# Patient Record
Sex: Female | Born: 1980 | Race: White | Hispanic: No | Marital: Married | Smoking: Former smoker
Health system: Southern US, Community
[De-identification: ages and names within clinical notes are randomized; demographics above are authoritative.]

## PROBLEM LIST (undated history)

## (undated) DIAGNOSIS — N369 Urethral disorder, unspecified: Secondary | ICD-10-CM

## (undated) DIAGNOSIS — N3010 Interstitial cystitis (chronic) without hematuria: Secondary | ICD-10-CM

## (undated) DIAGNOSIS — F329 Major depressive disorder, single episode, unspecified: Secondary | ICD-10-CM

## (undated) DIAGNOSIS — N870 Mild cervical dysplasia: Secondary | ICD-10-CM

## (undated) DIAGNOSIS — K219 Gastro-esophageal reflux disease without esophagitis: Secondary | ICD-10-CM

## (undated) DIAGNOSIS — F419 Anxiety disorder, unspecified: Secondary | ICD-10-CM

## (undated) DIAGNOSIS — F32A Depression, unspecified: Secondary | ICD-10-CM

## (undated) DIAGNOSIS — F988 Other specified behavioral and emotional disorders with onset usually occurring in childhood and adolescence: Secondary | ICD-10-CM

## (undated) HISTORY — DX: Depression, unspecified: F32.A

## (undated) HISTORY — DX: Major depressive disorder, single episode, unspecified: F32.9

## (undated) HISTORY — DX: Interstitial cystitis (chronic) without hematuria: N30.10

## (undated) HISTORY — DX: Anxiety disorder, unspecified: F41.9

## (undated) HISTORY — PX: COLPOSCOPY: SHX161

## (undated) HISTORY — DX: Mild cervical dysplasia: N87.0

## (undated) HISTORY — PX: RHINOPLASTY: SUR1284

## (undated) HISTORY — PX: TONSILLECTOMY: SUR1361

---

## 1998-05-31 ENCOUNTER — Other Ambulatory Visit: Admission: RE | Admit: 1998-05-31 | Discharge: 1998-05-31 | Payer: Self-pay | Admitting: Gynecology

## 1999-07-02 ENCOUNTER — Other Ambulatory Visit: Admission: RE | Admit: 1999-07-02 | Discharge: 1999-07-02 | Payer: Self-pay | Admitting: Gynecology

## 2000-07-13 ENCOUNTER — Other Ambulatory Visit: Admission: RE | Admit: 2000-07-13 | Discharge: 2000-07-13 | Payer: Self-pay | Admitting: Gynecology

## 2001-05-25 ENCOUNTER — Encounter: Payer: Self-pay | Admitting: Gynecology

## 2001-05-25 ENCOUNTER — Encounter: Admission: RE | Admit: 2001-05-25 | Discharge: 2001-05-25 | Payer: Self-pay | Admitting: Gynecology

## 2001-06-21 ENCOUNTER — Encounter: Payer: Self-pay | Admitting: Surgery

## 2001-06-21 ENCOUNTER — Encounter: Admission: RE | Admit: 2001-06-21 | Discharge: 2001-06-21 | Payer: Self-pay | Admitting: Surgery

## 2001-07-13 ENCOUNTER — Other Ambulatory Visit: Admission: RE | Admit: 2001-07-13 | Discharge: 2001-07-13 | Payer: Self-pay | Admitting: Gynecology

## 2001-09-03 ENCOUNTER — Ambulatory Visit (HOSPITAL_BASED_OUTPATIENT_CLINIC_OR_DEPARTMENT_OTHER): Admission: RE | Admit: 2001-09-03 | Discharge: 2001-09-03 | Payer: Self-pay | Admitting: Internal Medicine

## 2002-09-02 ENCOUNTER — Other Ambulatory Visit: Admission: RE | Admit: 2002-09-02 | Discharge: 2002-09-02 | Payer: Self-pay | Admitting: Gynecology

## 2003-06-22 ENCOUNTER — Other Ambulatory Visit: Admission: RE | Admit: 2003-06-22 | Discharge: 2003-06-22 | Payer: Self-pay | Admitting: Gynecology

## 2004-02-28 ENCOUNTER — Other Ambulatory Visit: Admission: RE | Admit: 2004-02-28 | Discharge: 2004-02-28 | Payer: Self-pay | Admitting: Gynecology

## 2004-10-21 ENCOUNTER — Other Ambulatory Visit: Admission: RE | Admit: 2004-10-21 | Discharge: 2004-10-21 | Payer: Self-pay | Admitting: Gynecology

## 2005-03-04 ENCOUNTER — Other Ambulatory Visit: Admission: RE | Admit: 2005-03-04 | Discharge: 2005-03-04 | Payer: Self-pay | Admitting: Gynecology

## 2005-07-15 ENCOUNTER — Ambulatory Visit: Payer: Self-pay | Admitting: Internal Medicine

## 2005-07-17 ENCOUNTER — Ambulatory Visit: Payer: Self-pay | Admitting: Cardiology

## 2006-06-23 ENCOUNTER — Other Ambulatory Visit: Admission: RE | Admit: 2006-06-23 | Discharge: 2006-06-23 | Payer: Self-pay | Admitting: Obstetrics and Gynecology

## 2007-08-26 ENCOUNTER — Other Ambulatory Visit: Admission: RE | Admit: 2007-08-26 | Discharge: 2007-08-26 | Payer: Self-pay | Admitting: Obstetrics and Gynecology

## 2008-01-19 ENCOUNTER — Ambulatory Visit: Payer: Self-pay | Admitting: Obstetrics and Gynecology

## 2009-03-04 ENCOUNTER — Emergency Department (HOSPITAL_COMMUNITY): Admission: EM | Admit: 2009-03-04 | Discharge: 2009-03-04 | Payer: Self-pay | Admitting: Emergency Medicine

## 2009-03-13 ENCOUNTER — Emergency Department (HOSPITAL_COMMUNITY): Admission: EM | Admit: 2009-03-13 | Discharge: 2009-03-13 | Payer: Self-pay | Admitting: Emergency Medicine

## 2009-04-05 ENCOUNTER — Ambulatory Visit: Payer: Self-pay | Admitting: Obstetrics and Gynecology

## 2009-04-05 ENCOUNTER — Other Ambulatory Visit: Admission: RE | Admit: 2009-04-05 | Discharge: 2009-04-05 | Payer: Self-pay | Admitting: Obstetrics and Gynecology

## 2010-04-09 ENCOUNTER — Other Ambulatory Visit (HOSPITAL_COMMUNITY)
Admission: RE | Admit: 2010-04-09 | Payer: PRIVATE HEALTH INSURANCE | Source: Ambulatory Visit | Admitting: Obstetrics and Gynecology

## 2010-04-09 ENCOUNTER — Other Ambulatory Visit
Admission: RE | Admit: 2010-04-09 | Discharge: 2010-04-09 | Payer: Self-pay | Source: Home / Self Care | Admitting: Obstetrics and Gynecology

## 2010-04-09 ENCOUNTER — Ambulatory Visit
Admission: RE | Admit: 2010-04-09 | Discharge: 2010-04-09 | Payer: Self-pay | Source: Home / Self Care | Attending: Obstetrics and Gynecology | Admitting: Obstetrics and Gynecology

## 2010-04-09 ENCOUNTER — Other Ambulatory Visit (HOSPITAL_COMMUNITY)
Admission: RE | Admit: 2010-04-09 | Discharge: 2010-04-09 | Disposition: A | Discharge status: Home / Self Care | Payer: PRIVATE HEALTH INSURANCE | Source: Ambulatory Visit | Attending: Obstetrics and Gynecology | Admitting: Obstetrics and Gynecology

## 2010-04-09 ENCOUNTER — Other Ambulatory Visit: Payer: Self-pay | Admitting: Obstetrics and Gynecology

## 2010-04-09 DIAGNOSIS — Z124 Encounter for screening for malignant neoplasm of cervix: Secondary | ICD-10-CM | POA: Insufficient documentation

## 2010-06-17 LAB — POCT URINALYSIS DIP (DEVICE)
Bilirubin Urine: NEGATIVE
Glucose, UA: NEGATIVE mg/dL
Glucose, UA: NEGATIVE mg/dL
Ketones, ur: NEGATIVE mg/dL
Nitrite: NEGATIVE
Nitrite: POSITIVE — AB
Protein, ur: 30 mg/dL — AB
Protein, ur: >=300 mg/dL — AB
Specific Gravity, Urine: 1.01 (ref 1.005–1.030)
Specific Gravity, Urine: >=1.03 (ref 1.005–1.030)
Urobilinogen, UA: 0.2 mg/dL (ref 0.0–1.0)
Urobilinogen, UA: 0.2 mg/dL (ref 0.0–1.0)
pH: 5.5 (ref 5.0–8.0)
pH: 5.5 (ref 5.0–8.0)

## 2010-06-17 LAB — URINE CULTURE: Colony Count: NO GROWTH

## 2010-06-17 LAB — POCT PREGNANCY, URINE: Preg Test, Ur: NEGATIVE

## 2011-03-21 ENCOUNTER — Telehealth: Payer: Self-pay | Admitting: *Deleted

## 2011-03-21 MED ORDER — NORGESTREL-ETHINYL ESTRADIOL 0.3-30 MG-MCG PO TABS: 1.0000 | ORAL_TABLET | Freq: Every day | ORAL | Status: DC

## 2011-03-21 NOTE — Telephone Encounter (Signed)
Pt called wanting refill on birth control Lo/Ovral 28 tab. Her annual is due Jan, but pt wont be able to make until Feb? Lm for pt to call.

## 2011-03-21 NOTE — Telephone Encounter (Signed)
Pt called back with appointment date and time 2/14 @3 :30 pm, rx will be sent to pharmacy.

## 2011-04-08 DIAGNOSIS — N870 Mild cervical dysplasia: Secondary | ICD-10-CM | POA: Insufficient documentation

## 2011-04-08 DIAGNOSIS — F319 Bipolar disorder, unspecified: Secondary | ICD-10-CM | POA: Insufficient documentation

## 2011-04-21 ENCOUNTER — Other Ambulatory Visit: Payer: Self-pay | Admitting: *Deleted

## 2011-04-21 MED ORDER — NORGESTREL-ETHINYL ESTRADIOL 0.3-30 MG-MCG PO TABS: 1.0000 | ORAL_TABLET | Freq: Every day | ORAL | Status: DC

## 2011-04-21 NOTE — Telephone Encounter (Signed)
Pt called need 1 pack of birth control filled appointment for annual is Feb 14. rx sent to pharmacy.

## 2011-05-01 ENCOUNTER — Encounter: Payer: Self-pay | Admitting: Obstetrics and Gynecology

## 2011-05-01 ENCOUNTER — Ambulatory Visit (INDEPENDENT_AMBULATORY_CARE_PROVIDER_SITE_OTHER): Payer: PRIVATE HEALTH INSURANCE | Admitting: Obstetrics and Gynecology

## 2011-05-01 ENCOUNTER — Other Ambulatory Visit (HOSPITAL_COMMUNITY)
Admission: RE | Admit: 2011-05-01 | Discharge: 2011-05-01 | Disposition: A | Discharge status: Home / Self Care | Payer: PRIVATE HEALTH INSURANCE | Source: Ambulatory Visit | Attending: Obstetrics and Gynecology | Admitting: Obstetrics and Gynecology

## 2011-05-01 DIAGNOSIS — Z113 Encounter for screening for infections with a predominantly sexual mode of transmission: Secondary | ICD-10-CM

## 2011-05-01 DIAGNOSIS — Z01419 Encounter for gynecological examination (general) (routine) without abnormal findings: Secondary | ICD-10-CM | POA: Insufficient documentation

## 2011-05-01 LAB — URINALYSIS W MICROSCOPIC + REFLEX CULTURE
Hgb urine dipstick: NEGATIVE
Leukocytes, UA: NEGATIVE
Nitrite: NEGATIVE
Protein, ur: NEGATIVE mg/dL

## 2011-05-01 MED ORDER — NORGESTREL-ETHINYL ESTRADIOL 0.3-30 MG-MCG PO TABS: 1.0000 | ORAL_TABLET | Freq: Every day | ORAL | Status: DC

## 2011-05-01 NOTE — Progress Notes (Signed)
Patient came to see me today for her annual GYN exam. She remains happy on her birth control pills. 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 She has noticed some diminished libido. She is on a variety of drugs by Dr. Andee Poles which can affect libido. She is having no pelvic pain. She is having no abnormal bleeding.  Physical examination: Kennon Portela present. HEENT within normal limits. Neck: Thyroid not large. No masses. Supraclavicular nodes: not enlarged. Breasts: Examined in both sitting midline position. No skin changes and no masses. Abdomen: Soft no guarding rebound or masses or hernia. Pelvic: External: Within normal limits. BUS: Within normal limits. Vaginal:within normal limits. Good estrogen effect. No evidence of cystocele rectocele or enterocele. Cervix: clean. Uterus: Normal size and shape. Adnexa: No masses. Rectovaginal exam: Confirmatory and negative. Extremities: Within normal limits.  Assessment: Normal GYN exam. History of CIN-1. Diminished libido.  Plan: Patient will discuss her libido with Dr. Nolen Mu. We also discussed a Mirena IUD but I don't believe this is hormonal. The patient liked the thought of an IUD however. She will read about it-literature given. She will inform [place] if she wants to switch. Her birth control pills were refilled.

## 2011-05-09 ENCOUNTER — Telehealth: Payer: Self-pay | Admitting: *Deleted

## 2011-05-09 NOTE — Telephone Encounter (Signed)
Patient informed, will stick with birth control pills for now.

## 2011-05-09 NOTE — Telephone Encounter (Signed)
Message copied by Libby Maw on Fri May 09, 2011 10:36 AM ------      Message from: Aura Camps      Created: Wed May 07, 2011  9:22 AM       Hey Mychal Decarlo            This pt called wanting to know if benefits could be checked for Mirena IUD. Dr.G spoke with her about this at last office visit call back # [phone]            Thanks             Victorino Dike

## 2011-05-09 NOTE — Telephone Encounter (Signed)
Lm for patient to call about Mirena IUD benefits.  All charges would go to $2500 deductable.

## 2012-03-03 ENCOUNTER — Ambulatory Visit (INDEPENDENT_AMBULATORY_CARE_PROVIDER_SITE_OTHER): Payer: PRIVATE HEALTH INSURANCE | Admitting: Gynecology

## 2012-03-03 ENCOUNTER — Encounter: Payer: Self-pay | Admitting: Gynecology

## 2012-03-03 VITALS — BP 132/84

## 2012-03-03 DIAGNOSIS — N920 Excessive and frequent menstruation with regular cycle: Secondary | ICD-10-CM

## 2012-03-03 DIAGNOSIS — Z3041 Encounter for surveillance of contraceptive pills: Secondary | ICD-10-CM

## 2012-03-03 DIAGNOSIS — N946 Dysmenorrhea, unspecified: Secondary | ICD-10-CM

## 2012-03-03 MED ORDER — NORGESTREL-ETHINYL ESTRADIOL 0.3-30 MG-MCG PO TABS: ORAL_TABLET | ORAL | Status: DC

## 2012-03-03 NOTE — Progress Notes (Signed)
Patient is a 31 year old gravida 0 who presented to the office today to discuss contraceptive options. Patient is currently on norgestrel-ethinyl estradiol and states that a few days before her period starts she'll spot for a few days. She had her annual exam earlier this year and my partner had discussed with her about the possibility of using a Mirena IUD for contraception. At that [employer] did not cover for the IUD and  she had decided at that point to continue on the oral contraceptive pill. We discussed the continuous regimen of taking the oral contraceptive pill whereby she would withdraw every 3 months. She would like to proceed with this regimen until she can find out if her insurance will cover for the Mirena IUD. The risks benefits and pros and cons of both options discussed. She's otherwise scheduled to return to the office integrity see her for her annual exam.

## 2012-03-03 NOTE — Patient Instructions (Addendum)
Intrauterine Device Information  An intrauterine device (IUD) is inserted into your uterus and prevents pregnancy. There are 2 types of IUDs available:  · Copper IUD. This type of IUD is wrapped in copper wire and is placed inside the uterus. Copper makes the uterus and fallopian tubes produce a fluid that kills sperm. The copper IUD can stay in place for 10 years.  · Hormone IUD. This type of IUD contains the hormone progestin (synthetic progesterone). The hormone thickens the cervical mucus and prevents sperm from entering the uterus, and it also thins the uterine lining to prevent implantation of a fertilized egg. The hormone can weaken or kill the sperm that get into the uterus. The hormone IUD can stay in place for 5 years.  Your caregiver will make sure you are a good candidate for a contraceptive IUD. Discuss with your caregiver the possible side effects.  ADVANTAGES  · It is highly effective, reversible, long-acting, and low maintenance.  · There are no estrogen-related side effects.  · An IUD can be used when breastfeeding.  · It is not associated with weight gain.  · It works immediately after insertion.  · The copper IUD does not interfere with your female hormones.  · The progesterone IUD can make heavy menstrual periods lighter.  · The progesterone IUD can be used for 5 years.  · The copper IUD can be used for 10 years.  DISADVANTAGES  · The progesterone IUD can be associated with irregular bleeding patterns.  · The copper IUD can make your menstrual flow heavier and more painful.  · You may experience cramping and vaginal bleeding after insertion.  Document Released: 02/05/2004 Document Revised: 05/26/2011 Document Reviewed: 07/06/2010  ExitCare® Patient Information ©2013 [employer].

## 2012-03-04 ENCOUNTER — Other Ambulatory Visit: Payer: Self-pay | Admitting: Gynecology

## 2012-03-04 ENCOUNTER — Telehealth: Payer: Self-pay | Admitting: Gynecology

## 2012-03-04 DIAGNOSIS — Z3049 Encounter for surveillance of other contraceptives: Secondary | ICD-10-CM

## 2012-03-04 MED ORDER — LEVONORGESTREL 20 MCG/24HR IU IUD: INTRAUTERINE_SYSTEM | Freq: Once | INTRAUTERINE | Status: DC

## 2012-03-04 NOTE — Telephone Encounter (Signed)
Pt was advised that the Mirena & insertion were covered with $30 copay since her deductible is met for 2013. Appt set with JF for 03/08/12 at 2:20pm/WL

## 2012-03-08 ENCOUNTER — Encounter: Payer: Self-pay | Admitting: Gynecology

## 2012-03-08 ENCOUNTER — Ambulatory Visit (INDEPENDENT_AMBULATORY_CARE_PROVIDER_SITE_OTHER): Payer: PRIVATE HEALTH INSURANCE | Admitting: Gynecology

## 2012-03-08 VITALS — BP 132/84

## 2012-03-08 DIAGNOSIS — Z30430 Encounter for insertion of intrauterine contraceptive device: Secondary | ICD-10-CM

## 2012-03-08 NOTE — Patient Instructions (Signed)
Intrauterine Device Information  An intrauterine device (IUD) is inserted into your uterus and prevents pregnancy. There are 2 types of IUDs available:  · Copper IUD. This type of IUD is wrapped in copper wire and is placed inside the uterus. Copper makes the uterus and fallopian tubes produce a fluid that kills sperm. The copper IUD can stay in place for 10 years.  · Hormone IUD. This type of IUD contains the hormone progestin (synthetic progesterone). The hormone thickens the cervical mucus and prevents sperm from entering the uterus, and it also thins the uterine lining to prevent implantation of a fertilized egg. The hormone can weaken or kill the sperm that get into the uterus. The hormone IUD can stay in place for 5 years.  Your caregiver will make sure you are a good candidate for a contraceptive IUD. Discuss with your caregiver the possible side effects.  ADVANTAGES  · It is highly effective, reversible, long-acting, and low maintenance.  · There are no estrogen-related side effects.  · An IUD can be used when breastfeeding.  · It is not associated with weight gain.  · It works immediately after insertion.  · The copper IUD does not interfere with your female hormones.  · The progesterone IUD can make heavy menstrual periods lighter.  · The progesterone IUD can be used for 5 years.  · The copper IUD can be used for 10 years.  DISADVANTAGES  · The progesterone IUD can be associated with irregular bleeding patterns.  · The copper IUD can make your menstrual flow heavier and more painful.  · You may experience cramping and vaginal bleeding after insertion.  Document Released: 02/05/2004 Document Revised: 05/26/2011 Document Reviewed: 07/06/2010  ExitCare® Patient Information ©2013 [employer].

## 2012-03-08 NOTE — Progress Notes (Signed)
Patient presented to the office today for placement of her Mirena IUD. See previous encounter note dated December 18 for further detail. Patient read the literature information previously provided. Patient fully where this form of contraception is 99% effective and is good for 5 years. Patient finished her menstrual cycle 4 days ago and started her oral contraceptive pill 3 days ago.  Exam: Bartholin urethra Skene was within normal limits Vagina: No lesions or discharge Cervix: No lesions or discharge Uterus: Anteverted normal size shape and consistency Adnexa: No palpable masses or tenderness Rectal exam: Not done  Procedure note: The cervix was cleansed with Betadine solution. A single-tooth tenaculum was placed on the anterior cervical lip. The uterus sounded to 7 cm. The Mirena IUD was placed in sterile fashion and the string was trimmed. The single-tooth tenaculum was removed. Patient tolerated procedure well.  Assessment/plan: Patient had a Mirena IUD inserted today. Patient will return back in one month for followup. She was instructed to discontinue her oral contraceptive pill. She was given Aleve for cramping in the office.

## 2012-04-08 ENCOUNTER — Encounter: Payer: Self-pay | Admitting: Gynecology

## 2012-04-08 ENCOUNTER — Ambulatory Visit (INDEPENDENT_AMBULATORY_CARE_PROVIDER_SITE_OTHER): Payer: PRIVATE HEALTH INSURANCE | Admitting: Gynecology

## 2012-04-08 VITALS — BP 128/82

## 2012-04-08 DIAGNOSIS — J069 Acute upper respiratory infection, unspecified: Secondary | ICD-10-CM

## 2012-04-08 DIAGNOSIS — Z30431 Encounter for routine checking of intrauterine contraceptive device: Secondary | ICD-10-CM

## 2012-04-08 MED ORDER — HYDROCOD POLST-CHLORPHEN POLST 10-8 MG/5ML PO LQCR: 5.0000 mL | Freq: Two times a day (BID) | ORAL | Status: DC

## 2012-04-08 MED ORDER — FLUCONAZOLE 100 MG PO TABS: ORAL_TABLET | ORAL | Status: DC

## 2012-04-08 MED ORDER — AZITHROMYCIN 250 MG PO TABS: ORAL_TABLET | ORAL | Status: DC

## 2012-04-08 NOTE — Progress Notes (Signed)
Patient presented to the office today for one month followup after having placed a Mirena IUD. Patient has done well. The past few days patient's complaint of postnasal drip sinus congestion and a nonproductive cough.  Exam: Lungs clear to auscultation Rogers or wheezes HEENT: Tender maxillary sinuses no submandibular lymphadenopathy.  Pelvic exam: Bartholin urethra Skene was within normal limits Vagina: No lesions or discharge Cervix: IUD string was seen Uterus: Anteverted normal size shape and consistency nontender Adnexa: No palpable masses or tenderness rectal exam: Not done  Assessment/plan: Upper respiratory tract infection/sinusitis. Patient will be placed on a Z-Pak and a prescription for Tussionex 5 cc every 12 hours for cough suppressant. She scheduled to return back next month for her annual exam. Since patient susceptible to antibiotics and developed these infection a prescription of Diflucan 100 mg was called in as well.

## 2012-04-20 ENCOUNTER — Ambulatory Visit (INDEPENDENT_AMBULATORY_CARE_PROVIDER_SITE_OTHER): Payer: PRIVATE HEALTH INSURANCE | Admitting: Emergency Medicine

## 2012-04-20 ENCOUNTER — Ambulatory Visit: Payer: PRIVATE HEALTH INSURANCE

## 2012-04-20 VITALS — BP 112/76 | HR 96 | Temp 98.1°F | Resp 16 | Ht 65.0 in | Wt 174.0 lb

## 2012-04-20 DIAGNOSIS — M25569 Pain in unspecified knee: Secondary | ICD-10-CM

## 2012-04-20 MED ORDER — NAPROXEN SODIUM 550 MG PO TABS: 550.0000 mg | ORAL_TABLET | Freq: Two times a day (BID) | ORAL | Status: DC

## 2012-04-20 NOTE — Progress Notes (Signed)
  Subjective:    Patient ID: Abigail Higgins, female    DOB: May 23, 1980, 32 y.o.   MRN: [id number]  HPI    Review of Systems     Objective:   Physical Exam        Assessment & Plan:    [facility] reading (PRIMARY) by  Dr. Dareen Piano. Negative knee.

## 2012-04-20 NOTE — Patient Instructions (Addendum)
Knee Pain The knee is the complex joint between your thigh and your lower leg. It is made up of bones, tendons, ligaments, and cartilage. The bones that make up the knee are:  The femur in the thigh.  The tibia and fibula in the lower leg.  The patella or kneecap riding in the groove on the lower femur. CAUSES  Knee pain is a common complaint with many causes. A few of these causes are:  Injury, such as:  A ruptured ligament or tendon injury.  Torn cartilage.  Medical conditions, such as:  Gout  Arthritis  Infections  Overuse, over training or overdoing a physical activity. Knee pain can be minor or severe. Knee pain can accompany debilitating injury. Minor knee problems often respond well to self-care measures or get well on their own. More serious injuries may need medical intervention or even surgery. SYMPTOMS The knee is complex. Symptoms of knee problems can vary widely. Some of the problems are:  Pain with movement and weight bearing.  Swelling and tenderness.  Buckling of the knee.  Inability to straighten or extend your knee.  Your knee locks and you cannot straighten it.  Warmth and redness with pain and fever.  Deformity or dislocation of the kneecap. DIAGNOSIS  Determining what is wrong may be very straight forward such as when there is an injury. It can also be challenging because of the complexity of the knee. Tests to make a diagnosis may include:  Your caregiver taking a history and doing a physical exam.  Routine X-rays can be used to rule out other problems. X-rays will not reveal a cartilage tear. Some injuries of the knee can be diagnosed by:  Arthroscopy a surgical technique by which a small video camera is inserted through tiny incisions on the sides of the knee. This procedure is used to examine and repair internal knee joint problems. Tiny instruments can be used during arthroscopy to repair the torn knee cartilage (meniscus).  Arthrography  is a radiology technique. A contrast liquid is directly injected into the knee joint. Internal structures of the knee joint then become visible on X-ray film.  An MRI scan is a non x-ray radiology procedure in which magnetic fields and a computer produce two- or three-dimensional images of the inside of the knee. Cartilage tears are often visible using an MRI scanner. MRI scans have largely replaced arthrography in diagnosing cartilage tears of the knee.  Blood work.  Examination of the fluid that helps to lubricate the knee joint (synovial fluid). This is done by taking a sample out using a needle and a syringe. TREATMENT The treatment of knee problems depends on the cause. Some of these treatments are:  Depending on the injury, proper casting, splinting, surgery or physical therapy care will be needed.  Give yourself adequate recovery time. Do not overuse your joints. If you begin to get sore during workout routines, back off. Slow down or do fewer repetitions.  For repetitive activities such as cycling or running, maintain your strength and nutrition.  Alternate muscle groups. For example if you are a weight lifter, work the upper body on one day and the lower body the next.  Either tight or weak muscles do not give the proper support for your knee. Tight or weak muscles do not absorb the stress placed on the knee joint. Keep the muscles surrounding the knee strong.  Take care of mechanical problems.  If you have flat feet, orthotics or special shoes may help.   See your caregiver if you need help.  Arch supports, sometimes with wedges on the inner or outer aspect of the heel, can help. These can shift pressure away from the side of the knee most bothered by osteoarthritis.  A brace called an "unloader" brace also may be used to help ease the pressure on the most arthritic side of the knee.  If your caregiver has prescribed crutches, braces, wraps or ice, use as directed. The acronym for  this is PRICE. This means protection, rest, ice, compression and elevation.  Nonsteroidal anti-inflammatory drugs (NSAID's), can help relieve pain. But if taken immediately after an injury, they may actually increase swelling. Take NSAID's with food in your stomach. Stop them if you develop stomach problems. Do not take these if you have a history of ulcers, stomach pain or bleeding from the bowel. Do not take without your caregiver's approval if you have problems with fluid retention, heart failure, or kidney problems.  For ongoing knee problems, physical therapy may be helpful.  Glucosamine and chondroitin are over-the-counter dietary supplements. Both may help relieve the pain of osteoarthritis in the knee. These medicines are different from the usual anti-inflammatory drugs. Glucosamine may decrease the rate of cartilage destruction.  Injections of a corticosteroid drug into your knee joint may help reduce the symptoms of an arthritis flare-up. They may provide pain relief that lasts a few months. You may have to wait a few months between injections. The injections do have a small increased risk of infection, water retention and elevated blood sugar levels.  Hyaluronic acid injected into damaged joints may ease pain and provide lubrication. These injections may work by reducing inflammation. A series of shots may give relief for as long as 6 months.  Topical painkillers. Applying certain ointments to your skin may help relieve the pain and stiffness of osteoarthritis. Ask your pharmacist for suggestions. Many over the-counter products are approved for temporary relief of arthritis pain.  In some countries, doctors often prescribe topical NSAID's for relief of chronic conditions such as arthritis and tendinitis. A review of treatment with NSAID creams found that they worked as well as oral medications but without the serious side effects. PREVENTION  Maintain a healthy weight. Extra pounds put  more strain on your joints.  Get strong, stay limber. Weak muscles are a common cause of knee injuries. Stretching is important. Include flexibility exercises in your workouts.  Be smart about exercise. If you have osteoarthritis, chronic knee pain or recurring injuries, you may need to change the way you exercise. This does not mean you have to stop being active. If your knees ache after jogging or playing basketball, consider switching to swimming, water aerobics or other low-impact activities, at least for a few days a week. Sometimes limiting high-impact activities will provide relief.  Make sure your shoes fit well. Choose footwear that is right for your sport.  Protect your knees. Use the proper gear for knee-sensitive activities. Use kneepads when playing volleyball or laying carpet. Buckle your seat belt every time you drive. Most shattered kneecaps occur in car accidents.  Rest when you are tired. SEEK MEDICAL CARE IF:  You have knee pain that is continual and does not seem to be getting better.  SEEK IMMEDIATE MEDICAL CARE IF:  Your knee joint feels hot to the touch and you have a high fever. MAKE SURE YOU:   Understand these instructions.  Will watch your condition.  Will get help right away if you are not   doing well or get worse. Document Released: 12/29/2006 Document Revised: 05/26/2011 Document Reviewed: 12/29/2006 ExitCare Patient Information 2013 [employer].  

## 2012-04-20 NOTE — Progress Notes (Signed)
[facility] [address], [place] [address] [phone]  Date:  04/20/2012   Name:  Abigail Higgins   DOB:  06-21-1980   MRN:  [id number]  PCP:  Tonye Pearson, MD    Chief Complaint: Knee Injury   History of Present Illness:  Abigail Higgins is a 31 y.o. very pleasant female patient who presents with the following:  Injured right knee in a fall on the stairs when she missed a step and fell down the last three steps.  Happened last Tuesday and still has pain.  No improvement with OTC meds.  Unable to comfortably bear weight.  No other complaint of injury.  Patient Active Problem List  Diagnosis  . Depression  . CIN I (cervical intraepithelial neoplasia I)    Past Medical History  Diagnosis Date  . Depression   . CIN I (cervical intraepithelial neoplasia I)     Past Surgical History  Procedure Date  . Tonsillectomy   . Rhinoplasty   . Colposcopy     History  Substance Use Topics  . Smoking status: Current Every Day Smoker -- 0.5 packs/day    Types: Cigarettes  . Smokeless tobacco: Not on file  . Alcohol Use: No    Family History  Problem Relation Age of Onset  . Adopted: Yes    No Known Allergies  Medication list has been reviewed and updated.  Current Outpatient Prescriptions on File Prior to Visit  Medication Sig Dispense Refill  . ALPRAZolam (XANAX) 0.25 MG tablet Take 0.25 mg by mouth at bedtime as needed.      . ARIPiprazole (ABILIFY) 10 MG tablet Take 10 mg by mouth daily.      . ClonazePAM (KLONOPIN PO) Take by mouth.      . lamoTRIgine (LAMICTAL) 100 MG tablet Take 100 mg by mouth daily.       Marland Kitchen levonorgestrel (MIRENA) 20 MCG/24HR IUD 1 each by Intrauterine route once.      . lisdexamfetamine (VYVANSE) 50 MG capsule Take 50 mg by mouth every morning.      Marland Kitchen QUEtiapine Fumarate (SEROQUEL PO) Take by mouth.      Marland Kitchen azithromycin (ZITHROMAX) 250 MG tablet Take two tablets today then one PO BID  6 each  0  .  chlorpheniramine-HYDROcodone (TUSSIONEX) 10-8 MG/5ML LQCR Take 5 mLs by mouth every 12 (twelve) hours.  140 mL  0  . fluconazole (DIFLUCAN) 100 MG tablet Take one tablet and repeat in 48 hours if needed  2 tablet  1  . norgestrel-ethinyl estradiol (LO/OVRAL,CRYSELLE) 0.3-30 MG-MCG tablet Please take one continuously  and withdraw every three months. The first and second packs discard the last seven tablets.  3 Package  4    Review of Systems:  As per HPI, otherwise negative.    Physical Examination: Filed Vitals:   04/20/12 1356  BP: 112/76  Pulse: 96  Temp: 98.1 F (36.7 C)  Resp: 16   Filed Vitals:   04/20/12 1356  Height: 5\' 5"  (1.651 m)  Weight: 174 lb (78.926 kg)   Body mass index is 28.96 kg/(m^2). Ideal Body Weight: Weight in (lb) to have BMI = 25: 149.9    GEN: WDWN, NAD, Non-toxic, Alert & Oriented x 3 HEENT: Atraumatic, Normocephalic.  Ears and Nose: No external deformity. EXTR: No clubbing/cyanosis/edema NEURO: Normal gait.  PSYCH: Normally interactive. Conversant. Not depressed or anxious appearing.  Calm demeanor.  RIGHT Knee:  Tender medially.  No effusion or ecchymosis.  Joint stable.  Assessment and Plan: Contusion knee Anaprox Follow up as needed Refused crutches Follow up in one week  Carmelina Dane, MD

## 2012-04-21 ENCOUNTER — Encounter: Payer: Self-pay | Admitting: Emergency Medicine

## 2012-05-03 ENCOUNTER — Encounter: Payer: PRIVATE HEALTH INSURANCE | Admitting: Gynecology

## 2012-05-18 ENCOUNTER — Encounter: Payer: Self-pay | Admitting: Gynecology

## 2012-05-18 ENCOUNTER — Ambulatory Visit (INDEPENDENT_AMBULATORY_CARE_PROVIDER_SITE_OTHER): Payer: PRIVATE HEALTH INSURANCE | Admitting: Gynecology

## 2012-05-18 VITALS — BP 128/76 | Ht 65.0 in | Wt 164.0 lb

## 2012-05-18 DIAGNOSIS — Z23 Encounter for immunization: Secondary | ICD-10-CM

## 2012-05-18 DIAGNOSIS — Z0289 Encounter for other administrative examinations: Secondary | ICD-10-CM

## 2012-05-18 DIAGNOSIS — R635 Abnormal weight gain: Secondary | ICD-10-CM | POA: Insufficient documentation

## 2012-05-18 DIAGNOSIS — Z01419 Encounter for gynecological examination (general) (routine) without abnormal findings: Secondary | ICD-10-CM

## 2012-05-18 DIAGNOSIS — Z0282 Encounter for adoption services: Secondary | ICD-10-CM

## 2012-05-18 LAB — CBC WITH DIFFERENTIAL/PLATELET
Basophils Relative: 0 % (ref 0–1)
HCT: 41.1 % (ref 36.0–46.0)
Hemoglobin: 14.1 g/dL (ref 12.0–15.0)
Lymphs Abs: 2.9 10*3/uL (ref 0.7–4.0)
MCH: 31.2 pg (ref 26.0–34.0)
MCHC: 34.3 g/dL (ref 30.0–36.0)
Monocytes Absolute: 0.4 10*3/uL (ref 0.1–1.0)
Monocytes Relative: 4 % (ref 3–12)
Neutro Abs: 5 10*3/uL (ref 1.7–7.7)
RBC: 4.52 MIL/uL (ref 3.87–5.11)

## 2012-05-18 LAB — CHOLESTEROL, TOTAL: Cholesterol: 241 mg/dL — ABNORMAL HIGH (ref 0–200)

## 2012-05-18 NOTE — Progress Notes (Signed)
SPOTTING ONLY WITH IUD

## 2012-05-18 NOTE — Patient Instructions (Addendum)

## 2012-05-18 NOTE — Progress Notes (Signed)
Abigail Higgins Jan 02, 1981 [id number]   History:    32 y.o.  for annual gyn exam and complaint is for the 6 months when she voids she states that her stream goes in different direction and is a high gene problem. She does not have stress urinary incontinence. She has no dysuria or frequency. Patient had a Mirena IUD placed in December 2013 and has done well. Patient has a past history of CIN-1 and her followup Pap smears for the past 4 years of been negative the last one being in 2013. She has gained approximately 20 pounds since last year. She frequently does her self breast examination. She is being followed by Dr. Andee Poles for her depression (see medication list in epic). Patient is adopted and has no information on her family history. Patient has completed the HPV vaccine in the past.  Past medical history,surgical history, family history and social history were all reviewed and documented in the EPIC chart.  Gynecologic History Patient's last menstrual period was 04/02/2012. Contraception: IUD Last Pap: 2013. Results were: normal Last mammogram: not indicated. Results were: not indicated  Obstetric History OB History   Grav Para Term Preterm Abortions TAB SAB Ect Mult Living   0                ROS: A ROS was performed and pertinent positives and negatives are included in the history.  GENERAL: No fevers or chills. HEENT: No change in vision, no earache, sore throat or sinus congestion. NECK: No pain or stiffness. CARDIOVASCULAR: No chest pain or pressure. No palpitations. PULMONARY: No shortness of breath, cough or wheeze. GASTROINTESTINAL: No abdominal pain, nausea, vomiting or diarrhea, melena or bright red blood per rectum. GENITOURINARY: No urinary frequency, urgency, hesitancy or dysuria. MUSCULOSKELETAL: No joint or muscle pain, no back pain, no recent trauma. DERMATOLOGIC: No rash, no itching, no lesions. ENDOCRINE: No polyuria, polydipsia, no heat or cold intolerance. No  recent change in weight. HEMATOLOGICAL: No anemia or easy bruising or bleeding. NEUROLOGIC: No headache, seizures, numbness, tingling or weakness. PSYCHIATRIC: No depression, no loss of interest in normal activity or change in sleep pattern.     Exam: chaperone present  BP 128/76  Ht 5\' 5"  (1.651 m)  Wt 164 lb (74.39 kg)  BMI 27.29 kg/m2  LMP 04/02/2012  Body mass index is 27.29 kg/(m^2).  General appearance : Well developed well nourished female. No acute distress HEENT: Neck supple, trachea midline, no carotid bruits, no thyroidmegaly Lungs: Clear to auscultation, no rhonchi or wheezes, or rib retractions  Heart: Regular rate and rhythm, no murmurs or gallops Breast:Examined in sitting and supine position were symmetrical in appearance, no palpable masses or tenderness,  no skin retraction, no nipple inversion, no nipple discharge, no skin discoloration, no axillary or supraclavicular lymphadenopathy Abdomen: no palpable masses or tenderness, no rebound or guarding Extremities: no edema or skin discoloration or tenderness  Pelvic:  Bartholin, Urethra, Skene Glands: Within normal limits             Vagina: No gross lesions or discharge  Cervix: No gross lesions or discharge, IUD string seen  Uterus  anteverted, normal size, shape and consistency, non-tender and mobile  Adnexa  Without masses or tenderness  Anus and perineum  normal   Rectovaginal  normal sphincter tone without palpated masses or tenderness             Hemoccult not indicated   On close inspection there appears to be no urethral diverticulum. There  is no evidence of cystocele. A sterile Q-tip impregnated with 2% lidocaine was placed in her urethra with minimal resistance. UV angle on Valsalva was less than 30 degrees.  Assessment/Plan:  32 y.o. female for annual exam will be referred to the urologist for further evaluation of her abnormal urine stream. She may need cystoscopy for further evaluation. The following  labs were ordered: TSH, hemoglobin A1c, CBC, urinalysis and screening cholesterol. No Pap smear done the new guidelines were discussed. She was reminded to do her monthly self breast examination. We will see her in one year or when necessary.    Ok Edwards MD, 6:31 PM 05/18/2012

## 2012-05-19 ENCOUNTER — Other Ambulatory Visit: Payer: Self-pay | Admitting: Gynecology

## 2012-05-19 DIAGNOSIS — E7800 Pure hypercholesterolemia, unspecified: Secondary | ICD-10-CM

## 2012-05-19 LAB — HEMOGLOBIN A1C: Mean Plasma Glucose: 111 mg/dL (ref <117)

## 2012-05-20 ENCOUNTER — Encounter: Payer: Self-pay | Admitting: *Deleted

## 2012-05-20 NOTE — Progress Notes (Signed)
Patient ID: Abigail Higgins, female   DOB: Jan 03, 1981, 32 y.o.   MRN: [id number] Notes and labs faxed to Dr.Tannenbaum office pt has appt. On 06/21/12.

## 2012-06-03 ENCOUNTER — Other Ambulatory Visit: Payer: PRIVATE HEALTH INSURANCE

## 2012-06-03 DIAGNOSIS — E7800 Pure hypercholesterolemia, unspecified: Secondary | ICD-10-CM

## 2012-06-03 LAB — LIPID PANEL
HDL: 72 mg/dL (ref >39)
Total CHOL/HDL Ratio: 3 Ratio
Triglycerides: 69 mg/dL (ref <150)

## 2012-06-28 ENCOUNTER — Other Ambulatory Visit: Payer: Self-pay | Admitting: Urology

## 2012-07-29 ENCOUNTER — Encounter (HOSPITAL_BASED_OUTPATIENT_CLINIC_OR_DEPARTMENT_OTHER): Payer: Self-pay | Admitting: *Deleted

## 2012-07-29 NOTE — Progress Notes (Signed)
NPO AFTER MN. ARRIVES AT 0730. NEEDS HG AND URINE PREG.

## 2012-08-02 ENCOUNTER — Ambulatory Visit (HOSPITAL_BASED_OUTPATIENT_CLINIC_OR_DEPARTMENT_OTHER)
Admission: RE | Admit: 2012-08-02 | Discharge: 2012-08-02 | Disposition: A | Discharge status: Home / Self Care | Payer: PRIVATE HEALTH INSURANCE | Source: Ambulatory Visit | Attending: Urology | Admitting: Urology

## 2012-08-02 ENCOUNTER — Encounter (HOSPITAL_BASED_OUTPATIENT_CLINIC_OR_DEPARTMENT_OTHER): Payer: Self-pay | Admitting: Anesthesiology

## 2012-08-02 ENCOUNTER — Ambulatory Visit (HOSPITAL_BASED_OUTPATIENT_CLINIC_OR_DEPARTMENT_OTHER): Payer: PRIVATE HEALTH INSURANCE | Admitting: Anesthesiology

## 2012-08-02 ENCOUNTER — Encounter (HOSPITAL_BASED_OUTPATIENT_CLINIC_OR_DEPARTMENT_OTHER)
Admission: RE | Disposition: A | Discharge status: Home / Self Care | Payer: Self-pay | Source: Ambulatory Visit | Attending: Urology

## 2012-08-02 DIAGNOSIS — F411 Generalized anxiety disorder: Secondary | ICD-10-CM | POA: Insufficient documentation

## 2012-08-02 DIAGNOSIS — N3010 Interstitial cystitis (chronic) without hematuria: Secondary | ICD-10-CM | POA: Insufficient documentation

## 2012-08-02 DIAGNOSIS — F17200 Nicotine dependence, unspecified, uncomplicated: Secondary | ICD-10-CM | POA: Insufficient documentation

## 2012-08-02 DIAGNOSIS — R3913 Splitting of urinary stream: Secondary | ICD-10-CM | POA: Insufficient documentation

## 2012-08-02 DIAGNOSIS — F3289 Other specified depressive episodes: Secondary | ICD-10-CM | POA: Insufficient documentation

## 2012-08-02 DIAGNOSIS — R3915 Urgency of urination: Secondary | ICD-10-CM | POA: Insufficient documentation

## 2012-08-02 DIAGNOSIS — F329 Major depressive disorder, single episode, unspecified: Secondary | ICD-10-CM | POA: Insufficient documentation

## 2012-08-02 HISTORY — DX: Gastro-esophageal reflux disease without esophagitis: K21.9

## 2012-08-02 HISTORY — DX: Other specified behavioral and emotional disorders with onset usually occurring in childhood and adolescence: F98.8

## 2012-08-02 HISTORY — PX: CYSTOSCOPY WITH URETHRAL DILATATION: SHX5125

## 2012-08-02 HISTORY — DX: Urethral disorder, unspecified: N36.9

## 2012-08-02 LAB — POCT PREGNANCY, URINE: Preg Test, Ur: NEGATIVE

## 2012-08-02 SURGERY — CYSTOSCOPY, WITH URETHRAL DILATION
Anesthesia: General | Site: Bladder | Wound class: Clean Contaminated

## 2012-08-02 MED ORDER — BELLADONNA ALKALOIDS-OPIUM 16.2-60 MG RE SUPP
RECTAL | Status: DC | PRN
  Administered 2012-08-02: 1 via RECTAL

## 2012-08-02 MED ORDER — PHENAZOPYRIDINE HCL 200 MG PO TABS: 200.0000 mg | ORAL_TABLET | Freq: Three times a day (TID) | ORAL | Status: DC | PRN

## 2012-08-02 MED ORDER — MIDAZOLAM HCL 5 MG/5ML IJ SOLN
INTRAMUSCULAR | Status: DC | PRN
  Administered 2012-08-02: 2 mg via INTRAVENOUS

## 2012-08-02 MED ORDER — KETOROLAC TROMETHAMINE 30 MG/ML IJ SOLN
INTRAMUSCULAR | Status: DC | PRN
  Administered 2012-08-02: 30 mg via INTRAVENOUS

## 2012-08-02 MED ORDER — TRAMADOL-ACETAMINOPHEN 37.5-325 MG PO TABS: 1.0000 | ORAL_TABLET | Freq: Four times a day (QID) | ORAL | Status: DC | PRN

## 2012-08-02 MED ORDER — ACETAMINOPHEN 10 MG/ML IV SOLN
INTRAVENOUS | Status: DC | PRN
  Administered 2012-08-02: 1000 mg via INTRAVENOUS

## 2012-08-02 MED ORDER — FENTANYL CITRATE 0.05 MG/ML IJ SOLN
INTRAMUSCULAR | Status: DC | PRN
  Administered 2012-08-02 (×2): 50 ug via INTRAVENOUS

## 2012-08-02 MED ORDER — PROMETHAZINE HCL 25 MG/ML IJ SOLN
6.2500 mg | INTRAMUSCULAR | Status: DC | PRN
  Filled 2012-08-02: qty 1

## 2012-08-02 MED ORDER — PROPOFOL 10 MG/ML IV BOLUS
INTRAVENOUS | Status: DC | PRN
  Administered 2012-08-02: 200 mg via INTRAVENOUS

## 2012-08-02 MED ORDER — ONDANSETRON HCL 4 MG/2ML IJ SOLN
INTRAMUSCULAR | Status: DC | PRN
  Administered 2012-08-02: 4 mg via INTRAVENOUS

## 2012-08-02 MED ORDER — DEXAMETHASONE SODIUM PHOSPHATE 4 MG/ML IJ SOLN
INTRAMUSCULAR | Status: DC | PRN
  Administered 2012-08-02: 10 mg via INTRAVENOUS

## 2012-08-02 MED ORDER — STERILE WATER FOR IRRIGATION IR SOLN
Status: DC | PRN
  Administered 2012-08-02: 3000 mL

## 2012-08-02 MED ORDER — LACTATED RINGERS IV SOLN
INTRAVENOUS | Status: DC
  Filled 2012-08-02: qty 1000

## 2012-08-02 MED ORDER — LACTATED RINGERS IV SOLN
INTRAVENOUS | Status: DC
  Administered 2012-08-02: 100 mL/h via INTRAVENOUS
  Filled 2012-08-02: qty 1000

## 2012-08-02 MED ORDER — CEFAZOLIN SODIUM-DEXTROSE 2-3 GM-% IV SOLR
2.0000 g | INTRAVENOUS | Status: AC
  Administered 2012-08-02: 2 g via INTRAVENOUS
  Filled 2012-08-02: qty 50

## 2012-08-02 MED ORDER — PHENAZOPYRIDINE HCL 200 MG PO TABS
200.0000 mg | ORAL_TABLET | Freq: Three times a day (TID) | ORAL | Status: DC
  Administered 2012-08-02: 200 mg via ORAL
  Filled 2012-08-02: qty 1

## 2012-08-02 MED ORDER — LIDOCAINE HCL (CARDIAC) 20 MG/ML IV SOLN
INTRAVENOUS | Status: DC | PRN
  Administered 2012-08-02: 100 mg via INTRAVENOUS

## 2012-08-02 MED ORDER — FENTANYL CITRATE 0.05 MG/ML IJ SOLN
25.0000 ug | INTRAMUSCULAR | Status: DC | PRN
  Filled 2012-08-02: qty 1

## 2012-08-02 MED ORDER — MEPERIDINE HCL 25 MG/ML IJ SOLN
6.2500 mg | INTRAMUSCULAR | Status: DC | PRN
  Filled 2012-08-02: qty 1

## 2012-08-02 MED ORDER — PHENAZOPYRIDINE HCL 200 MG PO TABS
ORAL | Status: DC | PRN
  Administered 2012-08-02: 09:00:00 via INTRAVESICAL

## 2012-08-02 SURGICAL SUPPLY — 29 items
ADAPTER CATH URET PLST 4-6FR (CATHETERS) IMPLANT
ADPR CATH URET STRL DISP 4-6FR (CATHETERS)
BAG DRAIN URO-CYSTO SKYTR STRL (DRAIN) ×2 IMPLANT
BAG DRN UROCATH (DRAIN) ×1
BALLN NEPHROSTOMY (BALLOONS)
BALLOON NEPHROSTOMY (BALLOONS) IMPLANT
BOOTIES KNEE HIGH SLOAN (MISCELLANEOUS) ×2 IMPLANT
CANISTER SUCT LVC 12 LTR MEDI- (MISCELLANEOUS) ×1 IMPLANT
CATH FOLEY 2W COUNCIL 20FR 5CC (CATHETERS) IMPLANT
CATH INTERMIT  6FR 70CM (CATHETERS) IMPLANT
CATH ROBINSON RED A/P 16FR (CATHETERS) ×1 IMPLANT
CATH URET 5FR 28IN CONE TIP (BALLOONS)
CATH URET 5FR 28IN OPEN ENDED (CATHETERS) IMPLANT
CATH URET 5FR 70CM CONE TIP (BALLOONS) IMPLANT
CLOTH BEACON ORANGE TIMEOUT ST (SAFETY) ×2 IMPLANT
DRAPE CAMERA CLOSED 9X96 (DRAPES) ×2 IMPLANT
GLOVE BIO SURGEON STRL SZ 6.5 (GLOVE) ×1 IMPLANT
GLOVE BIO SURGEON STRL SZ7.5 (GLOVE) ×2 IMPLANT
GLOVE ECLIPSE 6.5 STRL STRAW (GLOVE) ×1 IMPLANT
GLOVE ECLIPSE 7.0 STRL STRAW (GLOVE) ×1 IMPLANT
GOWN STRL REIN XL XLG (GOWN DISPOSABLE) ×2 IMPLANT
GOWN XL W/COTTON TOWEL STD (GOWNS) ×2 IMPLANT
GUIDEWIRE 0.038 PTFE COATED (WIRE) IMPLANT
GUIDEWIRE ANG ZIPWIRE 038X150 (WIRE) IMPLANT
GUIDEWIRE STR DUAL SENSOR (WIRE) ×2 IMPLANT
NS IRRIG 500ML POUR BTL (IV SOLUTION) IMPLANT
PACK CYSTOSCOPY (CUSTOM PROCEDURE TRAY) ×2 IMPLANT
SYRINGE IRR TOOMEY STRL 70CC (SYRINGE) IMPLANT
WATER STERILE IRR 3000ML UROMA (IV SOLUTION) ×2 IMPLANT

## 2012-08-02 NOTE — Transfer of Care (Signed)
Immediate Anesthesia Transfer of Care Note  Patient: Abigail Higgins  Procedure(s) Performed: Procedure(s) (LRB): CYSTOSCOPY WITH URETHRAL DILATATION WITH INSTILLATION OF PYRIDIUM AND PELVIC EXAM (N/A)  Patient Location: PACU  Anesthesia Type: General  Level of Consciousness: awake, alert  and oriented  Airway & Oxygen Therapy: Patient Spontanous Breathing and Patient connected to face mask oxygen, oral airway out upon arrival to PACU.  Post-op Assessment: Report given to PACU RN and Post -op Vital signs reviewed and stable  Post vital signs: Reviewed and stable  Complications: No apparent anesthesia complications

## 2012-08-02 NOTE — Op Note (Signed)
Pre-operative diagnosis :   Urethra is, interstitial cystitis  Postoperative diagnosis:  Same  Operation:   Urethral dilation, cystourethroscopy, hydrodistention the bladder (750 cc) instillation of Pyridium in the bladder.  Surgeon:  Kathie Rhodes. Patsi Sears, MD  First assistant:  None  Anesthesia:  General LMA  Preparation:  After appropriate preanesthesia, the patient was brought the operating room, placed on the operating table in the dorsal supine position where general LMA anesthesia was introduced. She was replaced in the dorsal lithotomy position with pubis was prepped with Betadine solution and draped in usual fashion.  Armband was double checked, history was reviewed.  Review history:  Feelings Of Urinary Urgency 788.63 1  2. Urethritis 597.80 1  3. Urine Stream Sprays Or Splits 788.61 2  1. Amended By: [name]; 06/28/2012 9:33 AMEST  2. Amended By: [name]; 06/28/2012 9:34 AMEST  History of Present Illness  32 yo female, daughter of Dr. [name], referred by Dr. [name] for further evaluation of urinary stream spraying x 1 yeqr, worsening in the last 6 months. Occasional sensation of clugh incontinence. .  Note her hx of ADD, and bipolar depression, anxiety, treated and controlled wiht abilify, seroquel, lamictel, clonazepam, alprozolam, and vivance. She is living independently with her boyfriend for 3 months. No dyspaurnea.  Past Medical History  Problems  1. History of Anxiety (Symptom) 300.00  2. History of Depression 311  3. History of Hypercholesterolemia 272.0   Statement of  Likelihood of Success: Excellent. TIME-OUT observed.:  Procedure:  Pelvic examination revealed normal-appearing urethra, normal vaginal examination. The bladder was in normal position. The cervix was in normal position. The posterior vagina was in normal position. Rectum was in normal position.  Cystourethroscopy was accomplished, and showed normal external urethral  meatus. The bladder neck was in normal position, but there were multiple inflammatory polyps noted in photodocumentation. The trigone was in normal position, and clear reflux was seen from both cortices, located in normal position on the trigone. The bladder was noted to hold 700 cc of saline on hydrodistention, and was able to be distended to center 50 cc. The bladder is drained of fluid. The urethra was dilated to a 59 [place] with the female urethral sounds. A red Robinson catheter, size 18 was passed in the bladder, and Pyridium solution was placed in the bladder. The patient received a B. and O. suppository. She received IV Tylenol at the beginning of the procedure, and IV Toradol at the end of procedure. Just received IV antibiotic. She was awakened, taken to recovery room in excellent condition.

## 2012-08-02 NOTE — Interval H&P Note (Signed)
History and Physical Interval Note:  08/02/2012 8:55 AM  Abigail Higgins  has presented today for surgery, with the diagnosis of URETHRAL SPRAYING  The various methods of treatment have been discussed with the patient and family. After consideration of risks, benefits and other options for treatment, the patient has consented to  Procedure(s): CYSTOSCOPY WITH URETHRAL DILATATION WITH INSTILLATION OF PYRIDIUM AND PELVIC EXAM (N/A) as a surgical intervention .  The patient's history has been reviewed, patient examined, no change in status, stable for surgery.  I have reviewed the patient's chart and labs.  Questions were answered to the patient's satisfaction.     [name] I

## 2012-08-02 NOTE — Anesthesia Postprocedure Evaluation (Signed)
  Anesthesia Post-op Note  Patient: Abigail Higgins  Procedure(s) Performed: Procedure(s) (LRB): CYSTOSCOPY WITH URETHRAL DILATATION WITH INSTILLATION OF PYRIDIUM AND PELVIC EXAM (N/A)  Patient Location: PACU  Anesthesia Type: General  Level of Consciousness: awake and alert   Airway and Oxygen Therapy: Patient Spontanous Breathing  Post-op Pain: mild  Post-op Assessment: Post-op Vital signs reviewed, Patient's Cardiovascular Status Stable, Respiratory Function Stable, Patent Airway and No signs of Nausea or vomiting  Last Vitals:  Filed Vitals:   08/02/12 1015  BP: 128/91  Pulse: 78  Temp:   Resp: 17    Post-op Vital Signs: stable   Complications: No apparent anesthesia complications

## 2012-08-02 NOTE — H&P (Signed)
   Complaint  cc:  [name], MD   Reason For Visit  Urinary stream spraying   Active Problems Problems  1. Feelings Of Urinary Urgency 788.63 1  2. Urethritis 597.80 1  3. Urine Stream Sprays Or Splits 788.61 2   1. Amended By: [name]; 06/28/2012 9:33 AMEST  2. Amended By: [name]; 06/28/2012 9:34 AMEST   History of Present Illness       32 yo female, daughter of Dr. [name], referred by Dr. [name] for further evaluation of urinary stream spraying x 1 yeqr, worsening in the last 6 months. Occasional sensation of clugh incontinence. .   Note her hx of ADD, and bipolar depression, anxiety, treated and controlled wiht abilify, seroquel, lamictel, clonazepam, alprozolam, and vivance.  She is living independently with her boyfriend for 3 months. No dyspaurnea.   Past Medical History Problems  1. History of  Anxiety (Symptom) 300.00 2. History of  Depression 311 3. History of  Hypercholesterolemia 272.0  Surgical History Problems  1. History of  Rhinoplasty  Current Meds 1. Mirena 20 MCG/24HR Intrauterine Intrauterine Device; Therapy: (Recorded:04Apr2014) to  Allergies Medication  1. No Known Drug Allergies  Family History Problems  1. 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  Genitourinary: dysuria, urinary stream starts and stops and initiating urination requires straining.  Gastrointestinal: constipation.  Psychiatric: depression and anxiety.    Vitals Vital  Signs [Data Includes: Last 1 Day]  07Apr2014 01:44PM  BMI Calculated: 30.16 BSA Calculated: 1.9 Height: 5 ft 5 in Weight: 181 lb  Blood Pressure: 121 / 80 Heart Rate: 93  Physical Exam Constitutional: Well nourished and well developed . No acute distress.  ENT:. The ears and nose are normal in appearance.  Neck: The appearance of the neck is normal and no neck mass is present.  Pulmonary: No respiratory distress and normal respiratory rhythm and effort.  Cardiovascular:. No peripheral edema.  Skin: Normal skin turgor, no visible rash and no visible skin lesions.  Neuro/Psych:. Mood and affect are appropriate.    Results/Data Urine [Data Includes: Last 1 Day]   07Apr2014  COLOR YELLOW   APPEARANCE CLEAR   SPECIFIC GRAVITY 1.020   pH 7.0   GLUCOSE NEG mg/dL  BILIRUBIN NEG   KETONE NEG mg/dL  BLOOD NEG   PROTEIN NEG mg/dL  UROBILINOGEN 0.2 mg/dL  NITRITE NEG   LEUKOCYTE ESTERASE NEG    Assessment  32 yo female with spraying of urine from unknown cause. She needs pelvic exam and cysto with IV sedation. Tramautized by [facility] exam.   Plan Health Maintenance (V70.0)  1. UA With REFLEX  Done: 07Apr2014 01:19PM      Cysto with IV sedation, and pyridium instillation. .   Signatures Electronically signed by : [name], M.D.; Jun 28 2012  9:34AM

## 2012-08-02 NOTE — Anesthesia Preprocedure Evaluation (Addendum)
Anesthesia Evaluation  Patient identified by MRN, date of birth, ID band Patient awake    Reviewed: Allergy & Precautions, H&P , NPO status , Patient's Chart, lab work & pertinent test results  Airway Mallampati: II TM Distance: >3 FB Neck ROM: Full    Dental no notable dental hx.    Pulmonary Current Smoker,  breath sounds clear to auscultation  Pulmonary exam normal       Cardiovascular negative cardio ROS  Rhythm:Regular Rate:Normal     Neuro/Psych PSYCHIATRIC DISORDERS Anxiety Depression negative neurological ROS  negative psych ROS   GI/Hepatic negative GI ROS, Neg liver ROS,   Endo/Other  negative endocrine ROS  Renal/GU negative Renal ROS  negative genitourinary   Musculoskeletal negative musculoskeletal ROS (+)   Abdominal   Peds negative pediatric ROS (+)  Hematology negative hematology ROS (+)   Anesthesia Other Findings   Reproductive/Obstetrics negative OB ROS                           Anesthesia Physical Anesthesia Plan  ASA: II  Anesthesia Plan: General   Post-op Pain Management:    Induction: Intravenous  Airway Management Planned: LMA  Additional Equipment:   Intra-op Plan:   Post-operative Plan: Extubation in OR  Informed Consent: I have reviewed the patients History and Physical, chart, labs and discussed the procedure including the risks, benefits and alternatives for the proposed anesthesia with the patient or authorized representative who has indicated his/her understanding and acceptance.   Dental advisory given  Plan Discussed with: CRNA  Anesthesia Plan Comments:         Anesthesia Quick Evaluation

## 2012-08-02 NOTE — Anesthesia Procedure Notes (Signed)
Procedure Name: LMA Insertion Date/Time: 08/02/2012 8:55 AM Performed by: Norva Pavlov Pre-anesthesia Checklist: Patient identified, Emergency Drugs available, Suction available and Patient being monitored Patient Re-evaluated:Patient Re-evaluated prior to inductionOxygen Delivery Method: Circle System Utilized Preoxygenation: Pre-oxygenation with 100% oxygen Intubation Type: IV induction Ventilation: Mask ventilation without difficulty LMA: LMA inserted LMA Size: 4.0 Number of attempts: 1 Airway Equipment and Method: bite block Placement Confirmation: positive ETCO2 Tube secured with: Tape Dental Injury: Teeth and Oropharynx as per pre-operative assessment

## 2012-08-03 ENCOUNTER — Encounter (HOSPITAL_BASED_OUTPATIENT_CLINIC_OR_DEPARTMENT_OTHER): Payer: Self-pay | Admitting: Urology

## 2012-08-06 ENCOUNTER — Ambulatory Visit (INDEPENDENT_AMBULATORY_CARE_PROVIDER_SITE_OTHER): Payer: PRIVATE HEALTH INSURANCE | Admitting: Family Medicine

## 2012-08-06 VITALS — BP 102/73 | HR 108 | Temp 98.5°F | Resp 18 | Ht 66.0 in | Wt 178.0 lb

## 2012-08-06 DIAGNOSIS — R5381 Other malaise: Secondary | ICD-10-CM

## 2012-08-06 DIAGNOSIS — R002 Palpitations: Secondary | ICD-10-CM

## 2012-08-06 DIAGNOSIS — N3010 Interstitial cystitis (chronic) without hematuria: Secondary | ICD-10-CM

## 2012-08-06 DIAGNOSIS — R42 Dizziness and giddiness: Secondary | ICD-10-CM

## 2012-08-06 DIAGNOSIS — R5383 Other fatigue: Secondary | ICD-10-CM

## 2012-08-06 DIAGNOSIS — R197 Diarrhea, unspecified: Secondary | ICD-10-CM

## 2012-08-06 DIAGNOSIS — E860 Dehydration: Secondary | ICD-10-CM

## 2012-08-06 DIAGNOSIS — R112 Nausea with vomiting, unspecified: Secondary | ICD-10-CM

## 2012-08-06 LAB — POCT CBC
Granulocyte percent: 69.9 %G (ref 37–80)
Hemoglobin: 12 g/dL — AB (ref 12.2–16.2)
Lymph, poc: 1.8 (ref 0.6–3.4)
MCHC: 31.2 g/dL — AB (ref 31.8–35.4)
MPV: 8.8 fL (ref 0–99.8)
POC Granulocyte: 5.2 (ref 2–6.9)
POC MID %: 5.5 %M (ref 0–12)
RBC: 3.9 M/uL — AB (ref 4.04–5.48)

## 2012-08-06 LAB — POCT URINALYSIS DIPSTICK
Nitrite, UA: NEGATIVE
Protein, UA: NEGATIVE
Spec Grav, UA: 1.02
Urobilinogen, UA: 0.2
pH, UA: 6.5

## 2012-08-06 LAB — POCT UA - MICROSCOPIC ONLY
Casts, Ur, LPF, POC: NEGATIVE
Crystals, Ur, HPF, POC: NEGATIVE
Yeast, UA: NEGATIVE

## 2012-08-06 LAB — BASIC METABOLIC PANEL
Chloride: 106 mEq/L (ref 96–112)
Creat: 0.75 mg/dL (ref 0.50–1.10)
Sodium: 139 mEq/L (ref 135–145)

## 2012-08-06 LAB — GLUCOSE, POCT (MANUAL RESULT ENTRY): POC Glucose: 71 mg/dl (ref 70–99)

## 2012-08-06 MED ORDER — ONDANSETRON 4 MG PO TBDP: 4.0000 mg | ORAL_TABLET | Freq: Three times a day (TID) | ORAL | Status: DC | PRN

## 2012-08-06 NOTE — Progress Notes (Signed)
Subjective:    Patient ID: Abigail Higgins, female    DOB: 02-11-1981, 32 y.o.   MRN: [id number]  HPI Abigail Higgins is a 32 y.o. female S/p urethral dilation and cystoscopy 08/02/12. Operation: Urethral dilation, cystourethroscopy, hydrodistention the bladder (750 cc) instillation of Pyridium in the bladder by Dr. Patsi Sears. Taking tramadol and pyridium initally. Last dose of both last night.  No dysuria.no abd pain.   2 days ago - started with diarrhea, shaky, weak, disoriented - went home from work.  Continued flushed feeling, resolution of diarrhea same day, but nausea persisted, and feeling disoriented.  Vomited twice yesterday, just nausea today.  Able to eat sandwich last night. Feels weak, shaky.   No known sick contacts. 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 Sober from alcohol for 7 years. Sober from [employer] use 10 years ago.   Urinating normally. Last uop this am, "normal' amount yesterday.   Hx Bipolar d/o, no recent change in meds. Seroquel, , xanax,  and klonopin at night only - no recent increase in doses.  Followed by Dr. Nolen Mu. Less xanax recently as not feeling well.   Tx: none.   Has IUD - no regular menses.    Review of Systems  Constitutional: Positive for fatigue. Negative for fever and chills.  Cardiovascular: Positive for palpitations.  Gastrointestinal: Positive for nausea, vomiting and diarrhea. Negative for abdominal pain and abdominal distention.  Genitourinary: Negative for dysuria, urgency, frequency, difficulty urinating and pelvic pain.  Skin: Negative for rash.  Neurological: Positive for dizziness, weakness (generalized fatigue. ) and light-headedness (feels "disoriented"). Negative for seizures and syncope.       Objective:   Physical Exam  Vitals reviewed. Constitutional: She is oriented to person, place, and time. She appears well-developed and well-nourished. No distress.  HENT:  Head: Normocephalic and atraumatic.  Eyes: EOM  are normal. Pupils are equal, round, and reactive to light.  Cardiovascular: Normal pulses.  An irregular rhythm present.  No extrasystoles are present. Tachycardia present.  PMI is not displaced.  Exam reveals no friction rub.   No murmur heard. Pulmonary/Chest: Effort normal and breath sounds normal. She has no wheezes.  Abdominal: Soft. Bowel sounds are normal. There is no tenderness. There is no rebound and no guarding.  Neurological: She is alert and oriented to person, place, and time. She has normal strength. She is not disoriented (orineted to person, place, time. ). No sensory deficit. She displays a negative Romberg sign. Coordination normal.  No focal weakness.  Skin: Skin is warm and dry.  Psychiatric: Her affect is blunt. Her speech is not slurred. She is slowed.  Flat affect, but responsive to questions     IV placed for rehydration - see note.   EKG: sinus tachycardia, no acute findings otherwise.   Results for orders placed in visit on 08/06/12  POCT UA - MICROSCOPIC ONLY      Result Value Range   WBC, Ur, HPF, POC 4-6     RBC, urine, microscopic 1-3     Bacteria, U Microscopic 1+     Mucus, UA trace     Epithelial cells, urine per micros 2-4     Crystals, Ur, HPF, POC neg     Casts, Ur, LPF, POC neg     Yeast, UA neg    POCT URINALYSIS DIPSTICK      Result Value Range   Color, UA yellow     Clarity, UA clear     Glucose, UA neg  Bilirubin, UA neg     Ketones, UA neg     Spec Grav, UA 1.020     Blood, UA small     pH, UA 6.5     Protein, UA neg     Urobilinogen, UA 0.2     Nitrite, UA neg     Leukocytes, UA small (1+)    POCT CBC      Result Value Range   WBC 7.4  4.6 - 10.2 K/uL   Lymph, poc 1.8  0.6 - 3.4   POC LYMPH PERCENT 24.6  10 - 50 %L   MID (cbc) 0.4  0 - 0.9   POC MID % 5.5  0 - 12 %M   POC Granulocyte 5.2  2 - 6.9   Granulocyte percent 69.9  37 - 80 %G   RBC 3.90 (*) 4.04 - 5.48 M/uL   Hemoglobin 12.0 (*) 12.2 - 16.2 g/dL   HCT, POC  40.9  81.1 - 47.9 %   MCV 98.6 (*) 80 - 97 fL   MCH, POC 30.8  27 - 31.2 pg   MCHC 31.2 (*) 31.8 - 35.4 g/dL   RDW, POC 91.4     Platelet Count, POC 235  142 - 424 K/uL   MPV 8.8  0 - 99.8 fL  GLUCOSE, POCT (MANUAL RESULT ENTRY)      Result Value Range   POC Glucose 71  70 - 99 mg/dl   7829: feels a little better, "more rejuvenated" with IVF.  Results discussed above. orthostatic vitals reviewed, reassuring.     Assessment & Plan:  Abigail Higgins is a 32 y.o. female Dizziness -Other malaise and fatigue, dizziness, subjective disorientation, with diarrhea 2 days ago, vomiting once yesterday. Possible viral illness vs side effect of ultracet.  Reassuring exam, nonfocal neCBC, check Basic metabolic panel, TSH, Urine culture.  Improved with IVF, encouraged oral rehydration tonight - zofran if needed, and recheck in next 24-48 hours if not improving. ER p  Diarrhea - Plan: POCT CBC, Basic metabolic panel. Bland diet, sx care.   Palpitations - Plan: EKG 12-Lead reassuring.  tsh as above. ? Volume depletion.   Interstitial cystitis - Plan: Urine culture, but discussed with urologist, also recommended against antibiotics at this point. rtc precautions.   Patient Instructions  Drink plenty of fluids and rest tonight.  If needed for nausea, can take zofran up to every 8 hours. Return to the clinic or go to the nearest emergency room if any of your symptoms worsen or new symptoms occur. You should receive a call or letter about your lab results within the next week to 10 days, but recheck next 2-3 days of not continuing to improve.  Would also avoid any further doses of tramadol (Ultracet) as this may have contributed to fatigue and current symptoms.  No antibiotics at this point, but will check urine culture. Again, if any worsening - return to clinic.   Gastroenteritis:  Diarrhea Infections caused by germs (bacterial) or a virus commonly cause diarrhea. Your caregiver has determined that  with time, rest and fluids, the diarrhea should improve. In general, eat normally while drinking more water than usual. Although water may prevent dehydration, it does not contain salt and minerals (electrolytes). Broths, weak tea without caffeine and oral rehydration solutions (ORS) replace fluids and electrolytes. Small amounts of fluids should be taken frequently. Large amounts at one time may not be tolerated. Plain water may be harmful in infants and the  elderly. Oral rehydrating solutions (ORS) are available at pharmacies and grocery stores. ORS replace water and important electrolytes in proper proportions. Sports drinks are not as effective as ORS and may be harmful due to sugars worsening diarrhea.  ORS is especially recommended for use in children with diarrhea. As a general guideline for children, replace any new fluid losses from diarrhea and/or vomiting with ORS as follows:   If your child weighs 22 pounds or under (10 kg or less), give 60-120 mL ( -  cup or 2 - 4 ounces) of ORS for each episode of diarrheal stool or vomiting episode.   If your child weighs more than 22 pounds (more than 10 kgs), give 120-240 mL ( - 1 cup or 4 - 8 ounces) of ORS for each diarrheal stool or episode of vomiting.   While correcting for dehydration, children should eat normally. However, foods high in sugar should be avoided because this may worsen diarrhea. Large amounts of carbonated soft drinks, juice, gelatin desserts and other highly sugared drinks should be avoided.   After correction of dehydration, other liquids that are appealing to the child may be added. Children should drink small amounts of fluids frequently and fluids should be increased as tolerated. Children should drink enough fluids to keep urine clear or pale yellow.   Adults should eat normally while drinking more fluids than usual. Drink small amounts of fluids frequently and increase as tolerated. Drink enough fluids to keep urine clear  or pale yellow. Broths, weak decaffeinated tea, lemon lime soft drinks (allowed to go flat) and ORS replace fluids and electrolytes.   Avoid:   Carbonated drinks.   Juice.   Extremely hot or cold fluids.   Caffeine drinks.   Fatty, greasy foods.   Alcohol.   Tobacco.   Too much intake of anything at one time.   Gelatin desserts.   Probiotics are active cultures of beneficial bacteria. They may lessen the amount and number of diarrheal stools in adults. Probiotics can be found in yogurt with active cultures and in supplements.   Wash hands well to avoid spreading bacteria and virus.   Anti-diarrheal medications are not recommended for infants and children.   Only take over-the-counter or prescription medicines for pain, discomfort or fever as directed by your caregiver. Do not give aspirin to children because it may cause Reye's Syndrome.   For adults, ask your caregiver if you should continue all prescribed and over-the-counter medicines.   If your caregiver has given you a follow-up appointment, it is very important to keep that appointment. Not keeping the appointment could result in a chronic or permanent injury, and disability. If there is any problem keeping the appointment, you must call back to this facility for assistance.  SEEK IMMEDIATE MEDICAL CARE IF:   You or your child is unable to keep fluids down or other symptoms or problems become worse in spite of treatment.   Vomiting or diarrhea develops and becomes persistent.   There is vomiting of blood or bile (green material).   There is blood in the stool or the stools are black and tarry.   There is no urine output in 6-8 hours or there is only a small amount of very dark urine.   Abdominal pain develops, increases or localizes.   You have a fever.   Your baby is older than 3 months with a rectal temperature of 102 F (38.9 C) or higher.   Your baby is 59 months old  or younger with a rectal temperature of  100.4 F (38 C) or higher.   You or your child develops excessive weakness, dizziness, fainting or extreme thirst.   You or your child develops a rash, stiff neck, severe headache or become irritable or sleepy and difficult to awaken.  MAKE SURE YOU:   Understand these instructions.   Will watch your condition.   Will get help right away if you are not doing well or get worse.  Document Released: 02/21/2002 Document Revised: 02/20/2011 Document Reviewed: 01/08/2009 [facility] Patient Information 2012 [place], [place].  Nausea and Vomiting Nausea is a sick feeling that often comes before throwing up (vomiting). Vomiting is a reflex where stomach contents come out of your mouth. Vomiting can cause severe loss of body fluids (dehydration). Children and elderly adults can become dehydrated quickly, especially if they also have diarrhea. Nausea and vomiting are symptoms of a condition or disease. It is important to find the cause of your symptoms. CAUSES   Direct irritation of the stomach lining. This irritation can result from increased acid production (gastroesophageal reflux disease), infection, food poisoning, taking certain medicines (such as nonsteroidal anti-inflammatory drugs), alcohol use, or tobacco use.   Signals from the brain.These signals could be caused by a headache, heat exposure, an inner ear disturbance, increased pressure in the brain from injury, infection, a tumor, or a concussion, pain, emotional stimulus, or metabolic problems.   An obstruction in the gastrointestinal tract (bowel obstruction).   Illnesses such as diabetes, hepatitis, gallbladder problems, appendicitis, kidney problems, cancer, sepsis, atypical symptoms of a heart attack, or eating disorders.   Medical treatments such as chemotherapy and radiation.   Receiving medicine that makes you sleep (general anesthetic) during surgery.  DIAGNOSIS Your caregiver may ask for tests to be done if the problems do  not improve after a few days. Tests may also be done if symptoms are severe or if the reason for the nausea and vomiting is not clear. Tests may include:  Urine tests.   Blood tests.   Stool tests.   Cultures (to look for evidence of infection).   X-rays or other imaging studies.  Test results can help your caregiver make decisions about treatment or the need for additional tests. TREATMENT You need to stay well hydrated. Drink frequently but in small amounts.You may wish to drink water, sports drinks, clear broth, or eat frozen ice pops or gelatin dessert to help stay hydrated.When you eat, eating slowly may help prevent nausea.There are also some antinausea medicines that may help prevent nausea. HOME CARE INSTRUCTIONS   Take all medicine as directed by your caregiver.   If you do not have an appetite, do not force yourself to eat. However, you must continue to drink fluids.   If you have an appetite, eat a normal diet unless your caregiver tells you differently.   Eat a variety of complex carbohydrates (rice, wheat, potatoes, bread), lean meats, yogurt, fruits, and vegetables.   Avoid high-fat foods because they are more difficult to digest.   Drink enough water and fluids to keep your urine clear or pale yellow.   If you are dehydrated, ask your caregiver for specific rehydration instructions. Signs of dehydration may include:   Severe thirst.   Dry lips and mouth.   Dizziness.   Dark urine.   Decreasing urine frequency and amount.   Confusion.   Rapid breathing or pulse.  SEEK IMMEDIATE MEDICAL CARE IF:   You have blood or brown flecks (  like coffee grounds) in your vomit.   You have black or bloody stools.   You have a severe headache or stiff neck.   You are confused.   You have severe abdominal pain.   You have chest pain or trouble breathing.   You do not urinate at least once every 8 hours.   You develop cold or clammy skin.   You continue to  vomit for longer than 24 to 48 hours.   You have a fever.  MAKE SURE YOU:   Understand these instructions.   Will watch your condition.   Will get help right away if you are not doing well or get worse.  Document Released: 03/03/2005 Document Revised: 02/20/2011 Document Reviewed: 07/31/2010 [facility] Patient Information 2012 [place], [place].  Return to the clinic or go to the nearest emergency room if any of your symptoms worsen or new symptoms occur.

## 2012-08-06 NOTE — Patient Instructions (Addendum)
Drink plenty of fluids and rest tonight.  If needed for nausea, can take zofran up to every 8 hours. Return to the clinic or go to the nearest emergency room if any of your symptoms worsen or new symptoms occur. You should receive a call or letter about your lab results within the next week to 10 days, but recheck next 2-3 days of not continuing to improve.  Would also avoid any further doses of tramadol (Ultracet) as this may have contributed to fatigue and current symptoms.  No antibiotics at this point, but will check urine culture. Again, if any worsening - return to clinic.   Gastroenteritis:  Diarrhea Infections caused by germs (bacterial) or a virus commonly cause diarrhea. Your caregiver has determined that with time, rest and fluids, the diarrhea should improve. In general, eat normally while drinking more water than usual. Although water may prevent dehydration, it does not contain salt and minerals (electrolytes). Broths, weak tea without caffeine and oral rehydration solutions (ORS) replace fluids and electrolytes. Small amounts of fluids should be taken frequently. Large amounts at one time may not be tolerated. Plain water may be harmful in infants and the elderly. Oral rehydrating solutions (ORS) are available at pharmacies and grocery stores. ORS replace water and important electrolytes in proper proportions. Sports drinks are not as effective as ORS and may be harmful due to sugars worsening diarrhea.  ORS is especially recommended for use in children with diarrhea. As a general guideline for children, replace any new fluid losses from diarrhea and/or vomiting with ORS as follows:   If your child weighs 22 pounds or under (10 kg or less), give 60-120 mL ( -  cup or 2 - 4 ounces) of ORS for each episode of diarrheal stool or vomiting episode.   If your child weighs more than 22 pounds (more than 10 kgs), give 120-240 mL ( - 1 cup or 4 - 8 ounces) of ORS for each diarrheal stool or  episode of vomiting.   While correcting for dehydration, children should eat normally. However, foods high in sugar should be avoided because this may worsen diarrhea. Large amounts of carbonated soft drinks, juice, gelatin desserts and other highly sugared drinks should be avoided.   After correction of dehydration, other liquids that are appealing to the child may be added. Children should drink small amounts of fluids frequently and fluids should be increased as tolerated. Children should drink enough fluids to keep urine clear or pale yellow.   Adults should eat normally while drinking more fluids than usual. Drink small amounts of fluids frequently and increase as tolerated. Drink enough fluids to keep urine clear or pale yellow. Broths, weak decaffeinated tea, lemon lime soft drinks (allowed to go flat) and ORS replace fluids and electrolytes.   Avoid:   Carbonated drinks.   Juice.   Extremely hot or cold fluids.   Caffeine drinks.   Fatty, greasy foods.   Alcohol.   Tobacco.   Too much intake of anything at one time.   Gelatin desserts.   Probiotics are active cultures of beneficial bacteria. They may lessen the amount and number of diarrheal stools in adults. Probiotics can be found in yogurt with active cultures and in supplements.   Wash hands well to avoid spreading bacteria and virus.   Anti-diarrheal medications are not recommended for infants and children.   Only take over-the-counter or prescription medicines for pain, discomfort or fever as directed by your caregiver. Do not give aspirin  to children because it may cause Reye's Syndrome.   For adults, ask your caregiver if you should continue all prescribed and over-the-counter medicines.   If your caregiver has given you a follow-up appointment, it is very important to keep that appointment. Not keeping the appointment could result in a chronic or permanent injury, and disability. If there is any problem keeping  the appointment, you must call back to this facility for assistance.  SEEK IMMEDIATE MEDICAL CARE IF:   You or your child is unable to keep fluids down or other symptoms or problems become worse in spite of treatment.   Vomiting or diarrhea develops and becomes persistent.   There is vomiting of blood or bile (green material).   There is blood in the stool or the stools are black and tarry.   There is no urine output in 6-8 hours or there is only a small amount of very dark urine.   Abdominal pain develops, increases or localizes.   You have a fever.   Your baby is older than 3 months with a rectal temperature of 102 F (38.9 C) or higher.   Your baby is 12 months old or younger with a rectal temperature of 100.4 F (38 C) or higher.   You or your child develops excessive weakness, dizziness, fainting or extreme thirst.   You or your child develops a rash, stiff neck, severe headache or become irritable or sleepy and difficult to awaken.  MAKE SURE YOU:   Understand these instructions.   Will watch your condition.   Will get help right away if you are not doing well or get worse.  Document Released: 02/21/2002 Document Revised: 02/20/2011 Document Reviewed: 01/08/2009 [facility] Patient Information 2012 [place], [place].  Nausea and Vomiting Nausea is a sick feeling that often comes before throwing up (vomiting). Vomiting is a reflex where stomach contents come out of your mouth. Vomiting can cause severe loss of body fluids (dehydration). Children and elderly adults can become dehydrated quickly, especially if they also have diarrhea. Nausea and vomiting are symptoms of a condition or disease. It is important to find the cause of your symptoms. CAUSES   Direct irritation of the stomach lining. This irritation can result from increased acid production (gastroesophageal reflux disease), infection, food poisoning, taking certain medicines (such as nonsteroidal anti-inflammatory  drugs), alcohol use, or tobacco use.   Signals from the brain.These signals could be caused by a headache, heat exposure, an inner ear disturbance, increased pressure in the brain from injury, infection, a tumor, or a concussion, pain, emotional stimulus, or metabolic problems.   An obstruction in the gastrointestinal tract (bowel obstruction).   Illnesses such as diabetes, hepatitis, gallbladder problems, appendicitis, kidney problems, cancer, sepsis, atypical symptoms of a heart attack, or eating disorders.   Medical treatments such as chemotherapy and radiation.   Receiving medicine that makes you sleep (general anesthetic) during surgery.  DIAGNOSIS Your caregiver may ask for tests to be done if the problems do not improve after a few days. Tests may also be done if symptoms are severe or if the reason for the nausea and vomiting is not clear. Tests may include:  Urine tests.   Blood tests.   Stool tests.   Cultures (to look for evidence of infection).   X-rays or other imaging studies.  Test results can help your caregiver make decisions about treatment or the need for additional tests. TREATMENT You need to stay well hydrated. Drink frequently but in small amounts.You  may wish to drink water, sports drinks, clear broth, or eat frozen ice pops or gelatin dessert to help stay hydrated.When you eat, eating slowly may help prevent nausea.There are also some antinausea medicines that may help prevent nausea. HOME CARE INSTRUCTIONS   Take all medicine as directed by your caregiver.   If you do not have an appetite, do not force yourself to eat. However, you must continue to drink fluids.   If you have an appetite, eat a normal diet unless your caregiver tells you differently.   Eat a variety of complex carbohydrates (rice, wheat, potatoes, bread), lean meats, yogurt, fruits, and vegetables.   Avoid high-fat foods because they are more difficult to digest.   Drink enough  water and fluids to keep your urine clear or pale yellow.   If you are dehydrated, ask your caregiver for specific rehydration instructions. Signs of dehydration may include:   Severe thirst.   Dry lips and mouth.   Dizziness.   Dark urine.   Decreasing urine frequency and amount.   Confusion.   Rapid breathing or pulse.  SEEK IMMEDIATE MEDICAL CARE IF:   You have blood or brown flecks (like coffee grounds) in your vomit.   You have black or bloody stools.   You have a severe headache or stiff neck.   You are confused.   You have severe abdominal pain.   You have chest pain or trouble breathing.   You do not urinate at least once every 8 hours.   You develop cold or clammy skin.   You continue to vomit for longer than 24 to 48 hours.   You have a fever.  MAKE SURE YOU:   Understand these instructions.   Will watch your condition.   Will get help right away if you are not doing well or get worse.  Document Released: 03/03/2005 Document Revised: 02/20/2011 Document Reviewed: 07/31/2010 [facility] Patient Information 2012 [place], [place].  Return to the clinic or go to the nearest emergency room if any of your symptoms worsen or new symptoms occur.

## 2012-08-08 LAB — URINE CULTURE: Colony Count: 9000

## 2012-10-20 ENCOUNTER — Telehealth: Payer: Self-pay | Admitting: *Deleted

## 2012-10-20 NOTE — Telephone Encounter (Signed)
Pt called c/o some bloody watery discharge after sex last week and some light spotting. Pt said this would be the first time this has happened. She agreed to watch for now and call back if this continue. Pt had no other complains.

## 2012-11-09 ENCOUNTER — Other Ambulatory Visit: Payer: Self-pay | Admitting: *Deleted

## 2012-11-09 DIAGNOSIS — E7800 Pure hypercholesterolemia, unspecified: Secondary | ICD-10-CM

## 2012-12-13 ENCOUNTER — Encounter: Payer: Self-pay | Admitting: Gynecology

## 2012-12-13 ENCOUNTER — Ambulatory Visit (INDEPENDENT_AMBULATORY_CARE_PROVIDER_SITE_OTHER): Payer: PRIVATE HEALTH INSURANCE | Admitting: Gynecology

## 2012-12-13 VITALS — BP 128/86

## 2012-12-13 DIAGNOSIS — R102 Pelvic and perineal pain: Secondary | ICD-10-CM

## 2012-12-13 DIAGNOSIS — N949 Unspecified condition associated with female genital organs and menstrual cycle: Secondary | ICD-10-CM

## 2012-12-13 DIAGNOSIS — T8332XA Displacement of intrauterine contraceptive device, initial encounter: Secondary | ICD-10-CM

## 2012-12-13 LAB — URINALYSIS W MICROSCOPIC + REFLEX CULTURE
Hgb urine dipstick: NEGATIVE
Ketones, ur: NEGATIVE mg/dL
Nitrite: NEGATIVE
Specific Gravity, Urine: >1.03 — ABNORMAL HIGH (ref 1.005–1.030)
Urobilinogen, UA: 0.2 mg/dL (ref 0.0–1.0)

## 2012-12-13 LAB — WET PREP FOR TRICH, YEAST, CLUE: Trich, Wet Prep: NONE SEEN

## 2012-12-13 NOTE — Progress Notes (Signed)
Patient presented to the office today concerned that recently having intercourse she used a foreign object vaginally and had been complaining of some low suprapubic discomfort slight discharge. No fever, chills or any dysuria. Patient had a Mirena IUD placed early this year.  Exam: Abdomen: Slightly tender suprapubic Back: No CVA tenderness Pelvic: Bartholin urethra Skene was within normal limits Vagina: No lesions or discharge Cervix: Friable cervix IUD string not seen Colposcope was brought into view and with the use of endocervical speculum the IUD string was seen high in the endocervical canal. Bimanual exam: Uterus anteverted slightly tender suprapubically adnexa no gross masses or tenderness noted  GC and Chlamydia culture obtained results pending at time of this dictation  Wet prep:negative  Urinalysis:negative  Assessment/plan: Recent unusual sexual activity with use of foreign object vaginally cause significant amount of discomfort. There was no evidence of any vaginal tear or bleeding. IUD string barely seen in the endocervical canal. Patient will return to the office for followup ultrasound for better assessment right adnexa and uterus and position of the IUD.

## 2012-12-14 ENCOUNTER — Other Ambulatory Visit: Payer: PRIVATE HEALTH INSURANCE

## 2012-12-14 ENCOUNTER — Ambulatory Visit: Payer: PRIVATE HEALTH INSURANCE | Admitting: Gynecology

## 2012-12-14 LAB — GC/CHLAMYDIA PROBE AMP: GC Probe RNA: NEGATIVE

## 2012-12-15 ENCOUNTER — Ambulatory Visit (INDEPENDENT_AMBULATORY_CARE_PROVIDER_SITE_OTHER): Payer: PRIVATE HEALTH INSURANCE

## 2012-12-15 ENCOUNTER — Ambulatory Visit (INDEPENDENT_AMBULATORY_CARE_PROVIDER_SITE_OTHER): Payer: PRIVATE HEALTH INSURANCE | Admitting: Gynecology

## 2012-12-15 DIAGNOSIS — R102 Pelvic and perineal pain: Secondary | ICD-10-CM

## 2012-12-15 DIAGNOSIS — N8300 Follicular cyst of ovary, unspecified side: Secondary | ICD-10-CM

## 2012-12-15 DIAGNOSIS — N838 Other noninflammatory disorders of ovary, fallopian tube and broad ligament: Secondary | ICD-10-CM

## 2012-12-15 DIAGNOSIS — N949 Unspecified condition associated with female genital organs and menstrual cycle: Secondary | ICD-10-CM

## 2012-12-15 DIAGNOSIS — N83201 Unspecified ovarian cyst, right side: Secondary | ICD-10-CM

## 2012-12-15 DIAGNOSIS — N831 Corpus luteum cyst of ovary, unspecified side: Secondary | ICD-10-CM

## 2012-12-15 DIAGNOSIS — N3010 Interstitial cystitis (chronic) without hematuria: Secondary | ICD-10-CM | POA: Insufficient documentation

## 2012-12-15 DIAGNOSIS — T8332XA Displacement of intrauterine contraceptive device, initial encounter: Secondary | ICD-10-CM

## 2012-12-15 DIAGNOSIS — N83209 Unspecified ovarian cyst, unspecified side: Secondary | ICD-10-CM

## 2012-12-15 DIAGNOSIS — E282 Polycystic ovarian syndrome: Secondary | ICD-10-CM

## 2012-12-15 NOTE — Progress Notes (Signed)
Patient presented to the office today for ultrasound followup. Patient was seen in the office yesterday concerned that she had recently having intercourse she used a foreign object vaginally and had been complaining of some low suprapubic discomfort slight discharge. No fever, chills or any dysuria. Patient had a Mirena IUD placed early this year. She had a negative wet prep and negative GC and chlamydia culture and negative urinalysis.  Ultrasound today: Uterus measures 7.6 x 4.4 x 3.5 cm with endometrial stripe of 2.3 cm. Patient with IUD found in the uterus in the proper position uterus slightly deviated to the left. It appears the patient had a collapsed right ovarian cyst with internal low level echoes measuring 26 x 9 x 24 mm and avascular. The patient was noted to have 30+ follicles on each ovary consistent with polycystic ovaries.  Patient is currently being followed by Dr. Patsi Sears for interstitial cystitis. Her discomfort may be attributed to this. I've asked her to return back to the office in 3 months for a followup ultrasound on this right ovarian cyst. Patient was offered nonnarcotic medication for pain relief and she states that her discomfort is not that bad the she will followup with the urologist.

## 2012-12-15 NOTE — Patient Instructions (Addendum)
Interstitial Cystitis Interstitial cystitis (IC) is a condition that results in discomfort or pain in the bladder and the surrounding pelvic region. The symptoms can be different from case to case and even in the same individual. People may experience:  Mild discomfort.  Pressure.  Tenderness.  Intense pain in the bladder and pelvic area. CAUSES  Because IC varies so much in symptoms and severity, people studying this disease believe it is not one but several diseases. Some caregivers use the term painful bladder syndrome (PBS) to describe cases with painful urinary symptoms. This may not meet the strictest definition of IC. The term IC / PBS includes all cases of urinary pain that cannot be connected to other causes, such as infection or urinary stones.  SYMPTOMS  Symptoms may include:  An urgent need to urinate.  A frequent need to urinate.  A combination of these symptoms. Pain may change in intensity as the bladder fills with urine or as it empties. Women's symptoms often get worse during menstruation. They may sometimes experience pain with vaginal intercourse. Some of the symptoms of IC / PBS seem like those of bacterial infection. Tests do not show infection. IC / PBS is far more common in women than in men.  DIAGNOSIS  The diagnosis of IC / PBS is based on:  Presence of pain related to the bladder, usually along with problems of frequency and urgency.  Not finding other diseases that could cause the symptoms.  Diagnostic tests that help rule out other diseases include:  Urinalysis.  Urine culture.  Cystoscopy.  Biopsy of the bladder wall.  Distension of the bladder under anesthesia.  Urine cytology.  Laboratory examination of prostate secretions. A biopsy is a tissue sample that can be looked at under a microscope. Samples of the bladder and urethra may be removed during a cystoscopy. A biopsy helps rule out bladder cancer. TREATMENT  Scientists have not yet found  a cure for IC / PBS. Patients with IC / PBS do not get better with antibiotic therapy. Caregivers cannot predict who will respond best to which treatment. Symptoms may disappear without explanation. Disappearing symptoms may coincide with an event such as a change in diet or treatment. Even when symptoms disappear, they may return after days, weeks, months, or years.  Because the causes of IC / PBS are unknown, current treatments are aimed at relieving symptoms. Many people are helped by one or a combination of the treatments. As researchers learn more about IC / PBS, the list of potential treatments will change. Patients should discuss their options with a caregiver. SURGERY  Surgery should be considered only if all available treatments have failed and the pain is disabling. Many approaches and techniques are used. Each approach has its own advantages and complications. Advantages and complications should be discussed with a urologist. Your caregiver may recommend consulting another urologist for a second opinion. Most caregivers are reluctant to operate because the outcome is unpredictable. Some people still have symptoms after surgery.  People considering surgery should discuss the potential risks and benefits, side effects, and long- and short-term complications with their family, as well as with people who have already had the procedure. Surgery requires anesthesia, hospitalization, and in some cases weeks or months of recovery. As the complexity of the procedure increases, so do the chances for complications and for failure. HOME CARE INSTRUCTIONS   All drugs, even those sold over the counter, have side effects. Patients should always consult a caregiver before using any   drug for an extended amount of time. Only take over-the-counter or prescription medicines for pain, discomfort, or fever as directed by your caregiver.  Many patients feel that smoking makes their symptoms worse. How the by-products  of tobacco that are excreted in the urine affect IC / PBS is unknown. Smoking is the major known cause of bladder cancer. One of the best things smokers can do for their bladder and their overall health is to quit.  Many patients feel that gentle stretching exercises help relieve IC / PBS symptoms.  Methods vary, but basically patients decide to empty their bladder at designated times and use relaxation techniques and distractions to keep to the schedule. Gradually, patients try to lengthen the time between scheduled voids. A diary in which to record voiding times is usually helpful in keeping track of progress. MAKE SURE YOU:   Understand these instructions.  Will watch your condition.  Will get help right away if you are not doing well or get worse. Document Released: 11/02/2003 Document Revised: 05/26/2011 Document Reviewed: 01/17/2008 [facility] Patient Information 2014 [place], [place]. Ovarian Cyst The ovaries are small organs that are on each side of the uterus. The ovaries are the organs that produce the female hormones, estrogen and progesterone. An ovarian cyst is a sac filled with fluid that can vary in its size. It is normal for a small cyst to form in women who are in the childbearing age and who have menstrual periods. This type of cyst is called a follicle cyst that becomes an ovulation cyst (corpus luteum cyst) after it produces the women's egg. It later goes away on its own if the woman does not become pregnant. There are other kinds of ovarian cysts that may cause problems and may need to be treated. The most serious problem is a cyst with cancer. It should be noted that menopausal women who have an ovarian cyst are at a higher risk of it being a cancer cyst. They should be evaluated very quickly, thoroughly and followed closely. This is especially true in menopausal women because of the high rate of ovarian cancer in women in menopause. CAUSES AND TYPES OF OVARIAN CYSTS:  FUNCTIONAL  CYST: The follicle/corpus luteum cyst is a functional cyst that occurs every month during ovulation with the menstrual cycle. They go away with the next menstrual cycle if the woman does not get pregnant. Usually, there are no symptoms with a functional cyst.  ENDOMETRIOMA CYST: This cyst develops from the lining of the uterus tissue. This cyst gets in or on the ovary. It grows every month from the bleeding during the menstrual period. It is also called a "chocolate cyst" because it becomes filled with blood that turns brown. This cyst can cause pain in the lower abdomen during intercourse and with your menstrual period.  CYSTADENOMA CYST: This cyst develops from the cells on the outside of the ovary. They usually are not cancerous. They can get very big and cause lower abdomen pain and pain with intercourse. This type of cyst can twist on itself, cut off its blood supply and cause severe pain. It also can easily rupture and cause a lot of pain.  DERMOID CYST: This type of cyst is sometimes found in both ovaries. They are found to have different kinds of body tissue in the cyst. The tissue includes skin, teeth, hair, and/or cartilage. They usually do not have symptoms unless they get very big. Dermoid cysts are rarely cancerous.  POLYCYSTIC OVARY: This is a  rare condition with hormone problems that produces many small cysts on both ovaries. The cysts are follicle-like cysts that never produce an egg and become a corpus luteum. It can cause an increase in body weight, infertility, acne, increase in body and facial hair and lack of menstrual periods or rare menstrual periods. Many women with this problem develop type 2 diabetes. The exact cause of this problem is unknown. A polycystic ovary is rarely cancerous.  THECA LUTEIN CYST: Occurs when too much hormone (human chorionic gonadotropin) is produced and over-stimulates the ovaries to produce an egg. They are frequently seen when doctors stimulate the  ovaries for invitro-fertilization (test tube babies).  LUTEOMA CYST: This cyst is seen during pregnancy. Rarely it can cause an obstruction to the birth canal during labor and delivery. They usually go away after delivery. SYMPTOMS   Pelvic pain or pressure.  Pain during sexual intercourse.  Increasing girth (swelling) of the abdomen.  Abnormal menstrual periods.  Increasing pain with menstrual periods.  You stop having menstrual periods and you are not pregnant. DIAGNOSIS  The diagnosis can be made during:  Routine or annual pelvic examination (common).  Ultrasound.  X-ray of the pelvis.  CT Scan.  MRI.  Blood tests. TREATMENT   Treatment may only be to follow the cyst monthly for 2 to 3 months with your caregiver. Many go away on their own, especially functional cysts.  May be aspirated (drained) with a long needle with ultrasound, or by laparoscopy (inserting a tube into the pelvis through a small incision).  The whole cyst can be removed by laparoscopy.  Sometimes the cyst may need to be removed through an incision in the lower abdomen.  Hormone treatment is sometimes used to help dissolve certain cysts.  Birth control pills are sometimes used to help dissolve certain cysts. HOME CARE INSTRUCTIONS  Follow your caregiver's advice regarding:  Medicine.  Follow up visits to evaluate and treat the cyst.  You may need to come back or make an appointment with another caregiver, to find the exact cause of your cyst, if your caregiver is not a gynecologist.  Get your yearly and recommended pelvic examinations and Pap tests.  Let your caregiver know if you have had an ovarian cyst in the past. SEEK MEDICAL CARE IF:   Your periods are late, irregular, they stop, or are painful.  Your stomach (abdomen) or pelvic pain does not go away.  Your stomach becomes larger or swollen.  You have pressure on your bladder or trouble emptying your bladder completely.  You  have painful sexual intercourse.  You have feelings of fullness, pressure, or discomfort in your stomach.  You lose weight for no apparent reason.  You feel generally ill.  You become constipated.  You lose your appetite.  You develop acne.  You have an increase in body and facial hair.  You are gaining weight, without changing your exercise and eating habits.  You think you are pregnant. SEEK IMMEDIATE MEDICAL CARE IF:   You have increasing abdominal pain.  You feel sick to your stomach (nausea) and/or vomit.  You develop a fever that comes on suddenly.  You develop abdominal pain during a bowel movement.  Your menstrual periods become heavier than usual. Document Released: 03/03/2005 Document Revised: 05/26/2011 Document Reviewed: 01/04/2009 [facility] Patient Information 2014 [place], [place].

## 2013-01-31 ENCOUNTER — Telehealth: Payer: Self-pay | Admitting: Gynecology

## 2013-01-31 NOTE — Telephone Encounter (Signed)
Pt is looking for a pcp . Pt's med list includes xanax and ClonazePAM (KLONOPIN PO), lisdexamfetamine (VYVANSE) 50 MG capsule I explained to pt about your med policy. And she states she will not get these meds from you. pls advise if you will still accept

## 2013-02-01 NOTE — Telephone Encounter (Signed)
Her medical needs may be better served by an internal medicine physician. Please provide her with the [facility] number and they can provide her a list of physicians. Thanks.

## 2013-02-04 NOTE — Telephone Encounter (Signed)
lmom for pt to call back

## 2013-02-04 NOTE — Telephone Encounter (Signed)
sch w/ dr Artist Pais

## 2013-02-04 NOTE — Telephone Encounter (Signed)
Instructed to call back after 3pm

## 2013-02-24 ENCOUNTER — Ambulatory Visit: Payer: PRIVATE HEALTH INSURANCE | Admitting: Family Medicine

## 2013-03-09 ENCOUNTER — Ambulatory Visit: Payer: PRIVATE HEALTH INSURANCE | Admitting: Internal Medicine

## 2013-03-14 ENCOUNTER — Encounter: Payer: Self-pay | Admitting: Internal Medicine

## 2013-03-14 ENCOUNTER — Ambulatory Visit (INDEPENDENT_AMBULATORY_CARE_PROVIDER_SITE_OTHER): Payer: PRIVATE HEALTH INSURANCE | Admitting: Internal Medicine

## 2013-03-14 VITALS — BP 124/64 | HR 102 | Temp 98.3°F | Ht 65.5 in | Wt 188.0 lb

## 2013-03-14 DIAGNOSIS — F3289 Other specified depressive episodes: Secondary | ICD-10-CM

## 2013-03-14 DIAGNOSIS — R635 Abnormal weight gain: Secondary | ICD-10-CM

## 2013-03-14 DIAGNOSIS — R5383 Other fatigue: Secondary | ICD-10-CM

## 2013-03-14 DIAGNOSIS — F319 Bipolar disorder, unspecified: Secondary | ICD-10-CM

## 2013-03-14 DIAGNOSIS — R21 Rash and other nonspecific skin eruption: Secondary | ICD-10-CM

## 2013-03-14 DIAGNOSIS — F32A Depression, unspecified: Secondary | ICD-10-CM

## 2013-03-14 DIAGNOSIS — K5900 Constipation, unspecified: Secondary | ICD-10-CM

## 2013-03-14 DIAGNOSIS — R5381 Other malaise: Secondary | ICD-10-CM | POA: Insufficient documentation

## 2013-03-14 DIAGNOSIS — F329 Major depressive disorder, single episode, unspecified: Secondary | ICD-10-CM

## 2013-03-14 MED ORDER — LUBIPROSTONE 8 MCG PO CAPS: 8.0000 ug | ORAL_CAPSULE | Freq: Two times a day (BID) | ORAL | Status: DC

## 2013-03-14 NOTE — Assessment & Plan Note (Signed)
32 year old with unexplained weight gain. I doubt she has underactive thyroid. However check TSH, CBCD, and LFTs. Her psychiatrist has requested we also check B12/ Folate Levels and vitamin D.  Add ANA.

## 2013-03-14 NOTE — Assessment & Plan Note (Signed)
32 year old white female with unexplained fatigue and rash. I doubt her rash secondary to lupus but check ANA.  Obtain CBC with differential, thyroid studies and LFTs. Patient reports recent normal HIV antibody testing.

## 2013-03-14 NOTE — Progress Notes (Signed)
Pre visit review using our clinic review tool, if applicable. No additional management support is needed unless otherwise documented below in the visit note. 

## 2013-03-14 NOTE — Patient Instructions (Signed)
Try taking miralax and metamucile over the once daily

## 2013-03-14 NOTE — Assessment & Plan Note (Addendum)
Patient has unexplained rash of her lower extremities. Patient seen by dermatologist-Dr. Sharyn Lull. Obtain copy of pathology report. Check sedimentation rate, complement levels, and ANA. Also screened for hepatitis C considering numerous tattoos.  Also consider Wilson's disease.  Obtain serum copper and ceruloplasmin.  If workup unrevealing, consider discontinuing Lamictal and using another mood stabilizer such as lithium.

## 2013-03-14 NOTE — Progress Notes (Signed)
Subjective:    Patient ID: Abigail Higgins, female    DOB: [birth year], 32 y.o.   MRN: [id number]  HPI  32 year old white female with history of bipolar disorder, history of alcohol and substance abuse, and history of urethral polyps and bladder ulcers to establish. Patient reports being followed by psychologists-Margaret Zachery Dauer for many years.  Dr. Emerson Monte is her psychiatrist who manages her psychiatric medications. We reviewed in detail her current medication regimen. She has been on Lamictal for several years to help stabilize her mood. Patient also on Abilify and Seroquel. She reports gaining approximately 40 pounds since starting her medication.  She has been to a local dermatologist-Dr. Haverstock for unexplained rash of her lower extremities. She had small area biopsied below her left patella. She was prescribed a topical steroids but reports her skin lesions have not resolved.  She is also concerned that she is diabetic. She complains of constant fatigue. She also reports extreme thirst in the middle of the night but denies any polyuria. She has chronic constipation. She denies any history of fever or diarrhea.  Review of Systems  Constitutional: Negative for activity change, appetite change. Weight gain.  Memory issues.   Eyes: Negative for visual disturbance.  Respiratory: Negative for cough, chest tightness and shortness of breath.   Cardiovascular: Negative for chest pain.  Genitourinary: Negative for difficulty urinating.  Neurological: Negative for headaches.  Gastrointestinal: Negative for abdominal pain, heartburn melena or hematochezia, chronic constipation Psych: Negative for depression or anxiety Endo:  No polyuria or polydypsia, no cold intolerance or hair loss        Past Medical History  Diagnosis Date  . Depression   . CIN I (cervical intraepithelial neoplasia I)   . Anxiety   . Bipolar 1 disorder   . Urethral disorder URETHRAL SPRAYING  . Mild  acid reflux WATCHES DIET  . ADD (attention deficit disorder)     History   Social History  . Marital Status: Single    Spouse Name: N/A    Number of Children: N/A  . Years of Education: N/A   Occupational History  . Not on file.   Social History Main Topics  . Smoking status: Current Every Day Smoker -- 0.50 packs/day for 7 years    Types: Cigarettes  . Smokeless tobacco: Never Used  . Alcohol Use: No  . Drug Use: No  . Sexual Activity: Not on file   Other Topics Concern  . Not on file   Social History Narrative  . No narrative on file    Past Surgical History  Procedure Laterality Date  . Rhinoplasty  AGE 32  . Colposcopy    . Intrauterine device insertion  03-08-2012    MIRENA  . Tonsillectomy  AGE 71  . Cystoscopy with urethral dilatation N/A 08/02/2012    Procedure: CYSTOSCOPY WITH URETHRAL DILATATION WITH INSTILLATION OF PYRIDIUM AND PELVIC EXAM;  Surgeon: Kathi Ludwig, MD;  Location: [facility];  Service: Urology;  Laterality: N/A;    Family History  Problem Relation Age of Onset  . Adopted: Yes    No Known Allergies  Current Outpatient Prescriptions on File Prior to Visit  Medication Sig Dispense Refill  . ALPRAZolam (XANAX) 0.25 MG tablet Take 0.25 mg by mouth 4 (four) times daily as needed.       . ARIPiprazole (ABILIFY) 10 MG tablet Take 10 mg by mouth daily.      . ClonazePAM (KLONOPIN PO) Take 10 mg  by mouth at bedtime.       Marland Kitchen lisdexamfetamine (VYVANSE) 50 MG capsule Take 50 mg by mouth every morning.      Marland Kitchen QUEtiapine Fumarate (SEROQUEL PO) Take 50 mg by mouth daily.        No current facility-administered medications on file prior to visit.    BP 124/64  Pulse 102  Temp(Src) 98.3 F (36.8 C) (Oral)  Ht 5' 5.5" (1.664 m)  Wt 188 lb (85.276 kg)  BMI 30.80 kg/m2    Objective:   Physical Exam  Constitutional: She is oriented to person, place, and time. She appears well-developed and well-nourished. No distress.    HENT:  Head: Normocephalic and atraumatic.  Right Ear: External ear normal.  Left Ear: External ear normal.  Mouth/Throat: Oropharynx is clear and moist.  Eyes: Conjunctivae and EOM are normal. Pupils are equal, round, and reactive to light.  No defects in peripheral vision  Neck: Neck supple.  No carotid bruit  Cardiovascular: Normal rate, regular rhythm and normal heart sounds.   No murmur heard. Pulmonary/Chest: Effort normal and breath sounds normal. She has no wheezes.  Abdominal: Soft. Bowel sounds are normal. She exhibits no distension and no mass. There is no tenderness.  Abdominal obesity  Musculoskeletal: She exhibits no edema and no tenderness.  Lymphadenopathy:    She has no cervical adenopathy.  Neurological: She is alert and oriented to person, place, and time. No cranial nerve deficit.  Skin: Skin is warm and dry.  Patient has slightly erythematous patch over medial aspect of both thighs. Patient has purpuric faint circular lesions left foot and ankle. Similar faint lesions on right anterior tibia.    Multiple large tattoos covering entire back  Psychiatric: She has a normal mood and affect. Her behavior is normal.          Assessment & Plan:

## 2013-03-18 ENCOUNTER — Telehealth: Payer: Self-pay | Admitting: *Deleted

## 2013-03-28 ENCOUNTER — Other Ambulatory Visit (INDEPENDENT_AMBULATORY_CARE_PROVIDER_SITE_OTHER): Payer: PRIVATE HEALTH INSURANCE

## 2013-03-28 DIAGNOSIS — R5381 Other malaise: Secondary | ICD-10-CM

## 2013-03-28 DIAGNOSIS — F329 Major depressive disorder, single episode, unspecified: Secondary | ICD-10-CM

## 2013-03-28 DIAGNOSIS — R635 Abnormal weight gain: Secondary | ICD-10-CM

## 2013-03-28 DIAGNOSIS — R21 Rash and other nonspecific skin eruption: Secondary | ICD-10-CM

## 2013-03-28 DIAGNOSIS — R5383 Other fatigue: Secondary | ICD-10-CM

## 2013-03-28 DIAGNOSIS — F32A Depression, unspecified: Secondary | ICD-10-CM

## 2013-03-28 DIAGNOSIS — F3289 Other specified depressive episodes: Secondary | ICD-10-CM

## 2013-03-28 DIAGNOSIS — F319 Bipolar disorder, unspecified: Secondary | ICD-10-CM

## 2013-03-28 LAB — URINALYSIS, ROUTINE W REFLEX MICROSCOPIC
BILIRUBIN URINE: NEGATIVE
HGB URINE DIPSTICK: NEGATIVE
Ketones, ur: NEGATIVE
Leukocytes, UA: NEGATIVE
NITRITE: NEGATIVE
PH: 7 (ref 5.0–8.0)
Specific Gravity, Urine: 1.02 (ref 1.000–1.030)
Total Protein, Urine: NEGATIVE
Urine Glucose: NEGATIVE
Urobilinogen, UA: 0.2 (ref 0.0–1.0)

## 2013-03-28 LAB — BASIC METABOLIC PANEL
BUN: 16 mg/dL (ref 6–23)
CHLORIDE: 104 meq/L (ref 96–112)
CO2: 27 mEq/L (ref 19–32)
Calcium: 9.3 mg/dL (ref 8.4–10.5)
Creatinine, Ser: 0.8 mg/dL (ref 0.4–1.2)
GFR: 87.12 mL/min (ref >60.00)
Glucose, Bld: 85 mg/dL (ref 70–99)
POTASSIUM: 3.8 meq/L (ref 3.5–5.1)
SODIUM: 139 meq/L (ref 135–145)

## 2013-03-28 LAB — CBC WITH DIFFERENTIAL/PLATELET
BASOS PCT: 0.6 % (ref 0.0–3.0)
Basophils Absolute: 0 10*3/uL (ref 0.0–0.1)
EOS PCT: 3.7 % (ref 0.0–5.0)
Eosinophils Absolute: 0.2 10*3/uL (ref 0.0–0.7)
HEMATOCRIT: 42.8 % (ref 36.0–46.0)
HEMOGLOBIN: 14.5 g/dL (ref 12.0–15.0)
LYMPHS ABS: 2.1 10*3/uL (ref 0.7–4.0)
Lymphocytes Relative: 30.8 % (ref 12.0–46.0)
MCHC: 33.9 g/dL (ref 30.0–36.0)
MCV: 93.1 fl (ref 78.0–100.0)
MONO ABS: 0.3 10*3/uL (ref 0.1–1.0)
MONOS PCT: 4.9 % (ref 3.0–12.0)
NEUTROS ABS: 4.1 10*3/uL (ref 1.4–7.7)
Neutrophils Relative %: 60 % (ref 43.0–77.0)
Platelets: 316 10*3/uL (ref 150.0–400.0)
RBC: 4.59 Mil/uL (ref 3.87–5.11)
RDW: 13.7 % (ref 11.5–14.6)
WBC: 6.8 10*3/uL (ref 4.5–10.5)

## 2013-03-28 LAB — LDL CHOLESTEROL, DIRECT: LDL DIRECT: 145.6 mg/dL

## 2013-03-28 LAB — LIPID PANEL
CHOLESTEROL: 234 mg/dL — AB (ref 0–200)
HDL: 70.6 mg/dL (ref >39.00)
Total CHOL/HDL Ratio: 3
Triglycerides: 90 mg/dL (ref 0.0–149.0)
VLDL: 18 mg/dL (ref 0.0–40.0)

## 2013-03-28 LAB — HEPATIC FUNCTION PANEL
ALBUMIN: 4.4 g/dL (ref 3.5–5.2)
ALT: 33 U/L (ref 0–35)
AST: 30 U/L (ref 0–37)
Alkaline Phosphatase: 87 U/L (ref 39–117)
Bilirubin, Direct: 0.1 mg/dL (ref 0.0–0.3)
Total Bilirubin: 0.9 mg/dL (ref 0.3–1.2)
Total Protein: 7.9 g/dL (ref 6.0–8.3)

## 2013-03-28 LAB — TSH: TSH: 1.33 u[IU]/mL (ref 0.35–5.50)

## 2013-03-28 LAB — FOLATE: Folate: 19.9 ng/mL (ref >5.9)

## 2013-03-28 LAB — T4, FREE: Free T4: 0.66 ng/dL (ref 0.60–1.60)

## 2013-03-28 LAB — VITAMIN B12: VITAMIN B 12: 972 pg/mL — AB (ref 211–911)

## 2013-03-28 LAB — HEMOGLOBIN A1C: Hgb A1c MFr Bld: 5.6 % (ref 4.6–6.5)

## 2013-03-29 LAB — HEPATITIS B SURFACE ANTIBODY,QUALITATIVE: HEP B S AB: POSITIVE — AB

## 2013-03-29 LAB — ANA: Anti Nuclear Antibody(ANA): NEGATIVE

## 2013-03-29 LAB — HEPATITIS B SURFACE ANTIGEN: Hepatitis B Surface Ag: NEGATIVE

## 2013-03-29 LAB — VITAMIN D 25 HYDROXY (VIT D DEFICIENCY, FRACTURES): Vit D, 25-Hydroxy: 12 ng/mL — ABNORMAL LOW (ref 30–89)

## 2013-03-30 LAB — CERULOPLASMIN: CERULOPLASMIN: 31 mg/dL (ref 20–60)

## 2013-03-30 LAB — C4 COMPLEMENT: C4 Complement: 21 mg/dL (ref 10–40)

## 2013-03-30 LAB — C3 COMPLEMENT: C3 COMPLEMENT: 131 mg/dL (ref 90–180)

## 2013-03-30 LAB — COPPER, SERUM: Copper: 121 ug/dL (ref 70–175)

## 2013-03-30 LAB — COMPLEMENT, TOTAL

## 2013-04-11 ENCOUNTER — Ambulatory Visit (INDEPENDENT_AMBULATORY_CARE_PROVIDER_SITE_OTHER): Payer: PRIVATE HEALTH INSURANCE | Admitting: Internal Medicine

## 2013-04-11 ENCOUNTER — Encounter: Payer: Self-pay | Admitting: Internal Medicine

## 2013-04-11 VITALS — BP 118/70 | HR 88 | Temp 98.0°F | Ht 65.5 in | Wt 192.0 lb

## 2013-04-11 DIAGNOSIS — R5383 Other fatigue: Secondary | ICD-10-CM

## 2013-04-11 DIAGNOSIS — R21 Rash and other nonspecific skin eruption: Secondary | ICD-10-CM

## 2013-04-11 DIAGNOSIS — R5381 Other malaise: Secondary | ICD-10-CM

## 2013-04-11 DIAGNOSIS — R635 Abnormal weight gain: Secondary | ICD-10-CM

## 2013-04-11 NOTE — Assessment & Plan Note (Signed)
Patient's blood sugar is normal as well as her A1c. As long as she remains on atypical antipsychotics, I suggest monitoring for type 2 diabetes every 6-12 months.

## 2013-04-11 NOTE — Progress Notes (Signed)
Pre visit review using our clinic review tool, if applicable. No additional management support is needed unless otherwise documented below in the visit note. 

## 2013-04-11 NOTE — Assessment & Plan Note (Signed)
Nonspecific.  Screening for autoimmune disease normal.

## 2013-04-11 NOTE — Progress Notes (Signed)
Subjective:    Patient ID: Abigail Higgins, female    DOB: 08-22-1980, 33 y.o.   MRN: [id number]  HPI  33 year old white female with history of bipolar disorder, history of alcohol/substance abuse, and unexplained rash for followup. Patient reports no significant change in her lower remedy rash. She was prescribed a steroid cream which seems to temporarily help alleviate her rash but her symptoms return with discontinuation of topical steroid. Dermatology pathology report sent for scanning.  Considering multitude of patient's complaints - fatigue, unusual rash, and bipolar disorder patient was screened for possible Wilson's disease, vasculitis and autoimmune disease. Her serum ceruloplasmin was normal. Her complement levels were unremarkable. Her ANA screen was normal. HIV screening normal. Unfortunately hepatitis C antibody testing was not completed.  Patient has followup appointment with her psychiatrist to discuss whether any of her psychiatric medications can be a trigger for her rash.  Review of Systems Other labs reviewed including slightly elevated cholesterol levels.  A1c normal.  No significant weight change    Past Medical History  Diagnosis Date  . Depression   . CIN I (cervical intraepithelial neoplasia I)   . Anxiety   . Bipolar 1 disorder   . Urethral disorder URETHRAL SPRAYING  . Mild acid reflux WATCHES DIET  . ADD (attention deficit disorder)     History   Social History  . Marital Status: Single    Spouse Name: N/A    Number of Children: N/A  . Years of Education: N/A   Occupational History  . Not on file.   Social History Main Topics  . Smoking status: Current Every Day Smoker -- 0.50 packs/day for 7 years    Types: Cigarettes  . Smokeless tobacco: Never Used  . Alcohol Use: No  . Drug Use: No  . Sexual Activity: Not on file   Other Topics Concern  . 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 Rhinoplasty  AGE 58  . Colposcopy    . Intrauterine device insertion  03-08-2012    MIRENA  . Tonsillectomy  AGE 40  . Cystoscopy with urethral dilatation N/A 08/02/2012    Procedure: CYSTOSCOPY WITH URETHRAL DILATATION WITH INSTILLATION OF PYRIDIUM AND PELVIC EXAM;  Surgeon: Kathi LudwigSigmund I Tannenbaum, MD;  [facility] [place];  Service: Urology;  Laterality: N/A;    Family History  Problem Relation Age of Onset  . Adopted: Yes    No Known Allergies  Current Outpatient Prescriptions on File Prior to Visit  Medication Sig Dispense Refill  . ALPRAZolam (XANAX) 0.25 MG tablet Take 0.25 mg by mouth 4 (four) times daily as needed.       . ARIPiprazole (ABILIFY) 10 MG tablet Take 10 mg by mouth daily.      . clobetasol cream (TEMOVATE) 0.05 % Apply 1 application topically 2 (two) times daily as needed.      . ClonazePAM (KLONOPIN PO) Take 10 mg by mouth at bedtime.       . lamoTRIgine (LAMICTAL) 200 MG tablet Take 200 mg by mouth daily.      Marland Kitchen. lisdexamfetamine (VYVANSE) 50 MG capsule Take 50 mg by mouth every morning.      . loratadine (CLARITIN) 10 MG tablet Take 10 mg by mouth  daily.      . QUEtiapine Fumarate (SEROQUEL PO) Take 50 mg by mouth daily.       . Rutin 50 MG TABS Take 1 tablet by mouth at bedtime.       No current facility-administered medications on file prior to visit.    BP 118/70  Pulse 88  Temp(Src) 98 F (36.7 C) (Oral)  Ht 5' 5.5" (1.664 m)  Wt 192 lb (87.091 kg)  BMI 31.45 kg/m2    Objective:   Physical Exam  Constitutional: She is oriented to person, place, and time. She appears well-developed and well-nourished. No distress.  HENT:  Head: Normocephalic and atraumatic.  Right Ear: External ear normal.  Left Ear: External ear normal.  Eyes: Pupils are equal, round, and reactive to light.  No scleral icterus.  Iris appears normal  Neck: Neck supple.  Cardiovascular: Normal rate, regular rhythm and normal heart sounds.   No murmur heard. Pulmonary/Chest: Effort normal and breath sounds normal. She has no wheezes.  Musculoskeletal: She exhibits no edema.  Lymphadenopathy:    She has no cervical adenopathy.  Neurological: She is alert and oriented to person, place, and time.  Skin:  Lower extremity rash is unchanged.          Assessment & Plan:

## 2013-04-11 NOTE — Assessment & Plan Note (Signed)
Patient with persistent unexplained rash. Screening for Wilson's disease, vasculitis or autoimmune disease was unremarkable. Patient to discuss whether any of her psychiatric medications may be triggering her rash with her psychiatrist. If persistent skin changes, we discussed referral to dermatology department at tertiary center [facility]

## 2013-04-11 NOTE — Patient Instructions (Signed)
Your work up for autoimmune disease, Wilson's Disease and vasculitis was negative. Please follow up with psychiatrist re: possible medication change If your rash does not improve, we can consider [facility]

## 2013-04-12 LAB — HEPATITIS C ANTIBODY: HCV Ab: NEGATIVE

## 2013-04-15 ENCOUNTER — Other Ambulatory Visit: Payer: Self-pay | Admitting: Internal Medicine

## 2013-04-15 MED ORDER — VITAMIN D (ERGOCALCIFEROL) 1.25 MG (50000 UNIT) PO CAPS: 50000.0000 [IU] | ORAL_CAPSULE | ORAL | Status: DC

## 2013-04-20 ENCOUNTER — Telehealth: Payer: Self-pay | Admitting: Internal Medicine

## 2013-04-20 NOTE — Telephone Encounter (Signed)
Relevant patient education assigned to patient using Emmi. ° °

## 2013-11-09 ENCOUNTER — Ambulatory Visit (INDEPENDENT_AMBULATORY_CARE_PROVIDER_SITE_OTHER): Payer: PRIVATE HEALTH INSURANCE | Admitting: Family Medicine

## 2013-11-09 VITALS — BP 116/70 | HR 110 | Temp 98.4°F | Resp 18 | Ht 66.0 in | Wt 192.0 lb

## 2013-11-09 DIAGNOSIS — J069 Acute upper respiratory infection, unspecified: Secondary | ICD-10-CM

## 2013-11-09 DIAGNOSIS — K148 Other diseases of tongue: Secondary | ICD-10-CM

## 2013-11-09 LAB — POCT SKIN KOH: SKIN KOH, POC: NEGATIVE

## 2013-11-09 NOTE — Patient Instructions (Addendum)
Saline nasal spray atleast 4 times per day, over the counter mucinex or mucinex DM for cough, drink plenty of fluids. Up to 3 days of Afrin nasal spray if needed for congestion/runny nose, but this medicine (as like other decongestants) can cause your heart rate to increase.  Return to the clinic or go to the nearest emergency room if any of your symptoms worsen or new symptoms occur.  Follow up with your primary provider regarding the discoloration on your tongue. The tests was negative for thrush today. If painful or more sensitive - return here or primary provider sooner.   Upper Respiratory Infection, Adult An upper respiratory infection (URI) is also sometimes known as the common cold. The upper respiratory tract includes the nose, sinuses, throat, trachea, and bronchi. Bronchi are the airways leading to the lungs. Most people improve within 1 week, but symptoms can last up to 2 weeks. A residual cough may last even longer.  CAUSES Many different viruses can infect the tissues lining the upper respiratory tract. The tissues become irritated and inflamed and often become very moist. Mucus production is also common. A cold is contagious. You can easily spread the virus to others by oral contact. This includes kissing, sharing a glass, coughing, or sneezing. Touching your mouth or nose and then touching a surface, which is then touched by another person, can also spread the virus. SYMPTOMS  Symptoms typically develop 1 to 3 days after you come in contact with a cold virus. Symptoms vary from person to person. They may include:  Runny nose.  Sneezing.  Nasal congestion.  Sinus irritation.  Sore throat.  Loss of voice (laryngitis).  Cough.  Fatigue.  Muscle aches.  Loss of appetite.  Headache.  Low-grade fever. DIAGNOSIS  You might diagnose your own cold based on familiar symptoms, since most people get a cold 2 to 3 times a year. Your caregiver can confirm this based on your exam.  Most importantly, your caregiver can check that your symptoms are not due to another disease such as strep throat, sinusitis, pneumonia, asthma, or epiglottitis. Blood tests, throat tests, and X-rays are not necessary to diagnose a common cold, but they may sometimes be helpful in excluding other more serious diseases. Your caregiver will decide if any further tests are required. RISKS AND COMPLICATIONS  You may be at risk for a more severe case of the common cold if you smoke cigarettes, have chronic heart disease (such as heart failure) or lung disease (such as asthma), or if you have a weakened immune system. The very young and very old are also at risk for more serious infections. Bacterial sinusitis, middle ear infections, and bacterial pneumonia can complicate the common cold. The common cold can worsen asthma and chronic obstructive pulmonary disease (COPD). Sometimes, these complications can require emergency medical care and may be life-threatening. PREVENTION  The best way to protect against getting a cold is to practice good hygiene. Avoid oral or hand contact with people with cold symptoms. Wash your hands often if contact occurs. There is no clear evidence that vitamin C, vitamin E, echinacea, or exercise reduces the chance of developing a cold. However, it is always recommended to get plenty of rest and practice good nutrition. TREATMENT  Treatment is directed at relieving symptoms. There is no cure. Antibiotics are not effective, because the infection is caused by a virus, not by bacteria. Treatment may include:  Increased fluid intake. Sports drinks offer valuable electrolytes, sugars, and fluids.  Breathing  heated mist or steam (vaporizer or shower).  Eating chicken soup or other clear broths, and maintaining good nutrition.  Getting plenty of rest.  Using gargles or lozenges for comfort.  Controlling fevers with ibuprofen or acetaminophen as directed by your  caregiver.  Increasing usage of your inhaler if you have asthma. Zinc gel and zinc lozenges, taken in the first 24 hours of the common cold, can shorten the duration and lessen the severity of symptoms. Pain medicines may help with fever, muscle aches, and throat pain. A variety of non-prescription medicines are available to treat congestion and runny nose. Your caregiver can make recommendations and may suggest nasal or lung inhalers for other symptoms.  HOME CARE INSTRUCTIONS   Only take over-the-counter or prescription medicines for pain, discomfort, or fever as directed by your caregiver.  Use a warm mist humidifier or inhale steam from a shower to increase air moisture. This may keep secretions moist and make it easier to breathe.  Drink enough water and fluids to keep your urine clear or pale yellow.  Rest as needed.  Return to work when your temperature has returned to normal or as your caregiver advises. You may need to stay home longer to avoid infecting others. You can also use a face mask and careful hand washing to prevent spread of the virus. SEEK MEDICAL CARE IF:   After the first few days, you feel you are getting worse rather than better.  You need your caregiver's advice about medicines to control symptoms.  You develop chills, worsening shortness of breath, or brown or red sputum. These may be signs of pneumonia.  You develop yellow or brown nasal discharge or pain in the face, especially when you bend forward. These may be signs of sinusitis.  You develop a fever, swollen neck glands, pain with swallowing, or white areas in the back of your throat. These may be signs of strep throat. SEEK IMMEDIATE MEDICAL CARE IF:   You have a fever.  You develop severe or persistent headache, ear pain, sinus pain, or chest pain.  You develop wheezing, a prolonged cough, cough up blood, or have a change in your usual mucus (if you have chronic lung disease).  You develop sore  muscles or a stiff neck. Document Released: 08/27/2000 Document Revised: 05/26/2011 Document Reviewed: 06/08/2013 [facility] Patient Information 2015 [place], [place]. This information is not intended to replace advice given to you by your health care provider. Make sure you discuss any questions you have with your health care provider.

## 2013-11-09 NOTE — Progress Notes (Addendum)
Subjective:  This chart was scribed for Abigail Staggers, MD by Abigail Higgins, ED Scribe. This patient was seen in room 13 and the patient's care was started at 5:12 PM.  Patient ID: Abigail Higgins, female    DOB: 05-27-1980, 33 y.o.   MRN: [id number]  Cough Associated symptoms include chills, a fever, headaches and rhinorrhea.  Headache  Associated symptoms include coughing, a fever, rhinorrhea and weakness.  Fever  Associated symptoms include congestion, coughing and headaches.   Chief Complaint  Patient presents with  . Nasal Congestion    since monday   . Cough  . Headache  . Fever    yesterday   . Chills  . Generalized Body Aches   HPI Comments: Abigail Higgins is a 33 y.o. female who presents to the [facility] complaining of cold symptoms. Pt states symptoms started with a cough 2 days ago that persisted to nasal congestion, decreased appetite and HA 1 day later. Pt states she felt feverish 1 day ago while working but could have been due to exerting herself. She reports weakness fatigue and cough are her worst symptoms at this time. Pt has taken alka seltzer cold plus and generic night time tablets. Pt was seen at the dentist 1 day ago and was told she had a white film over her tongue. Pt reports h/o thrush. Pt reports finishing a dose of abx 1 day ago for acne.  She denies sick contacts.    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 Pt is requesting a doctors not for work for rest. Pt is getting married in 3 days.  PCP- Dr. Artist Pais  Patient Active Problem List   Diagnosis Date Noted  . Rash 03/14/2013  . Other malaise and fatigue 03/14/2013  . IC (interstitial cystitis) 12/15/2012  . Adopted 05/18/2012  . Weight gain 05/18/2012  . Depression   . CIN I (cervical intraepithelial neoplasia I)    Past Medical History  Diagnosis Date  . Depression   . CIN I (cervical intraepithelial neoplasia I)   . Anxiety   . Bipolar 1 disorder   . Urethral  disorder URETHRAL SPRAYING  . Mild acid reflux WATCHES DIET  . ADD (attention deficit disorder)    Past Surgical History  Procedure Laterality Date  . Rhinoplasty  AGE 60  . Colposcopy    . Intrauterine device insertion  03-08-2012    MIRENA  . Tonsillectomy  AGE 42  . Cystoscopy with urethral dilatation N/A 08/02/2012    Procedure: CYSTOSCOPY WITH URETHRAL DILATATION WITH INSTILLATION OF PYRIDIUM AND PELVIC EXAM;  Surgeon: Kathi Ludwig, MD;  Location: [facility];  Service: Urology;  Laterality: N/A;   No Known Allergies Prior to Admission medications   Medication Sig Start Date End Date Taking? Authorizing Provider  ALPRAZolam (XANAX) 0.25 MG tablet Take 0.25 mg by mouth 4 (four) times daily as needed.    Yes Historical Provider, MD  ARIPiprazole (ABILIFY) 10 MG tablet Take 10 mg by mouth daily.   Yes Historical Provider, MD  ClonazePAM (KLONOPIN PO) Take 10 mg by mouth at bedtime.    Yes Historical Provider, MD  lisdexamfetamine (VYVANSE) 50 MG capsule Take 50 mg by mouth every morning.   Yes Historical Provider, MD  QUEtiapine Fumarate (SEROQUEL PO) Take 50 mg by mouth daily.    Yes Historical Provider, MD  clobetasol cream (TEMOVATE) 0.05 % Apply 1 application topically 2 (two) times daily as needed.  Historical Provider, MD  lamoTRIgine (LAMICTAL) 200 MG tablet Take 200 mg by mouth daily.    Historical Provider, MD  loratadine (CLARITIN) 10 MG tablet Take 10 mg by mouth daily.    Historical Provider, MD  Rutin 50 MG TABS Take 1 tablet by mouth at bedtime.    Historical Provider, MD  Vitamin D, Ergocalciferol, (DRISDOL) 50000 UNITS CAPS capsule Take 1 capsule (50,000 Units total) by mouth every 7 (seven) days. 04/15/13   Abigail Sherran Needs, DO   History   Social History  . Marital Status: Single    Spouse Name: N/A    Number of Children: N/A  . Years of Education: N/A   Occupational History  . Not on file.   Social History Main Topics  . Smoking status:  Current Every Day Smoker -- 0.50 packs/day for 7 years    Types: Cigarettes  . Smokeless tobacco: Never Used  . Alcohol Use: No  . Drug Use: No  . Sexual Activity: Not on file   Other Topics Concern  . 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  HENT: Positive for congestion and rhinorrhea.   Respiratory: Positive for cough.   Neurological: Positive for weakness and headaches.   Objective:   Physical Exam  Nursing note and vitals reviewed. Constitutional: She is oriented to person, place, and time. She appears well-developed and well-nourished. No distress.  HENT:  Head: Normocephalic and atraumatic.  Right Ear: Hearing, tympanic membrane, external ear and ear canal normal.  Left Ear: Hearing, tympanic membrane, external ear and ear canal normal.  Nose: Rhinorrhea present. Right sinus exhibits no maxillary sinus tenderness and no frontal sinus tenderness. Left sinus exhibits no maxillary sinus tenderness and no frontal sinus tenderness.  Mouth/Throat: Oropharynx is clear and moist. No oropharyngeal exudate or posterior oropharyngeal erythema.  Slight white appearance to dorsum of tongue. there is a white coating on tongue that was scrapped off. No white patches on the buccal mucosa. Minimal clear rhinorrhea.   Eyes: Conjunctivae and EOM are normal. Pupils are equal, round, and reactive to light.  Neck: Neck supple.  Cardiovascular: Normal rate, regular rhythm, normal heart sounds and intact distal pulses.   No murmur heard. Pulmonary/Chest: Effort normal and breath sounds normal. No respiratory distress. She has no wheezes. She has no rhonchi.  Musculoskeletal: Normal range of motion.  Neurological: She is alert and oriented to  person, place, and time.  Skin: Skin is warm and dry. No rash noted.  Psychiatric: She has a normal mood and affect. Her behavior is normal.   Filed Vitals:   11/09/13 1551  BP: 116/70  Pulse: 110  Temp: 98.4 F (36.9 C)  TempSrc: Oral  Resp: 18  Height:  (1.676 m)  Weight: 192 lb (87.091 kg)  SpO2: 98%   Results for orders placed in visit on 11/09/13  POCT SKIN KOH      Result Value Ref Range   Skin KOH, POC Negative        Assessment & Plan:   Abigail Higgins is a 33 y.o. female Discoloration of tongue - Plan: POCT Skin KOH  KOH negative. Asymptomatic. ddx included thrush with recent abx's.  If this becomes symptomatic, consider  trial of Mycelex troches.  O/w follow up with PCP if persists to look into other causes such as leukoplakia. rtc precautions.   Acute upper respiratory infections of unspecified site  - sx care as noted on patient instructions, rtc precautions.   -OOW note was provided for sx care next 2 days if needed, but discussed she can rtw if feeling better tomorrow or Friday.    No orders of the defined types were placed in this encounter.   Patient Instructions  Saline nasal spray atleast 4 times per day, over the counter mucinex or mucinex DM for cough, drink plenty of fluids. Up to 3 days of Afrin nasal spray if needed for congestion/runny nose, but this medicine (as like other decongestants) can cause your heart rate to increase.  Return to the clinic or go to the nearest emergency room if any of your symptoms worsen or new symptoms occur.  Follow up with your primary provider regarding the discoloration on your tongue. The tests was negative for thrush today. If painful or more sensitive - return here or primary provider sooner.   Upper Respiratory Infection, Adult An upper respiratory infection (URI) is also sometimes known as the common cold. The upper respiratory tract includes the nose, sinuses, throat, trachea, and bronchi. Bronchi are the  airways leading to the lungs. Most people improve within 1 week, but symptoms can last up to 2 weeks. A residual cough may last even longer.  CAUSES Many different viruses can infect the tissues lining the upper respiratory tract. The tissues become irritated and inflamed and often become very moist. Mucus production is also common. A cold is contagious. You can easily spread the virus to others by oral contact. This includes kissing, sharing a glass, coughing, or sneezing. Touching your mouth or nose and then touching a surface, which is then touched by another person, can also spread the virus. SYMPTOMS  Symptoms typically develop 1 to 3 days after you come in contact with a cold virus. Symptoms vary from person to person. They may include:  Runny nose.  Sneezing.  Nasal congestion.  Sinus irritation.  Sore throat.  Loss of voice (laryngitis).  Cough.  Fatigue.  Muscle aches.  Loss of appetite.  Headache.  Low-grade fever. DIAGNOSIS  You might diagnose your own cold based on familiar symptoms, since most people get a cold 2 to 3 times a year. Your caregiver can confirm this based on your exam. Most importantly, your caregiver can check that your symptoms are not due to another disease such as strep throat, sinusitis, pneumonia, asthma, or epiglottitis. Blood tests, throat tests, and X-rays are not necessary to diagnose a common cold, but they may sometimes be helpful in excluding other more serious diseases. Your caregiver will decide if any further tests are required. RISKS AND COMPLICATIONS  You may be at risk for a more severe case of the common cold if you smoke cigarettes, have chronic heart disease (such as heart failure) or lung disease (such as asthma), or if you have a weakened immune system. The very young and very old are also at risk for more serious infections. Bacterial sinusitis, middle ear infections, and bacterial pneumonia can complicate the common cold. The common  cold can worsen asthma and chronic obstructive pulmonary disease (COPD). Sometimes, these complications can require emergency medical care and may be life-threatening. PREVENTION  The best way to protect against getting a cold is to practice good hygiene. Avoid oral or hand contact with people with  cold symptoms. Wash your hands often if contact occurs. There is no clear evidence that vitamin C, vitamin E, echinacea, or exercise reduces the chance of developing a cold. However, it is always recommended to get plenty of rest and practice good nutrition. TREATMENT  Treatment is directed at relieving symptoms. There is no cure. Antibiotics are not effective, because the infection is caused by a virus, not by bacteria. Treatment may include:  Increased fluid intake. Sports drinks offer valuable electrolytes, sugars, and fluids.  Breathing heated mist or steam (vaporizer or shower).  Eating chicken soup or other clear broths, and maintaining good nutrition.  Getting plenty of rest.  Using gargles or lozenges for comfort.  Controlling fevers with ibuprofen or acetaminophen as directed by your caregiver.  Increasing usage of your inhaler if you have asthma. Zinc gel and zinc lozenges, taken in the first 24 hours of the common cold, can shorten the duration and lessen the severity of symptoms. Pain medicines may help with fever, muscle aches, and throat pain. A variety of non-prescription medicines are available to treat congestion and runny nose. Your caregiver can make recommendations and may suggest nasal or lung inhalers for other symptoms.  HOME CARE INSTRUCTIONS   Only take over-the-counter or prescription medicines for pain, discomfort, or fever as directed by your caregiver.  Use a warm mist humidifier or inhale steam from a shower to increase air moisture. This may keep secretions moist and make it easier to breathe.  Drink enough water and fluids to keep your urine clear or pale  yellow.  Rest as needed.  Return to work when your temperature has returned to normal or as your caregiver advises. You may need to stay home longer to avoid infecting others. You can also use a face mask and careful hand washing to prevent spread of the virus. SEEK MEDICAL CARE IF:   After the first few days, you feel you are getting worse rather than better.  You need your caregiver's advice about medicines to control symptoms.  You develop chills, worsening shortness of breath, or brown or red sputum. These may be signs of pneumonia.  You develop yellow or brown nasal discharge or pain in the face, especially when you bend forward. These may be signs of sinusitis.  You develop a fever, swollen neck glands, pain with swallowing, or white areas in the back of your throat. These may be signs of strep throat. SEEK IMMEDIATE MEDICAL CARE IF:   You have a fever.  You develop severe or persistent headache, ear pain, sinus pain, or chest pain.  You develop wheezing, a prolonged cough, cough up blood, or have a change in your usual mucus (if you have chronic lung disease).  You develop sore muscles or a stiff neck. Document Released: 08/27/2000 Document Revised: 05/26/2011 Document Reviewed: 06/08/2013 [facility] Patient Information 2015 [place], [place]. This information is not intended to replace advice given to you by your health care provider. Make sure you discuss any questions you have with your health care provider.

## 2013-12-15 ENCOUNTER — Encounter: Payer: Self-pay | Admitting: Gynecology

## 2013-12-15 ENCOUNTER — Ambulatory Visit (INDEPENDENT_AMBULATORY_CARE_PROVIDER_SITE_OTHER): Payer: PRIVATE HEALTH INSURANCE | Admitting: Gynecology

## 2013-12-15 DIAGNOSIS — N9089 Other specified noninflammatory disorders of vulva and perineum: Secondary | ICD-10-CM

## 2013-12-15 DIAGNOSIS — Z23 Encounter for immunization: Secondary | ICD-10-CM

## 2013-12-15 MED ORDER — DOXYCYCLINE HYCLATE 100 MG PO CAPS: ORAL_CAPSULE | ORAL | Status: DC

## 2013-12-15 MED ORDER — FLUCONAZOLE 150 MG PO TABS: 150.0000 mg | ORAL_TABLET | Freq: Once | ORAL | Status: DC

## 2013-12-15 NOTE — Progress Notes (Signed)
   Patient is a 33 year old who presented to the office stating that for the past 2 weeks she had noted a nodular area on her external genitalia. She got married on August 29 of this year. She is sexually active. Patient has a Mirena IUD for contraception. Her partner has had a vasectomy and she uses a condom because of irritation from semen. Patient denies any vaginal discharge. Patient requesting flu vaccine.  Exam: Physical Exam  Genitourinary:      On examination it appears that this superior portion of the right labia majora could be an underlying small follicular cyst versus abscess. Patient will be placed on Vibramycin 100 mg 1 by mouth twice a day for 2 weeks and ask her to do daily sitz baths and apply Neosporin topically 2 times a day. She will return back in 2 weeks if still present we'll proceed then with I&D alert she becomes symptomatic worse. Patient received the flu vaccine today.

## 2013-12-15 NOTE — Patient Instructions (Signed)

## 2013-12-29 ENCOUNTER — Encounter: Payer: Self-pay | Admitting: Gynecology

## 2013-12-29 ENCOUNTER — Ambulatory Visit (INDEPENDENT_AMBULATORY_CARE_PROVIDER_SITE_OTHER): Payer: PRIVATE HEALTH INSURANCE | Admitting: Gynecology

## 2013-12-29 VITALS — BP 124/80 | Ht 66.0 in | Wt 192.0 lb

## 2013-12-29 DIAGNOSIS — N9089 Other specified noninflammatory disorders of vulva and perineum: Secondary | ICD-10-CM

## 2013-12-29 NOTE — Progress Notes (Signed)
   Patient presented to the office for her two-week followup visit after she was diagnosed with a right labia majora follicular cyst versus abscess. She had been started on Vibramycin 100 mg one by mouth twice a day for 2 weeks. She was also to do daily sitz baths and apply Neosporin topically twice a day. She is doing well she is asymptomatic does not feel the nodularity that she did in the past.  Exam: Bartholin urethra Skene was within normal limits external genitalia no discoloration no skin lesions no palpable masses. Area of concern no longer present on the right labia majora upper portion.  Assessment/plan: Right superior labia majora sebaceous cysts resolve with above treatment regimen. Patient to return back in March of next year for her annual exam or when necessary.

## 2014-01-02 ENCOUNTER — Encounter: Payer: PRIVATE HEALTH INSURANCE | Admitting: Gynecology

## 2014-01-30 ENCOUNTER — Encounter: Payer: PRIVATE HEALTH INSURANCE | Admitting: Gynecology

## 2014-02-08 ENCOUNTER — Telehealth: Payer: Self-pay | Admitting: *Deleted

## 2014-02-08 NOTE — Telephone Encounter (Signed)
Pt called c/o UTI pain with urination and blood in urine requesting Rx sent to pharmacy. I advised pt OV best for infection, work patient in schedule?  Please advise

## 2014-02-08 NOTE — Telephone Encounter (Signed)
Yes office visit.

## 2014-02-08 NOTE — Telephone Encounter (Signed)
Font desk informed will contact pt to schedule.

## 2014-02-09 ENCOUNTER — Ambulatory Visit (INDEPENDENT_AMBULATORY_CARE_PROVIDER_SITE_OTHER): Payer: PRIVATE HEALTH INSURANCE | Admitting: Family Medicine

## 2014-02-09 VITALS — BP 128/70 | HR 120 | Temp 98.5°F | Resp 18 | Ht 65.0 in | Wt 195.0 lb

## 2014-02-09 DIAGNOSIS — N3001 Acute cystitis with hematuria: Secondary | ICD-10-CM

## 2014-02-09 DIAGNOSIS — R81 Glycosuria: Secondary | ICD-10-CM

## 2014-02-09 DIAGNOSIS — R350 Frequency of micturition: Secondary | ICD-10-CM

## 2014-02-09 LAB — POCT URINALYSIS DIPSTICK
GLUCOSE UA: 100
Ketones, UA: 40
NITRITE UA: POSITIVE
Protein, UA: >=300
Spec Grav, UA: 1.015
Urobilinogen, UA: >=8
pH, UA: 8.5

## 2014-02-09 LAB — POCT UA - MICROSCOPIC ONLY
CRYSTALS, UR, HPF, POC: NEGATIVE
Casts, Ur, LPF, POC: NEGATIVE
Mucus, UA: NEGATIVE
YEAST UA: NEGATIVE

## 2014-02-09 LAB — GLUCOSE, POCT (MANUAL RESULT ENTRY): POC Glucose: 84 mg/dl (ref 70–99)

## 2014-02-09 MED ORDER — CEPHALEXIN 500 MG PO CAPS: 500.0000 mg | ORAL_CAPSULE | Freq: Two times a day (BID) | ORAL | Status: DC

## 2014-02-09 NOTE — Progress Notes (Signed)
[facility] [address], [place] [id number] [phone]  Date:  02/09/2014   Name:  Abigail Gallantileen E Nazar   DOB:  09-19-1980   MRN:  [id number]  PCP:  Thomos Lemonsobert Yoo, DO    Chief Complaint: No chief complaint on file.   History of Present Illness:  Abigail Higgins is a 33 y.o. very pleasant female patient who presents with the following:  She has noted UTI sx for a couple of days.   She notes dyrusia, frequency, and blood in her urine.   She has not noted any fever, back pain or abdominal pain. She has noted hematuria with her previous UTI as well.  She had a cold for the past few weeks.   She did take some azo last night but it did not help.   She has mirena IUD and does not get menses.   NKDA No nausea vomiting, CP or SOB.  She notes that she just took her vyvanse and that is likely why her HR is up a little bit  Patient Active Problem List   Diagnosis Date Noted  . Rash 03/14/2013  . Other malaise and fatigue 03/14/2013  . IC (interstitial cystitis) 12/15/2012  . Adopted 05/18/2012  . Weight gain 05/18/2012  . Depression   . CIN I (cervical intraepithelial neoplasia I)     Past Medical History  Diagnosis Date  . Depression   . CIN I (cervical intraepithelial neoplasia I)   . Anxiety   . Bipolar 1 disorder   . Urethral disorder URETHRAL SPRAYING  . Mild acid reflux WATCHES DIET  . ADD (attention deficit disorder)     Past Surgical History  Procedure Laterality Date  . Rhinoplasty  AGE 36  . Colposcopy    . Intrauterine device insertion  03-08-2012    MIRENA  . Tonsillectomy  AGE 62  . Cystoscopy with urethral dilatation N/A 08/02/2012    Procedure: CYSTOSCOPY WITH URETHRAL DILATATION WITH INSTILLATION OF PYRIDIUM AND PELVIC EXAM;  Surgeon: Kathi LudwigSigmund I Tannenbaum, MD;  [facility];  Service: Urology;  Laterality: N/A;    History  Substance Use Topics  . Smoking status: Current Every Day Smoker -- 0.50 packs/day for 7  years    Types: Cigarettes  . Smokeless tobacco: Never Used  . Alcohol Use: No    Family History  Problem Relation Age of Onset  . Adopted: Yes    No Known Allergies  Medication list has been reviewed and updated.  Current Outpatient Prescriptions on File Prior to Visit  Medication Sig Dispense Refill  . ALPRAZolam (XANAX) 0.25 MG tablet Take 0.25 mg by mouth 4 (four) times daily as needed.     . ARIPiprazole (ABILIFY) 10 MG tablet Take 10 mg by mouth daily.    . clobetasol cream (TEMOVATE) 0.05 % Apply 1 application topically 2 (two) times daily as needed.    . ClonazePAM (KLONOPIN PO) Take 10 mg by mouth at bedtime.     Marland Kitchen. lisdexamfetamine (VYVANSE) 50 MG capsule Take 50 mg by mouth every morning.    . loratadine (CLARITIN) 10 MG tablet Take 10 mg by mouth daily.    . QUEtiapine Fumarate (SEROQUEL PO) Take 50 mg by mouth daily.      No current facility-administered medications on file prior to visit.    Review of Systems:  As per HPI- otherwise negative.   Physical Examination: Filed Vitals:   02/09/14 0822  BP: 128/70  Pulse:  120  Temp: 98.5 F (36.9 C)  Resp: 18   Filed Vitals:   02/09/14 0822  Height: 5\' 5"  (1.651 m)  Weight: 195 lb (88.451 kg)   Body mass index is 32.45 kg/(m^2). Ideal Body Weight: Weight in (lb) to have BMI = 25: 149.9  GEN: WDWN, NAD, Non-toxic, A & O x 3, overweight, looks well HEENT: Atraumatic, Normocephalic. Neck supple. No masses, No LAD. Ears and Nose: No external deformity. CV: RRR, No M/G/R. No JVD. No thrill. No extra heart sounds. PULM: CTA B, no wheezes, crackles, rhonchi. No retractions. No resp. distress. No accessory muscle use. ABD: S, NT, ND. No rebound. No HSM. EXTR: No c/c/e NEURO Normal gait.  PSYCH: Normally interactive. Conversant. Not depressed or anxious appearing.  Calm demeanor.  No CVA tenderness.  abd is benign  Results for orders placed or performed in visit on 02/09/14  POCT UA - Microscopic Only   Result Value Ref Range   WBC, Ur, HPF, POC 0-1    RBC, urine, microscopic 7-9    Bacteria, U Microscopic 2+    Mucus, UA Neg    Epithelial cells, urine per micros 0-1    Crystals, Ur, HPF, POC Neg    Casts, Ur, LPF, POC neg    Yeast, UA Neg   POCT urinalysis dipstick  Result Value Ref Range   Color, UA red    Clarity, UA turbid    Glucose, UA 100    Bilirubin, UA large    Ketones, UA 40    Spec Grav, UA 1.015    Blood, UA large    pH, UA 8.5    Protein, UA >=300    Urobilinogen, UA >=8.0    Nitrite, UA positive    Leukocytes, UA large (3+)   POCT glucose (manual entry)  Result Value Ref Range   POC Glucose 84 70 - 99 mg/dl      Assessment and Plan: Acute cystitis with hematuria - Plan: cephALEXin (KEFLEX) 500 MG capsule  Urinary frequency - Plan: POCT UA - Microscopic Only, POCT urinalysis dipstick, Urine culture  Glycosuria - Plan: POCT glucose (manual entry)  Treat for UTI with keflex and azo OTC prn Recommend recheck urine in a couple of weeks with us or her PCP.  Placed future order for this See patient instructions for more details.     Signed Abbe AmsterdamJessica Copland, MD

## 2014-02-09 NOTE — Patient Instructions (Signed)
Your blood sugar looks fine- the appearance of glucose in your urine is likely due to azo use Use the keflex twice a day for one week and drink plenty of water.  You can continue azo as needed.   Let us know if you do not feel better soon!

## 2014-02-12 LAB — URINE CULTURE

## 2014-06-01 ENCOUNTER — Encounter: Payer: Self-pay | Admitting: Gynecology

## 2014-06-01 ENCOUNTER — Ambulatory Visit (INDEPENDENT_AMBULATORY_CARE_PROVIDER_SITE_OTHER): Payer: PRIVATE HEALTH INSURANCE | Admitting: Gynecology

## 2014-06-01 ENCOUNTER — Other Ambulatory Visit (HOSPITAL_COMMUNITY)
Admission: RE | Admit: 2014-06-01 | Discharge: 2014-06-01 | Disposition: A | Discharge status: Home / Self Care | Payer: PRIVATE HEALTH INSURANCE | Source: Ambulatory Visit | Attending: Gynecology | Admitting: Gynecology

## 2014-06-01 VITALS — BP 128/84 | Ht 65.5 in | Wt 188.0 lb

## 2014-06-01 DIAGNOSIS — N898 Other specified noninflammatory disorders of vagina: Secondary | ICD-10-CM

## 2014-06-01 DIAGNOSIS — N760 Acute vaginitis: Secondary | ICD-10-CM | POA: Diagnosis not present

## 2014-06-01 DIAGNOSIS — Z01419 Encounter for gynecological examination (general) (routine) without abnormal findings: Secondary | ICD-10-CM | POA: Diagnosis not present

## 2014-06-01 DIAGNOSIS — Z1151 Encounter for screening for human papillomavirus (HPV): Secondary | ICD-10-CM | POA: Insufficient documentation

## 2014-06-01 DIAGNOSIS — A499 Bacterial infection, unspecified: Secondary | ICD-10-CM | POA: Diagnosis not present

## 2014-06-01 DIAGNOSIS — R635 Abnormal weight gain: Secondary | ICD-10-CM

## 2014-06-01 DIAGNOSIS — B9689 Other specified bacterial agents as the cause of diseases classified elsewhere: Secondary | ICD-10-CM

## 2014-06-01 DIAGNOSIS — F17210 Nicotine dependence, cigarettes, uncomplicated: Secondary | ICD-10-CM | POA: Insufficient documentation

## 2014-06-01 DIAGNOSIS — N9489 Other specified conditions associated with female genital organs and menstrual cycle: Secondary | ICD-10-CM | POA: Diagnosis not present

## 2014-06-01 DIAGNOSIS — Z87891 Personal history of nicotine dependence: Secondary | ICD-10-CM | POA: Insufficient documentation

## 2014-06-01 DIAGNOSIS — Z87410 Personal history of cervical dysplasia: Secondary | ICD-10-CM | POA: Diagnosis not present

## 2014-06-01 LAB — WET PREP FOR TRICH, YEAST, CLUE
Trich, Wet Prep: NONE SEEN
Yeast Wet Prep HPF POC: NONE SEEN

## 2014-06-01 MED ORDER — TINIDAZOLE 500 MG PO TABS
ORAL_TABLET | ORAL | Status: DC
Start: 2014-06-01 — End: 2014-06-26

## 2014-06-01 NOTE — Patient Instructions (Addendum)
Rehresh probiotic Jell you can buy over the counter  Bacterial Vaginosis Bacterial vaginosis is a vaginal infection that occurs when the normal balance of bacteria in the vagina is disrupted. It results from an overgrowth of certain bacteria. This is the most common vaginal infection in women of childbearing age. Treatment is important to prevent complications, especially in pregnant women, as it can cause a premature delivery. CAUSES  Bacterial vaginosis is caused by an increase in harmful bacteria that are normally present in smaller amounts in the vagina. Several different kinds of bacteria can cause bacterial vaginosis. However, the reason that the condition develops is not fully understood. RISK FACTORS Certain activities or behaviors can put you at an increased risk of developing bacterial vaginosis, including:  Having a new sex partner or multiple sex partners.  Douching.  Using an intrauterine device (IUD) for contraception. Women do not get bacterial vaginosis from toilet seats, bedding, swimming pools, or contact with objects around them. SIGNS AND SYMPTOMS  Some women with bacterial vaginosis have no signs or symptoms. Common symptoms include:  Grey vaginal discharge.  A fishlike odor with discharge, especially after sexual intercourse.  Itching or burning of the vagina and vulva.  Burning or pain with urination. DIAGNOSIS  Your health care provider will take a medical history and examine the vagina for signs of bacterial vaginosis. A sample of vaginal fluid may be taken. Your health care provider will look at this sample under a microscope to check for bacteria and abnormal cells. A vaginal pH test may also be done.  TREATMENT  Bacterial vaginosis may be treated with antibiotic medicines. These may be given in the form of a pill or a vaginal cream. A second round of antibiotics may be prescribed if the condition comes back after treatment.  HOME CARE INSTRUCTIONS   Only  take over-the-counter or prescription medicines as directed by your health care provider.  If antibiotic medicine was prescribed, take it as directed. Make sure you finish it even if you start to feel better.  Do not have sex until treatment is completed.  Tell all sexual partners that you have a vaginal infection. They should see their health care provider and be treated if they have problems, such as a mild rash or itching.  Practice safe sex by using condoms and only having one sex partner. SEEK MEDICAL CARE IF:   Your symptoms are not improving after 3 days of treatment.  You have increased discharge or pain.  You have a fever. MAKE SURE YOU:   Understand these instructions.  Will watch your condition.  Will get help right away if you are not doing well or get worse. FOR MORE INFORMATION  Centers for Disease Control and Prevention, Division of STD Prevention: SolutionApps.co.zawww.cdc.gov/std American Sexual Health Association (ASHA): www.ashastd.org  Document Released: 03/03/2005 Document Revised: 12/22/2012 Document Reviewed: 10/13/2012 [facility] Patient Information 2015 [place], [place]. This information is not intended to replace advice given to you by your health care provider. Make sure you discuss any questions you have with your health care provider.  Tinidazole tablets What is this medicine? TINIDAZOLE (tye NI da zole) is an antiinfective. It is used to treat amebiasis, giardiasis, trichomoniasis, and vaginosis. It will not work for colds, flu, or other viral infections. This medicine may be used for other purposes; ask your health care provider or pharmacist if you have questions. COMMON BRAND NAME(S): Tindamax What should I tell my health care provider before I take this medicine? They need to  know if you have any of these conditions: -anemia or other blood disorders -if you frequently drink alcohol containing drinks -receiving hemodialysis -seizure disorder -an unusual or allergic  reaction to tinidazole, other medicines, foods, dyes, or preservatives -pregnant or trying to get pregnant -breast-feeding How should I use this medicine? Take this medicine by mouth with a full glass of water. Follow the directions on the prescription label. Take with food. Take your medicine at regular intervals. Do not take your medicine more often than directed. Take all of your medicine as directed even if you think you are better. Do not skip doses or stop your medicine early. Talk to your pediatrician regarding the use of this medicine in children. While this drug may be prescribed for children as young as 34 years of age for selected conditions, precautions do apply. Overdosage: If you think you have taken too much of this medicine contact a poison control center or emergency room at once. NOTE: This medicine is only for you. Do not share this medicine with others. What if I miss a dose? If you miss a dose, take it as soon as you can. If it is almost time for your next dose, take only that dose. Do not take double or extra doses. What may interact with this medicine? Do not take this medicine with any of the following medications: -alcohol or any product that contains alcohol -amprenavir oral solution -disulfiram -paclitaxel injection -ritonavir oral solution -sertraline oral solution -sulfamethoxazole-trimethoprim injection This medicine may also interact with the following medications: -cholestyramine -cimetidine -conivaptan -cyclosporin -fluorouracil -fosphenytoin, phenytoin -ketoconazole -lithium -phenobarbital -tacrolimus -warfarin This list may not describe all possible interactions. Give your health care provider a list of all the medicines, herbs, non-prescription drugs, or dietary supplements you use. Also tell them if you smoke, drink alcohol, or use illegal drugs. Some items may interact with your medicine. What should I watch for while using this medicine? Tell  your doctor or health care professional if your symptoms do not improve or if they get worse. Avoid alcoholic drinks while you are taking this medicine and for three days afterward. Alcohol may make you feel dizzy, sick, or flushed. If you are being treated for a sexually transmitted disease, avoid sexual contact until you have finished your treatment. Your sexual partner may also need treatment. What side effects may I notice from receiving this medicine? Side effects that you should report to your doctor or health care professional as soon as possible: -allergic reactions like skin rash, itching or hives, swelling of the face, lips, or tongue -breathing problems -confusion, depression -dark or white patches in the mouth -feeling faint or lightheaded, falls -fever, infection -numbness, tingling, pain or weakness in the hands or feet -pain when passing urine -seizures -unusually weak or tired -vaginal irritation or discharge -vomiting Side effects that usually do not require medical attention (report to your doctor or health care professional if they continue or are bothersome): -dark brown or reddish urine -diarrhea -headache -loss of appetite -metallic taste -nausea -stomach upset This list may not describe all possible side effects. Call your doctor for medical advice about side effects. You may report side effects to FDA at 1-800-FDA-1088. Where should I keep my medicine? Keep out of the reach of children. Store at room temperature between 15 and 30 degrees C (59 and 86 degrees F). Protect from light and moisture. Keep container tightly closed. Throw away any unused medicine after the expiration date. NOTE: This sheet is a summary.  It may not cover all possible information. If you have questions about this medicine, talk to your doctor, pharmacist, or health care provider.  2015, Elsevier/Gold Standard. (2007-11-29 15:22:28)

## 2014-06-01 NOTE — Progress Notes (Signed)
Abigail Higgins [birth year] [id number]   History:    34 y.o.  for annual gyn exam with a complaint of vaginal odor per the past few months. She denies any true discharge. She is married and in a monogamous relationship. Review of her records indicated that several years ago she had the HPV vaccine as well as the hep B vaccine. 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 She continues to smoke half a pack cigarette per day. She's been followed by her psychiatrist twice a year for her depression and anxiety Dr. Nolen MuMcKinney. She did receive the flu vaccine in 2015. She had the Mirena IUD placed in December 2013 and has done well with no menstrual cycles and no complaints. Patient smokes half a pack cigarette per day despite having been counseled on numerous occasions.  Past medical history,surgical history, family history and social history were all reviewed and documented in the EPIC chart.  Gynecologic History No LMP recorded. Patient is not currently having periods (Reason: IUD). Contraception: IUD Last Pap: 2013. Results were: normal Last mammogram: 2003. Results were: normal  Obstetric History OB History  Gravida Para Term Preterm AB SAB TAB Ectopic Multiple Living  0                  ROS: A ROS was performed and pertinent positives and negatives are included in the history.  GENERAL: No fevers or chills. HEENT: No change in vision, no earache, sore throat or sinus congestion. NECK: No pain or stiffness. CARDIOVASCULAR: No chest pain or pressure. No palpitations. PULMONARY: No shortness of breath, cough or wheeze. GASTROINTESTINAL: No abdominal pain, nausea, vomiting or diarrhea, melena or bright red blood per rectum. GENITOURINARY: No urinary frequency, urgency, hesitancy or dysuria. MUSCULOSKELETAL: No joint or muscle pain, no back pain, no recent trauma. DERMATOLOGIC: No rash, no itching, no lesions. ENDOCRINE: No polyuria, polydipsia, no heat or cold  intolerance. No recent change in weight. HEMATOLOGICAL: No anemia or easy bruising or bleeding. NEUROLOGIC: No headache, seizures, numbness, tingling or weakness. PSYCHIATRIC: No depression, no loss of interest in normal activity or change in sleep pattern.     Exam: chaperone present  BP 128/84 mmHg  Ht 5' 5.5" (1.664 m)  Wt 188 lb (85.276 kg)  BMI 30.80 kg/m2  Body mass index is 30.8 kg/(m^2).  General appearance : Well developed well nourished female. No acute distress HEENT: Eyes: no retinal hemorrhage or exudates,  Neck supple, trachea midline, no carotid bruits, no thyroidmegaly Lungs: Clear to auscultation, no rhonchi or wheezes, or rib retractions  Heart: Regular rate and rhythm, no murmurs or gallops Breast:Examined in sitting and supine position were symmetrical in appearance, no palpable masses or tenderness,  no skin retraction, no nipple inversion, no nipple discharge, no skin discoloration, no axillary or supraclavicular lymphadenopathy Abdomen: no palpable masses or tenderness, no rebound or guarding Extremities: no edema or skin discoloration or tenderness  Pelvic:  Bartholin, Urethra, Skene Glands: Within normal limits             Vagina: No gross lesions or discharge  Cervix: No gross lesions or discharge, IUD string visualized  Uterus  anteverted, normal size, shape and consistency, non-tender and mobile  Adnexa  Without masses or tenderness  Anus and perineum  normal   Rectovaginal  normal sphincter tone without palpated masses or tenderness             Hemoccult not indicated   Wet prep:  Positive amine, moderate clue cells, few WBC, too numerous to count bacteria  GC and Chlamydia culture pending at time of this dictation  Assessment/Plan:  34 y.o. female for annual exam with clinical evidence of bacterial vaginosis will be treated with Tindamax 500 mg tablets. She will take 4 tablets today and then repeat in 24 hours. A GC and Chlamydia cultures pending at  time of this dictation. Pap smear was done today. Patient will return back later in the week for screening lab work in a fasting state to include the following: Comprehensive metabolic panel, CBC, fasting lipid profile, TSH, and urinalysis. Patient was counseled once again on the detrimental effects of smoking and literature information was provided.   Ok Edwards MD, 5:05 PM 06/01/2014

## 2014-06-02 LAB — GC/CHLAMYDIA PROBE AMP
CT Probe RNA: NEGATIVE
GC Probe RNA: NEGATIVE

## 2014-06-05 ENCOUNTER — Other Ambulatory Visit: Payer: PRIVATE HEALTH INSURANCE

## 2014-06-05 LAB — COMPREHENSIVE METABOLIC PANEL
ALT: 21 U/L (ref 0–35)
AST: 22 U/L (ref 0–37)
Albumin: 4.5 g/dL (ref 3.5–5.2)
Alkaline Phosphatase: 79 U/L (ref 39–117)
BILIRUBIN TOTAL: 0.9 mg/dL (ref 0.2–1.2)
BUN: 14 mg/dL (ref 6–23)
CO2: 23 mEq/L (ref 19–32)
Calcium: 9.3 mg/dL (ref 8.4–10.5)
Chloride: 104 mEq/L (ref 96–112)
Creat: 0.79 mg/dL (ref 0.50–1.10)
Glucose, Bld: 95 mg/dL (ref 70–99)
Potassium: 4.5 mEq/L (ref 3.5–5.3)
Sodium: 137 mEq/L (ref 135–145)
TOTAL PROTEIN: 6.9 g/dL (ref 6.0–8.3)

## 2014-06-05 LAB — CBC WITH DIFFERENTIAL/PLATELET
BASOS ABS: 0 10*3/uL (ref 0.0–0.1)
BASOS PCT: 0 % (ref 0–1)
EOS ABS: 0.2 10*3/uL (ref 0.0–0.7)
Eosinophils Relative: 3 % (ref 0–5)
HCT: 42.8 % (ref 36.0–46.0)
HEMOGLOBIN: 14.6 g/dL (ref 12.0–15.0)
LYMPHS ABS: 2 10*3/uL (ref 0.7–4.0)
LYMPHS PCT: 28 % (ref 12–46)
MCH: 31.1 pg (ref 26.0–34.0)
MCHC: 34.1 g/dL (ref 30.0–36.0)
MCV: 91.3 fL (ref 78.0–100.0)
MONOS PCT: 4 % (ref 3–12)
MPV: 9.5 fL (ref 8.6–12.4)
Monocytes Absolute: 0.3 10*3/uL (ref 0.1–1.0)
Neutro Abs: 4.7 10*3/uL (ref 1.7–7.7)
Neutrophils Relative %: 65 % (ref 43–77)
Platelets: 273 10*3/uL (ref 150–400)
RBC: 4.69 MIL/uL (ref 3.87–5.11)
RDW: 14.1 % (ref 11.5–15.5)
WBC: 7.2 10*3/uL (ref 4.0–10.5)

## 2014-06-05 LAB — TSH: TSH: 0.951 u[IU]/mL (ref 0.350–4.500)

## 2014-06-05 LAB — LIPID PANEL
CHOL/HDL RATIO: 3.9 ratio
CHOLESTEROL: 222 mg/dL — AB (ref 0–200)
HDL: 57 mg/dL (ref >=46)
LDL Cholesterol: 149 mg/dL — ABNORMAL HIGH (ref 0–99)
TRIGLYCERIDES: 81 mg/dL (ref <150)
VLDL: 16 mg/dL (ref 0–40)

## 2014-06-06 LAB — CYTOLOGY - PAP

## 2014-06-12 ENCOUNTER — Other Ambulatory Visit: Payer: Self-pay | Admitting: Gynecology

## 2014-06-12 MED ORDER — FLUCONAZOLE 150 MG PO TABS: 150.0000 mg | ORAL_TABLET | Freq: Once | ORAL | Status: DC

## 2014-06-26 ENCOUNTER — Ambulatory Visit (INDEPENDENT_AMBULATORY_CARE_PROVIDER_SITE_OTHER): Payer: PRIVATE HEALTH INSURANCE | Admitting: Internal Medicine

## 2014-06-26 ENCOUNTER — Encounter: Payer: Self-pay | Admitting: Internal Medicine

## 2014-06-26 VITALS — BP 115/80 | HR 80 | Temp 98.2°F | Ht 65.0 in | Wt 190.0 lb

## 2014-06-26 DIAGNOSIS — Z23 Encounter for immunization: Secondary | ICD-10-CM | POA: Diagnosis not present

## 2014-06-26 DIAGNOSIS — F17210 Nicotine dependence, cigarettes, uncomplicated: Secondary | ICD-10-CM

## 2014-06-26 DIAGNOSIS — E785 Hyperlipidemia, unspecified: Secondary | ICD-10-CM | POA: Diagnosis not present

## 2014-06-26 NOTE — Patient Instructions (Addendum)
Please try to quit smoking (enroll in smoking cessation class, use nicotine replacement products if needed) Follow low saturated fat diet Please complete the following lab tests before your next follow up appointment: FLP, HS CRP - use hyperlipidemia code

## 2014-06-26 NOTE — Assessment & Plan Note (Addendum)
It may be difficult for patient to quit smoking given her psychiatric history.  Trial of nicotine replacement products.  Patient given pneumococcal vaccine.

## 2014-06-26 NOTE — Progress Notes (Signed)
Subjective:    Patient ID: Abigail Higgins, female    DOB: Jul 13, 1980, 34 y.o.   MRN: [id number]  HPI  34 year old white female with history of mood disorder and chronic tobacco use referred by her gynecologist regarding hyperlipidemia. Patient's recent lipid panel reviewed.   Lab Results  Component Value Date   CHOL 222* 06/05/2014   HDL 57 06/05/2014   LDLCALC 149* 06/05/2014   LDLDIRECT 145.6 03/28/2013   TRIG 81 06/05/2014   CHOLHDL 3.9 06/05/2014   Patient is married. Her husband does most of the cooking. She eats red meat at least twice a week.  Also moderate intake of saturated fats from cheese and other dairy products.  Patient adopted she does not know her family history.  She smokes 1/2 ppd.  She has tried vapor e cigarettes.   Review of Systems Negative for chest pain, shortness of breath or palpitations    Past Medical History  Diagnosis Date  . Depression   . CIN I (cervical intraepithelial neoplasia I)   . Anxiety   . Bipolar 1 disorder   . Urethral disorder URETHRAL SPRAYING  . Mild acid reflux WATCHES DIET  . ADD (attention deficit disorder)     History   Social History  . Marital Status: Single    Spouse Name: N/A  . Number of Children: N/A  . Years of Education: N/A   Occupational History  . Not on file.   Social History Main Topics  . Smoking status: Current Every Day Smoker -- 0.50 packs/day for 7 years    Types: Cigarettes  . Smokeless tobacco: Never Used  . Alcohol Use: No  . Drug Use: No  . Sexual Activity: Yes    Birth Control/ Protection: IUD     Comment: 1st intercourse- 13, partners- greater than 5   Other Topics Concern  . 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 Rhinoplasty  AGE 82  . Colposcopy    . Intrauterine  device insertion  03-08-2012    MIRENA  . Tonsillectomy  AGE 37  . Cystoscopy with urethral dilatation N/A 08/02/2012    Procedure: CYSTOSCOPY WITH URETHRAL DILATATION WITH INSTILLATION OF PYRIDIUM AND PELVIC EXAM;  Surgeon: Kathi LudwigSigmund I Tannenbaum, MD;  [facility];  Service: Urology;  Laterality: N/A;    Family History  Problem Relation Age of Onset  . Adopted: Yes    No Known Allergies  Current Outpatient Prescriptions on File Prior to Visit  Medication Sig Dispense Refill  . ALPRAZolam (XANAX) 0.25 MG tablet Take 0.25 mg by mouth 4 (four) times daily as needed.     . ARIPiprazole (ABILIFY) 10 MG tablet Take 10 mg by mouth daily.    . clobetasol cream (TEMOVATE) 0.05 % Apply 1 application topically 2 (two) times daily as needed.    . ClonazePAM (KLONOPIN PO) Take 10 mg by mouth at bedtime.     Marland Kitchen. lisdexamfetamine (VYVANSE) 50 MG capsule Take 50 mg by mouth every morning.    . loratadine (CLARITIN) 10 MG tablet Take 10 mg by mouth daily.    . QUEtiapine Fumarate (SEROQUEL PO) Take 50 mg by mouth daily.      No current facility-administered medications on file prior to visit.  BP 115/80 mmHg  Pulse 80  Temp(Src) 98.2 F (36.8 C) (Oral)  Ht  (1.651 m)  Wt 190 lb (86.183 kg)  BMI 31.62 kg/m2    Objective:   Physical Exam  Constitutional: She is oriented to person, place, and time. She appears well-developed and well-nourished. No distress.  HENT:  Head: Normocephalic and atraumatic.  Right Ear: External ear normal.  Left Ear: External ear normal.  Eyes: EOM are normal. Pupils are equal, round, and reactive to light.  Neck: Neck supple.  No carotid bruit   Cardiovascular: Normal rate, regular rhythm and normal heart sounds.   No murmur heard. Pulmonary/Chest: Effort normal and breath sounds normal. She has no wheezes.  Abdominal: Soft. Bowel sounds are normal.  Neurological: She is alert and oriented to person, place, and time. No cranial  nerve deficit.  Skin: Skin is warm and dry.  Psychiatric: She has a normal mood and affect. Her behavior is normal.         Assessment & Plan:

## 2014-06-26 NOTE — Progress Notes (Signed)
Pre visit review using our clinic review tool, if applicable. No additional management support is needed unless otherwise documented below in the visit note. 

## 2014-06-26 NOTE — Assessment & Plan Note (Signed)
Patient's lifetime ASCVD risk calculated at 39%. Optimal risk is 8%. I stressed the importance of tobacco cessation. Reviewed low saturated fat diet. Educational handout provided. Repeat fasting lipid panel and CRP in 3 months.  She is currently using birth control.  We discussed risks of using statins if she were to become pregnant.  She currently does not express any interest in starting family.  Regular exercise also encouraged.

## 2014-07-06 ENCOUNTER — Encounter: Payer: Self-pay | Admitting: Women's Health

## 2014-07-06 ENCOUNTER — Ambulatory Visit (INDEPENDENT_AMBULATORY_CARE_PROVIDER_SITE_OTHER): Payer: PRIVATE HEALTH INSURANCE | Admitting: Women's Health

## 2014-07-06 VITALS — BP 124/80 | Ht 65.0 in | Wt 187.0 lb

## 2014-07-06 DIAGNOSIS — R350 Frequency of micturition: Secondary | ICD-10-CM

## 2014-07-06 DIAGNOSIS — B373 Candidiasis of vulva and vagina: Secondary | ICD-10-CM

## 2014-07-06 DIAGNOSIS — B3731 Acute candidiasis of vulva and vagina: Secondary | ICD-10-CM

## 2014-07-06 DIAGNOSIS — N898 Other specified noninflammatory disorders of vagina: Secondary | ICD-10-CM

## 2014-07-06 LAB — URINALYSIS W MICROSCOPIC + REFLEX CULTURE
Bilirubin Urine: NEGATIVE
CRYSTALS: NONE SEEN
Casts: NONE SEEN
Glucose, UA: NEGATIVE mg/dL
Hgb urine dipstick: NEGATIVE
Ketones, ur: NEGATIVE mg/dL
NITRITE: NEGATIVE
RBC / HPF: NONE SEEN RBC/hpf (ref <3)
UROBILINOGEN UA: 0.2 mg/dL (ref 0.0–1.0)
pH: 6.5 (ref 5.0–8.0)

## 2014-07-06 LAB — WET PREP FOR TRICH, YEAST, CLUE
Clue Cells Wet Prep HPF POC: NONE SEEN
Trich, Wet Prep: NONE SEEN

## 2014-07-06 MED ORDER — FLUCONAZOLE 150 MG PO TABS: ORAL_TABLET | ORAL | Status: DC

## 2014-07-06 NOTE — Progress Notes (Signed)
Patient ID: Abigail Higgins, female   DOB: 24-Jun-1980, 34 y.o.   MRN: [id number] Presents with complaint of increased urinary frequency with pain at initiation of urination, vaginal irritation with discharge. Was treated for bacteria vaginosis 05/2014. Negative GC/Chlamydia. Amenorrheic with Mirena IUD. Denies abdominal pain, fever.  Exam: Appears well. External genitalia extremely erythematous at introitus, wet prep done with Q-tip. Wet prep positive for yeast. UA: Trace leukocytes, 3-6 WBCs, many bacteria, many squamous epithelials.  Yeast vaginitis  Plan: Diflucan 150 by mouth today repeat in 3 days if needed. Prescription, proper use, yeast prevention discussed. Instructed to call if no relief of irritation. Urine culture pending.

## 2014-07-06 NOTE — Patient Instructions (Signed)

## 2014-07-07 LAB — URINE CULTURE
COLONY COUNT: NO GROWTH
Organism ID, Bacteria: NO GROWTH

## 2014-07-31 ENCOUNTER — Telehealth: Payer: Self-pay | Admitting: *Deleted

## 2014-07-31 NOTE — Telephone Encounter (Signed)
This appears to be a recurrent problem may be somethingI just she states she does need to be seen in the office. Or she can try Monistat for 3 days over-the-counter if no resolution then come back to the office for additional studies

## 2014-07-31 NOTE — Telephone Encounter (Signed)
Pt informed with the below, transferred to front desk. 

## 2014-07-31 NOTE — Telephone Encounter (Signed)
(  You are the back up MD) pt was seen on 07/06/14 treated for yeast infection, completed dose of diflucan #2 tablet. Pt said this past Friday yeast returned itching and white discharge, no odor. Pt asked if Rx could be given to help with this? Please advise

## 2014-08-01 ENCOUNTER — Ambulatory Visit (INDEPENDENT_AMBULATORY_CARE_PROVIDER_SITE_OTHER): Payer: PRIVATE HEALTH INSURANCE | Admitting: Gynecology

## 2014-08-01 ENCOUNTER — Encounter: Payer: Self-pay | Admitting: Gynecology

## 2014-08-01 VITALS — BP 118/76

## 2014-08-01 DIAGNOSIS — N898 Other specified noninflammatory disorders of vagina: Secondary | ICD-10-CM

## 2014-08-01 DIAGNOSIS — R300 Dysuria: Secondary | ICD-10-CM

## 2014-08-01 DIAGNOSIS — B373 Candidiasis of vulva and vagina: Secondary | ICD-10-CM | POA: Diagnosis not present

## 2014-08-01 DIAGNOSIS — N9089 Other specified noninflammatory disorders of vulva and perineum: Secondary | ICD-10-CM | POA: Diagnosis not present

## 2014-08-01 DIAGNOSIS — B3731 Acute candidiasis of vulva and vagina: Secondary | ICD-10-CM

## 2014-08-01 DIAGNOSIS — Z113 Encounter for screening for infections with a predominantly sexual mode of transmission: Secondary | ICD-10-CM

## 2014-08-01 LAB — URINALYSIS W MICROSCOPIC + REFLEX CULTURE
BILIRUBIN URINE: NEGATIVE
CASTS: NONE SEEN
CRYSTALS: NONE SEEN
Glucose, UA: NEGATIVE mg/dL
KETONES UR: NEGATIVE mg/dL
NITRITE: NEGATIVE
PH: 5 (ref 5.0–8.0)
Protein, ur: NEGATIVE mg/dL
Urobilinogen, UA: 0.2 mg/dL (ref 0.0–1.0)

## 2014-08-01 LAB — WET PREP FOR TRICH, YEAST, CLUE: Trich, Wet Prep: NONE SEEN

## 2014-08-01 LAB — HEMOGLOBIN A1C
Hgb A1c MFr Bld: 5.7 % — ABNORMAL HIGH (ref <5.7)
Mean Plasma Glucose: 117 mg/dL — ABNORMAL HIGH (ref <117)

## 2014-08-01 MED ORDER — NITROFURANTOIN MONOHYD MACRO 100 MG PO CAPS: 100.0000 mg | ORAL_CAPSULE | Freq: Two times a day (BID) | ORAL | Status: DC

## 2014-08-01 NOTE — Progress Notes (Signed)
    Patient is a 34 year old who is had a Mirena IUD in place complaining of a rusty-like discharge and some dysuria and some frequency and external pruritus. She is in M in August relationship. She has been treating the past for yeast infection. Patient denies any fever, chills, nausea, vomiting or any back pain.  Exam: Blood pressure 118/76 Well-developed well-nourished white female above-mentioned complaint Pelvic: Bartholin urethra Skene was within normal limits Vagina: No gross lesions on inspection Cervix: No gross lesions on inspection IUD string visualized Uterus: Anteverted normal size shape and consistency Adnexa: No palpable mass or tenderness Rectal exam not done  Urinalysis: 20-50s any PC, 11-20 RBC, many bacteria and yeast were noted  Wet prep moderate yeast  I've noted few months ago patient had a negative GC and Chlamydia culture  Assessment/plan: #1 recurrent yeast infection will check an HIV test along with a hemoglobin A1c. She is going to be started on boric acid 600 mg capsule to apply intravaginally daily at bedtime for 2 weeks followed by Diflucan 150 mg one by mouth every weekly for 3 months. She will also by over-the-counter refresh a probiotic tablet and take 1 daily. #2 for urinary tract infection she'll be prescribed Macrobid one by mouth twice a day for 7 days.

## 2014-08-01 NOTE — Patient Instructions (Signed)
Monilial Vaginitis Vaginitis in a soreness, swelling and redness (inflammation) of the vagina and vulva. Monilial vaginitis is not a sexually transmitted infection. CAUSES  Yeast vaginitis is caused by yeast (candida) that is normally found in your vagina. With a yeast infection, the candida has overgrown in number to a point that upsets the chemical balance. SYMPTOMS   White, thick vaginal discharge.  Swelling, itching, redness and irritation of the vagina and possibly the lips of the vagina (vulva).  Burning or painful urination.  Painful intercourse. DIAGNOSIS  Things that may contribute to monilial vaginitis are:  Postmenopausal and virginal states.  Pregnancy.  Infections.  Being tired, sick or stressed, especially if you had monilial vaginitis in the past.  Diabetes. Good control will help lower the chance.  Birth control pills.  Tight fitting garments.  Using bubble bath, feminine sprays, douches or deodorant tampons.  Taking certain medications that kill germs (antibiotics).  Sporadic recurrence can occur if you become ill. TREATMENT  Your caregiver will give you medication.  There are several kinds of anti monilial vaginal creams and suppositories specific for monilial vaginitis. For recurrent yeast infections, use a suppository or cream in the vagina 2 times a week, or as directed.  Anti-monilial or steroid cream for the itching or irritation of the vulva may also be used. Get your caregiver's permission.  Painting the vagina with methylene blue solution may help if the monilial cream does not work.  Eating yogurt may help prevent monilial vaginitis. HOME CARE INSTRUCTIONS   Finish all medication as prescribed.  Do not have sex until treatment is completed or after your caregiver tells you it is okay.  Take warm sitz baths.  Do not douche.  Do not use tampons, especially scented ones.  Wear cotton underwear.  Avoid tight pants and panty  hose.  Tell your sexual partner that you have a yeast infection. They should go to their caregiver if they have symptoms such as mild rash or itching.  Your sexual partner should be treated as well if your infection is difficult to eliminate.  Practice safer sex. Use condoms.  Some vaginal medications cause latex condoms to fail. Vaginal medications that harm condoms are:  Cleocin cream.  Butoconazole (Femstat).  Terconazole (Terazol) vaginal suppository.  Miconazole (Monistat) (may be purchased over the counter). SEEK MEDICAL CARE IF:   You have a temperature by mouth above 102 F (38.9 C).  The infection is getting worse after 2 days of treatment.  The infection is not getting better after 3 days of treatment.  You develop blisters in or around your vagina.  You develop vaginal bleeding, and it is not your menstrual period.  You have pain when you urinate.  You develop intestinal problems.  You have pain with sexual intercourse. Document Released: 12/11/2004 Document Revised: 05/26/2011 Document Reviewed: 08/25/2008 ExitCare Patient Information 2015 [employer]. This information is not intended to replace advice given to you by your health care provider. Make sure you discuss any questions you have with your health care provider. Urinary Tract Infection Urinary tract infections (UTIs) can develop anywhere along your urinary tract. Your urinary tract is your body's drainage system for removing wastes and extra water. Your urinary tract includes two kidneys, two ureters, a bladder, and a urethra. Your kidneys are a pair of bean-shaped organs. Each kidney is about the size of your fist. They are located below your ribs, one on each side of your spine. CAUSES Infections are caused by microbes, which are   microscopic organisms, including fungi, viruses, and bacteria. These organisms are so small that they can only be seen through a microscope. Bacteria are the microbes that  most commonly cause UTIs. SYMPTOMS  Symptoms of UTIs may vary by age and gender of the patient and by the location of the infection. Symptoms in young women typically include a frequent and intense urge to urinate and a painful, burning feeling in the bladder or urethra during urination. Older women and men are more likely to be tired, shaky, and weak and have muscle aches and abdominal pain. A fever may mean the infection is in your kidneys. Other symptoms of a kidney infection include pain in your back or sides below the ribs, nausea, and vomiting. DIAGNOSIS To diagnose a UTI, your caregiver will ask you about your symptoms. Your caregiver also will ask to provide a urine sample. The urine sample will be tested for bacteria and white blood cells. White blood cells are made by your body to help fight infection. TREATMENT  Typically, UTIs can be treated with medication. Because most UTIs are caused by a bacterial infection, they usually can be treated with the use of antibiotics. The choice of antibiotic and length of treatment depend on your symptoms and the type of bacteria causing your infection. HOME CARE INSTRUCTIONS  If you were prescribed antibiotics, take them exactly as your caregiver instructs you. Finish the medication even if you feel better after you have only taken some of the medication.  Drink enough water and fluids to keep your urine clear or pale yellow.  Avoid caffeine, tea, and carbonated beverages. They tend to irritate your bladder.  Empty your bladder often. Avoid holding urine for long periods of time.  Empty your bladder before and after sexual intercourse.  After a bowel movement, women should cleanse from front to back. Use each tissue only once. SEEK MEDICAL CARE IF:   You have back pain.  You develop a fever.  Your symptoms do not begin to resolve within 3 days. SEEK IMMEDIATE MEDICAL CARE IF:   You have severe back pain or lower abdominal pain.  You  develop chills.  You have nausea or vomiting.  You have continued burning or discomfort with urination. MAKE SURE YOU:   Understand these instructions.  Will watch your condition.  Will get help right away if you are not doing well or get worse. Document Released: 12/11/2004 Document Revised: 09/02/2011 Document Reviewed: 04/11/2011 ExitCare Patient Information 2015 [employer]. This information is not intended to replace advice given to you by your health care provider. Make sure you discuss any questions you have with your health care provider.  

## 2014-08-02 LAB — HIV ANTIBODY (ROUTINE TESTING W REFLEX): HIV 1&2 Ab, 4th Generation: NONREACTIVE

## 2014-08-03 LAB — URINE CULTURE
Colony Count: NO GROWTH
Organism ID, Bacteria: NO GROWTH

## 2014-09-25 ENCOUNTER — Other Ambulatory Visit (INDEPENDENT_AMBULATORY_CARE_PROVIDER_SITE_OTHER): Payer: PRIVATE HEALTH INSURANCE

## 2014-09-25 DIAGNOSIS — E785 Hyperlipidemia, unspecified: Secondary | ICD-10-CM | POA: Diagnosis not present

## 2014-09-25 DIAGNOSIS — O99810 Abnormal glucose complicating pregnancy: Secondary | ICD-10-CM | POA: Diagnosis not present

## 2014-09-25 LAB — HIGH SENSITIVITY CRP: CRP, High Sensitivity: 1.88 mg/L (ref 0.000–5.000)

## 2014-09-25 LAB — LIPID PANEL
CHOL/HDL RATIO: 5
Cholesterol: 246 mg/dL — ABNORMAL HIGH (ref 0–200)
HDL: 49.8 mg/dL (ref >39.00)
LDL CALC: 174 mg/dL — AB (ref 0–99)
NonHDL: 196.2
TRIGLYCERIDES: 112 mg/dL (ref 0.0–149.0)
VLDL: 22.4 mg/dL (ref 0.0–40.0)

## 2014-09-25 LAB — HEMOGLOBIN A1C: Hgb A1c MFr Bld: 5.4 % (ref 4.6–6.5)

## 2014-09-25 NOTE — Addendum Note (Signed)
Addended by: Bonnye FavaKWEI, NANA K on: 09/25/2014 09:22 AM   Modules accepted: Orders

## 2014-09-29 ENCOUNTER — Ambulatory Visit (INDEPENDENT_AMBULATORY_CARE_PROVIDER_SITE_OTHER): Payer: PRIVATE HEALTH INSURANCE | Admitting: Internal Medicine

## 2014-09-29 ENCOUNTER — Encounter: Payer: Self-pay | Admitting: Internal Medicine

## 2014-09-29 VITALS — BP 130/90 | HR 88 | Temp 98.9°F | Wt 184.0 lb

## 2014-09-29 DIAGNOSIS — H6990 Unspecified Eustachian tube disorder, unspecified ear: Secondary | ICD-10-CM | POA: Insufficient documentation

## 2014-09-29 DIAGNOSIS — H6980 Other specified disorders of Eustachian tube, unspecified ear: Secondary | ICD-10-CM | POA: Insufficient documentation

## 2014-09-29 DIAGNOSIS — F319 Bipolar disorder, unspecified: Secondary | ICD-10-CM | POA: Diagnosis not present

## 2014-09-29 DIAGNOSIS — H6981 Other specified disorders of Eustachian tube, right ear: Secondary | ICD-10-CM | POA: Diagnosis not present

## 2014-09-29 DIAGNOSIS — E785 Hyperlipidemia, unspecified: Secondary | ICD-10-CM

## 2014-09-29 NOTE — Progress Notes (Signed)
Subjective:    Patient ID: Abigail Higgins, female    DOB: [birth year], 34 y.o.   MRN: [id number]  HPI  34 year old white female with history of bipolar disorder and hyperlipidemia follow-up. Patient reports trying her best to follow low saturated fat diet. We reviewed her recent lipid panel and CRP. Her LDL is actually higher. Patient mainly eats 1 meal per day.  She tries to consume lean meats.  CRP was 1.880  Patient is on Abilify. She has had elevated blood sugar levels in the past. Her A1c is normal at 5.4  Patient trying her best to quit smoking. She started using routine patch on 09/22/2014  Patient complains of mild discomfort in right ear. She denies any hearing loss  Review of Systems Intermittent constipation, weight is stable  Wt Readings from Last 3 Encounters:  09/29/14 184 lb (83.462 kg)  07/06/14 187 lb (84.823 kg)  06/26/14 190 lb (86.183 kg)       Past Medical History  Diagnosis Date  . Depression   . CIN I (cervical intraepithelial neoplasia I)   . Anxiety   . Bipolar 1 disorder   . Urethral disorder URETHRAL SPRAYING  . Mild acid reflux WATCHES DIET  . ADD (attention deficit disorder)     History   Social History  . Marital Status: Single    Spouse Name: N/A  . Number of Children: N/A  . Years of Education: N/A   Occupational History  . Not on file.   Social History Main Topics  . Smoking status: Current Every Day Smoker -- 0.50 packs/day for 7 years    Types: Cigarettes  . Smokeless tobacco: Never Used  . Alcohol Use: No  . Drug Use: No  . Sexual Activity: Yes    Birth Control/ Protection: IUD     Comment: 1st intercourse- 13, partners- greater than 5   Other Topics Concern  . 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  Rhinoplasty  AGE 34  . Colposcopy    . Intrauterine device insertion  03-08-2012    MIRENA  . Tonsillectomy  AGE 22  . Cystoscopy with urethral dilatation N/A 08/02/2012    Procedure: CYSTOSCOPY WITH URETHRAL DILATATION WITH INSTILLATION OF PYRIDIUM AND PELVIC EXAM;  Surgeon: Kathi Ludwig, MD;  Location: [facility];  Service: Urology;  Laterality: N/A;    Family History  Problem Relation Age of Onset  . Adopted: Yes    No Known Allergies  Current Outpatient Prescriptions on File Prior to Visit  Medication Sig Dispense Refill  . ALPRAZolam (XANAX) 0.25 MG tablet Take 0.25 mg by mouth 4 (four) times daily as needed.     . ARIPiprazole (ABILIFY) 10 MG tablet Take 10 mg by mouth daily.    . clobetasol cream (TEMOVATE) 0.05 % Apply 1 application topically 2 (two) times daily as needed.    . ClonazePAM (KLONOPIN PO) Take 10 mg by mouth at bedtime.     . fluconazole (DIFLUCAN) 150 MG tablet Take one tablet today and repeat in 3 days if needed 2 tablet 1  . levonorgestrel (MIRENA) 20 MCG/24HR IUD 1 each by Intrauterine route once.    . lisdexamfetamine (VYVANSE) 50 MG capsule Take 50 mg by mouth  every morning.    . loratadine (CLARITIN) 10 MG tablet Take 10 mg by mouth daily.    . nitrofurantoin, macrocrystal-monohydrate, (MACROBID) 100 MG capsule Take 1 capsule (100 mg total) by mouth 2 (two) times daily. 14 capsule 0  . QUEtiapine Fumarate (SEROQUEL PO) Take 50 mg by mouth daily.      No current facility-administered medications on file prior to visit.    BP 130/90 mmHg  Pulse 88  Temp(Src) 98.9 F (37.2 C) (Oral)  Wt 184 lb (83.462 kg)    Objective:   Physical Exam  Constitutional: She is oriented to person, place, and time. She appears well-developed and well-nourished.  HENT:  Head: Normocephalic and atraumatic.  Left Ear: External ear normal.  Right tympanic membrane slightly retracted, no erythema  Cardiovascular: Normal rate, regular rhythm and  normal heart sounds.   No murmur heard. Pulmonary/Chest: Effort normal and breath sounds normal. She has no wheezes.  Neurological: She is alert and oriented to person, place, and time. No cranial nerve deficit.  Skin: Skin is dry.          Assessment & Plan:

## 2014-09-29 NOTE — Assessment & Plan Note (Signed)
Continue dietary changes. I would like to avoid statin therapy at this time.  Add soluble fiber supplement.  Reassess in 1 year.  Encouraged patient to continue smoking cessation efforts.

## 2014-09-29 NOTE — Progress Notes (Signed)
Pre visit review using our clinic review tool, if applicable. No additional management support is needed unless otherwise documented below in the visit note. 

## 2014-09-29 NOTE — Assessment & Plan Note (Signed)
Patient has had intermittent right ear symptoms last month 2 weeks. Her symptoms likely secondary to eustachian tube dysfunction. Patient advised to use over-the-counter Flonase for 1-2 weeks. She can also continue over-the-counter Sudafed as needed.

## 2014-09-29 NOTE — Patient Instructions (Signed)
Use over the counter flonase as directed for right ear symptoms Use Metamucile or Citracel fiber supplement once or twice daily as directed Continue to follow low saturated fat diet Please complete the following lab tests before your next follow up appointment: CPX labs including A1c - 272.4, 790.29

## 2014-09-29 NOTE — Assessment & Plan Note (Signed)
Patient's A1c is normal at 5.4. Patient encouraged to monitor weight closely. We discussed earlier reevaluation if she experiences unexpected weight gain.

## 2014-11-30 ENCOUNTER — Ambulatory Visit (INDEPENDENT_AMBULATORY_CARE_PROVIDER_SITE_OTHER): Payer: PRIVATE HEALTH INSURANCE | Admitting: Women's Health

## 2014-11-30 ENCOUNTER — Encounter: Payer: Self-pay | Admitting: Women's Health

## 2014-11-30 VITALS — BP 128/80 | Ht 65.0 in | Wt 184.0 lb

## 2014-11-30 DIAGNOSIS — N3001 Acute cystitis with hematuria: Secondary | ICD-10-CM

## 2014-11-30 DIAGNOSIS — R350 Frequency of micturition: Secondary | ICD-10-CM

## 2014-11-30 DIAGNOSIS — B9689 Other specified bacterial agents as the cause of diseases classified elsewhere: Secondary | ICD-10-CM

## 2014-11-30 DIAGNOSIS — F1021 Alcohol dependence, in remission: Secondary | ICD-10-CM | POA: Diagnosis not present

## 2014-11-30 DIAGNOSIS — B373 Candidiasis of vulva and vagina: Secondary | ICD-10-CM | POA: Diagnosis not present

## 2014-11-30 DIAGNOSIS — N760 Acute vaginitis: Secondary | ICD-10-CM | POA: Diagnosis not present

## 2014-11-30 DIAGNOSIS — B3731 Acute candidiasis of vulva and vagina: Secondary | ICD-10-CM

## 2014-11-30 DIAGNOSIS — N898 Other specified noninflammatory disorders of vagina: Secondary | ICD-10-CM

## 2014-11-30 DIAGNOSIS — A499 Bacterial infection, unspecified: Secondary | ICD-10-CM

## 2014-11-30 LAB — URINALYSIS W MICROSCOPIC + REFLEX CULTURE
Bilirubin Urine: NEGATIVE
Casts: NONE SEEN [LPF]
Crystals: NONE SEEN [HPF]
GLUCOSE, UA: NEGATIVE
Ketones, ur: NEGATIVE
NITRITE: POSITIVE — AB
PH: 5.5 (ref 5.0–8.0)
SPECIFIC GRAVITY, URINE: 1.025 (ref 1.001–1.035)
YEAST: NONE SEEN [HPF]

## 2014-11-30 LAB — WET PREP FOR TRICH, YEAST, CLUE: Trich, Wet Prep: NONE SEEN

## 2014-11-30 MED ORDER — FLUCONAZOLE 150 MG PO TABS: 150.0000 mg | ORAL_TABLET | Freq: Once | ORAL | Status: DC

## 2014-11-30 MED ORDER — SULFAMETHOXAZOLE-TRIMETHOPRIM 800-160 MG PO TABS: 1.0000 | ORAL_TABLET | Freq: Two times a day (BID) | ORAL | Status: DC

## 2014-11-30 MED ORDER — METRONIDAZOLE 0.75 % VA GEL: VAGINAL | Status: DC

## 2014-11-30 NOTE — Patient Instructions (Addendum)
Bacterial Vaginosis Bacterial vaginosis is an infection of the vagina. It happens when too many of certain germs (bacteria) grow in the vagina. HOME CARE  Take your medicine as told by your doctor.  Finish your medicine even if you start to feel better.  Do not have sex until you finish your medicine and are better.  Tell your sex partner that you have an infection. They should see their doctor for treatment.  Practice safe sex. Use condoms. Have only one sex partner. GET HELP IF:  You are not getting better after 3 days of treatment.  You have more grey fluid (discharge) coming from your vagina than before.  You have more pain than before.  You have a fever. MAKE SURE YOU:   Understand these instructions.  Will watch your condition.  Will get help right away if you are not doing well or get worse. Document Released: 12/11/2007 Document Revised: 12/22/2012 Document Reviewed: 10/13/2012 ExitCare Patient Information 2015 [employer]. This information is not intended to replace advice given to you by your health care provider. Make sure you discuss any questions you have with your health care provider. Monilial Vaginitis Vaginitis in a soreness, swelling and redness (inflammation) of the vagina and vulva. Monilial vaginitis is not a sexually transmitted infection. CAUSES  Yeast vaginitis is caused by yeast (candida) that is normally found in your vagina. With a yeast infection, the candida has overgrown in number to a point that upsets the chemical balance. SYMPTOMS   White, thick vaginal discharge.  Swelling, itching, redness and irritation of the vagina and possibly the lips of the vagina (vulva).  Burning or painful urination.  Painful intercourse. DIAGNOSIS  Things that may contribute to monilial vaginitis are:  Postmenopausal and virginal states.  Pregnancy.  Infections.  Being tired, sick or stressed, especially if you had monilial vaginitis in the  past.  Diabetes. Good control will help lower the chance.  Birth control pills.  Tight fitting garments.  Using bubble bath, feminine sprays, douches or deodorant tampons.  Taking certain medications that kill germs (antibiotics).  Sporadic recurrence can occur if you become ill. TREATMENT  Your caregiver will give you medication.  There are several kinds of anti monilial vaginal creams and suppositories specific for monilial vaginitis. For recurrent yeast infections, use a suppository or cream in the vagina 2 times a week, or as directed.  Anti-monilial or steroid cream for the itching or irritation of the vulva may also be used. Get your caregiver's permission.  Painting the vagina with methylene blue solution may help if the monilial cream does not work.  Eating yogurt may help prevent monilial vaginitis. HOME CARE INSTRUCTIONS   Finish all medication as prescribed.  Do not have sex until treatment is completed or after your caregiver tells you it is okay.  Take warm sitz baths.  Do not douche.  Do not use tampons, especially scented ones.  Wear cotton underwear.  Avoid tight pants and panty hose.  Tell your sexual partner that you have a yeast infection. They should go to their caregiver if they have symptoms such as mild rash or itching.  Your sexual partner should be treated as well if your infection is difficult to eliminate.  Practice safer sex. Use condoms.  Some vaginal medications cause latex condoms to fail. Vaginal medications that harm condoms are:  Cleocin cream.  Butoconazole (Femstat).  Terconazole (Terazol) vaginal suppository.  Miconazole (Monistat) (may be purchased over the counter). SEEK MEDICAL CARE IF:     You have a temperature by mouth above 102 F (38.9 C).  The infection is getting worse after 2 days of treatment.  The infection is not getting better after 3 days of treatment.  You develop blisters in or around your  vagina.  You develop vaginal bleeding, and it is not your menstrual period.  You have pain when you urinate.  You develop intestinal problems.  You have pain with sexual intercourse. Document Released: 12/11/2004 Document Revised: 05/26/2011 Document Reviewed: 08/25/2008 ExitCare Patient Information 2015 ExitCare[employer]. This information is not intended to replace advice given to you by your health care provider. Make sure you discuss any questions you have with your health care provider.  

## 2014-11-30 NOTE — Progress Notes (Signed)
Patient ID: Abigail Higgins, female   DOB: [birth year], 34 y.o.   MRN: [id number] Presents with complaint of questionable blood in urine, urinary frequency, mild burning and urgency, slight vaginal discharge with itching. Denies abdominal pain or fever. Amenorrheic with Mirena IUD.  Exam: Appears well. External genitalia within normal limits, speculum exam scant discharge, no visible blood at urethra or in vaginal vault IUD strings visible in os. Wet prep positive for few yeast, few clue cells TNTC bacteria. Bimanual no CMT or adnexal tenderness. UA: 3+ blood, positive nitrites, 2+ leukocytes, WBCs packed, rbc's packed, many bacteria   UTI with hematuria BV and yeast  Plan: Septra BID for 3 days, uti prevention discussed. Urine culture pending. MetroGel vaginal cream 1 applicator at bedtime 5, alcohol precautions/in recovery. Diflucan 150 by mouth 1 dose. Instructed to call if no relief of symptoms.

## 2014-12-02 LAB — URINE CULTURE: Colony Count: >=100000

## 2015-02-14 ENCOUNTER — Ambulatory Visit (INDEPENDENT_AMBULATORY_CARE_PROVIDER_SITE_OTHER): Payer: PRIVATE HEALTH INSURANCE | Admitting: Women's Health

## 2015-02-14 ENCOUNTER — Encounter: Payer: Self-pay | Admitting: Women's Health

## 2015-02-14 DIAGNOSIS — R319 Hematuria, unspecified: Secondary | ICD-10-CM | POA: Diagnosis not present

## 2015-02-14 DIAGNOSIS — N898 Other specified noninflammatory disorders of vagina: Secondary | ICD-10-CM

## 2015-02-14 DIAGNOSIS — R350 Frequency of micturition: Secondary | ICD-10-CM | POA: Diagnosis not present

## 2015-02-14 DIAGNOSIS — A499 Bacterial infection, unspecified: Secondary | ICD-10-CM

## 2015-02-14 DIAGNOSIS — B9689 Other specified bacterial agents as the cause of diseases classified elsewhere: Secondary | ICD-10-CM

## 2015-02-14 DIAGNOSIS — N760 Acute vaginitis: Secondary | ICD-10-CM | POA: Diagnosis not present

## 2015-02-14 LAB — URINALYSIS W MICROSCOPIC + REFLEX CULTURE
Bilirubin Urine: NEGATIVE
CASTS: NONE SEEN [LPF]
CRYSTALS: NONE SEEN [HPF]
Glucose, UA: NEGATIVE
Nitrite: NEGATIVE
PH: 5 (ref 5.0–8.0)
PROTEIN: NEGATIVE
Specific Gravity, Urine: >1.03 (ref 1.001–1.035)
YEAST: NONE SEEN [HPF]

## 2015-02-14 LAB — WET PREP FOR TRICH, YEAST, CLUE
TRICH WET PREP: NONE SEEN
Yeast Wet Prep HPF POC: NONE SEEN

## 2015-02-14 MED ORDER — SULFAMETHOXAZOLE-TRIMETHOPRIM 800-160 MG PO TABS: 1.0000 | ORAL_TABLET | Freq: Two times a day (BID) | ORAL | Status: DC

## 2015-02-14 MED ORDER — METRONIDAZOLE 500 MG PO TABS: 500.0000 mg | ORAL_TABLET | Freq: Two times a day (BID) | ORAL | Status: DC

## 2015-02-14 NOTE — Patient Instructions (Signed)
Bacterial Vaginosis Bacterial vaginosis is a vaginal infection that occurs when the normal balance of bacteria in the vagina is disrupted. It results from an overgrowth of certain bacteria. This is the most common vaginal infection in women of childbearing age. Treatment is important to prevent complications, especially in pregnant women, as it can cause a premature delivery. CAUSES  Bacterial vaginosis is caused by an increase in harmful bacteria that are normally present in smaller amounts in the vagina. Several different kinds of bacteria can cause bacterial vaginosis. However, the reason that the condition develops is not fully understood. RISK FACTORS Certain activities or behaviors can put you at an increased risk of developing bacterial vaginosis, including:  Having a new sex partner or multiple sex partners.  Douching.  Using an intrauterine device (IUD) for contraception. Women do not get bacterial vaginosis from toilet seats, bedding, swimming pools, or contact with objects around them. SIGNS AND SYMPTOMS  Some women with bacterial vaginosis have no signs or symptoms. Common symptoms include:  Grey vaginal discharge.  A fishlike odor with discharge, especially after sexual intercourse.  Itching or burning of the vagina and vulva.  Burning or pain with urination. DIAGNOSIS  Your health care provider will take a medical history and examine the vagina for signs of bacterial vaginosis. A sample of vaginal fluid may be taken. Your health care provider will look at this sample under a microscope to check for bacteria and abnormal cells. A vaginal pH test may also be done.  TREATMENT  Bacterial vaginosis may be treated with antibiotic medicines. These may be given in the form of a pill or a vaginal cream. A second round of antibiotics may be prescribed if the condition comes back after treatment. Because bacterial vaginosis increases your risk for sexually transmitted diseases, getting  treated can help reduce your risk for chlamydia, gonorrhea, HIV, and herpes. HOME CARE INSTRUCTIONS   Only take over-the-counter or prescription medicines as directed by your health care provider.  If antibiotic medicine was prescribed, take it as directed. Make sure you finish it even if you start to feel better.  Tell all sexual partners that you have a vaginal infection. They should see their health care provider and be treated if they have problems, such as a mild rash or itching.  During treatment, it is important that you follow these instructions:  Avoid sexual activity or use condoms correctly.  Do not douche.  Avoid alcohol as directed by your health care provider.  Avoid breastfeeding as directed by your health care provider. SEEK MEDICAL CARE IF:   Your symptoms are not improving after 3 days of treatment.  You have increased discharge or pain.  You have a fever. MAKE SURE YOU:   Understand these instructions.  Will watch your condition.  Will get help right away if you are not doing well or get worse. FOR MORE INFORMATION  Centers for Disease Control and Prevention, Division of STD Prevention: [employer]Apps.[employer].za American Sexual Health Association (ASHA): www.ashastd.org    This information is not intended to replace advice given to you by your health care provider. Make sure you discuss any questions you have with your health care provider.   Document Released: 03/03/2005 Document Revised: 03/24/2014 Document Reviewed: 10/13/2012 Elsevier Interactive Patient Education 2016 Elsevier Inc. Hematuria, Adult Hematuria is blood in your urine. It can be caused by a bladder infection, kidney infection, prostate infection, kidney stone, or cancer of your urinary tract. Infections can usually be treated with medicine, and a  kidney stone usually will pass through your urine. If neither of these is the cause of your hematuria, further workup to find out the reason may be  needed. It is very important that you tell your health care provider about any blood you see in your urine, even if the blood stops without treatment or happens without causing pain. Blood in your urine that happens and then stops and then happens again can be a symptom of a very serious condition. Also, pain is not a symptom in the initial stages of many urinary cancers. HOME CARE INSTRUCTIONS   Drink lots of fluid, 3-4 quarts a day. If you have been diagnosed with an infection, cranberry juice is especially recommended, in addition to large amounts of water.  Avoid caffeine, tea, and carbonated beverages because they tend to irritate the bladder.  Avoid alcohol because it may irritate the prostate.  Take all medicines as directed by your health care provider.  If you were prescribed an antibiotic medicine, finish it all even if you start to feel better.  If you have been diagnosed with a kidney stone, follow your health care provider's instructions regarding straining your urine to catch the stone.  Empty your bladder often. Avoid holding urine for long periods of time.  After a bowel movement, women should cleanse front to back. Use each tissue only once.  Empty your bladder before and after sexual intercourse if you are a female. SEEK MEDICAL CARE IF:  You develop back pain.  You have a fever.  You have a feeling of sickness in your stomach (nausea) or vomiting.  Your symptoms are not better in 3 days. Return sooner if you are getting worse. SEEK IMMEDIATE MEDICAL CARE IF:   You develop severe vomiting and are unable to keep the medicine down.  You develop severe back or abdominal pain despite taking your medicines.  You begin passing a large amount of blood or clots in your urine.  You feel extremely weak or faint, or you pass out. MAKE SURE YOU:   Understand these instructions.  Will watch your condition.  Will get help right away if you are not doing well or get  worse.   This information is not intended to replace advice given to you by your health care provider. Make sure you discuss any questions you have with your health care provider.   Document Released: 03/03/2005 Document Revised: 03/24/2014 Document Reviewed: 11/01/2012 Elsevier Interactive Patient Education [employer] Elsevier Inc.

## 2015-02-14 NOTE — Progress Notes (Signed)
Patient ID: Abigail Higgins, female   DOB: [birth year], 34 y.o.   MRN: [id number] Presents with complaint of painless  blood in urine for several weeks. Amenorrheic with Mirena IUD. Denies back pain, pain or burning with urination. Scant discharge. Same partner. History of urethral stretching several years ago.  Exam: Appears well. No CVAT, abdomen soft nontender, UA: +3 blood, trace leukocytes, 10-20 WBCs, 40-60 RBCs, many bacteria. External genitalia within normal limits, speculum exam scant discharge wet prep positive for moderate clues, TNTC bacteria. Bimanual no CMT or adnexal tenderness.  Painless Hematuria Bacterial vaginosis  Plan: Flagyl 500 twice daily for 7 days, alcohol precautions reviewed. (Sober greater than 10 years). Septra twice daily for 3 days. Test of cure UA in 2 weeks instructed to schedule. If hematuria persists we'll refer to urologist. Urine culture pending.

## 2015-02-16 ENCOUNTER — Encounter: Payer: Self-pay | Admitting: Women's Health

## 2015-02-16 LAB — URINE CULTURE: Colony Count: 3000

## 2015-03-01 ENCOUNTER — Other Ambulatory Visit: Payer: PRIVATE HEALTH INSURANCE

## 2015-03-01 DIAGNOSIS — R319 Hematuria, unspecified: Secondary | ICD-10-CM

## 2015-03-02 ENCOUNTER — Encounter: Payer: Self-pay | Admitting: Women's Health

## 2015-03-02 LAB — URINALYSIS W MICROSCOPIC + REFLEX CULTURE
Bacteria, UA: NONE SEEN [HPF]
Bilirubin Urine: NEGATIVE
CASTS: NONE SEEN [LPF]
Crystals: NONE SEEN [HPF]
Glucose, UA: NEGATIVE
Hgb urine dipstick: NEGATIVE
Ketones, ur: NEGATIVE
Leukocytes, UA: NEGATIVE
NITRITE: NEGATIVE
PH: 7.5 (ref 5.0–8.0)
Protein, ur: NEGATIVE
RBC / HPF: NONE SEEN RBC/HPF (ref <=2)
SPECIFIC GRAVITY, URINE: 1.023 (ref 1.001–1.035)
YEAST: NONE SEEN [HPF]

## 2015-03-04 LAB — URINE CULTURE: Colony Count: 40000

## 2015-05-29 ENCOUNTER — Encounter: Payer: Self-pay | Admitting: Women's Health

## 2015-05-29 ENCOUNTER — Ambulatory Visit (INDEPENDENT_AMBULATORY_CARE_PROVIDER_SITE_OTHER): Payer: PRIVATE HEALTH INSURANCE | Admitting: Women's Health

## 2015-05-29 VITALS — BP 124/86

## 2015-05-29 DIAGNOSIS — R319 Hematuria, unspecified: Secondary | ICD-10-CM

## 2015-05-29 DIAGNOSIS — B373 Candidiasis of vulva and vagina: Secondary | ICD-10-CM | POA: Diagnosis not present

## 2015-05-29 DIAGNOSIS — N3000 Acute cystitis without hematuria: Secondary | ICD-10-CM | POA: Diagnosis not present

## 2015-05-29 DIAGNOSIS — B3731 Acute candidiasis of vulva and vagina: Secondary | ICD-10-CM

## 2015-05-29 LAB — URINALYSIS W MICROSCOPIC + REFLEX CULTURE
Bilirubin Urine: NEGATIVE
CRYSTALS: NONE SEEN [HPF]
Casts: NONE SEEN [LPF]
Glucose, UA: NEGATIVE
HGB URINE DIPSTICK: NEGATIVE
KETONES UR: NEGATIVE
Nitrite: NEGATIVE
PH: 5 (ref 5.0–8.0)
PROTEIN: NEGATIVE
Specific Gravity, Urine: 1.025 (ref 1.001–1.035)

## 2015-05-29 MED ORDER — SULFAMETHOXAZOLE-TRIMETHOPRIM 800-160 MG PO TABS: 1.0000 | ORAL_TABLET | Freq: Two times a day (BID) | ORAL | Status: DC

## 2015-05-29 MED ORDER — FLUCONAZOLE 100 MG PO TABS: ORAL_TABLET | ORAL | Status: DC

## 2015-05-29 MED ORDER — NITROFURANTOIN MACROCRYSTAL 50 MG PO CAPS: 50.0000 mg | ORAL_CAPSULE | Freq: Four times a day (QID) | ORAL | Status: DC

## 2015-05-29 MED ORDER — NITROFURANTOIN MACROCRYSTAL 50 MG PO CAPS: ORAL_CAPSULE | ORAL | Status: DC

## 2015-05-29 NOTE — Patient Instructions (Signed)

## 2015-05-29 NOTE — Progress Notes (Signed)
Presents with 5 days of dysuria and  Irritation with wiping. Denies discharge/ itching/ odor, fever, hematuria, or abdominal/low back pain. Questions if recurrent UTIs related to intercourse and states has frequent vaginal itching.  Amenorrheic/ Mirena IUD 2014/ same partner. Treated for UTI and BV in 01/2015 with Septra and Flagyl after complaint of painless hematuria.  Exam: Appears well,. Urinalysis: 1+ leuk esterase, 20-40 WBCs, many bacteria, few yeast, negative blood/RBCs.  Urinary tract infection Yeast vaginitis Amenorrhea/ Mirena IUD 2014  Plan: Rx for Bactrim DS BID x3 days given with instructions for use. Rx for Macrodantin 50mg  given, instructed to use after intercourse. Rx for Diflucan 100mg  given, patient instructed to take x2 tablets today and to take x1 tablet in the future if she develops itching or yeast infection symptoms. Patient advised to call if symptoms do not improve, has annual appointment next week and will recheck UA.Marland Kitchen. UTI and yeast prevention discussed.

## 2015-05-29 NOTE — Addendum Note (Signed)
Addended by: Berna SpareASTILLO, Haneefah Venturini A on: 05/29/2015 04:41 PM   Modules accepted: Orders

## 2015-06-01 ENCOUNTER — Other Ambulatory Visit: Payer: Self-pay | Admitting: Women's Health

## 2015-06-01 DIAGNOSIS — N3000 Acute cystitis without hematuria: Secondary | ICD-10-CM

## 2015-06-01 LAB — URINE CULTURE: Colony Count: >=100000

## 2015-06-01 MED ORDER — AMPICILLIN 500 MG PO CAPS: 500.0000 mg | ORAL_CAPSULE | Freq: Four times a day (QID) | ORAL | Status: DC

## 2015-06-01 NOTE — Progress Notes (Signed)
Patient called back to let me know she received voice mail and understood to d/c Bactrim and pick up Ampicillin at pharmacy and start that.

## 2015-06-05 ENCOUNTER — Ambulatory Visit (INDEPENDENT_AMBULATORY_CARE_PROVIDER_SITE_OTHER): Payer: PRIVATE HEALTH INSURANCE | Admitting: Women's Health

## 2015-06-05 ENCOUNTER — Encounter: Payer: Self-pay | Admitting: Women's Health

## 2015-06-05 VITALS — BP 126/86 | Ht 65.0 in | Wt 185.3 lb

## 2015-06-05 DIAGNOSIS — N3000 Acute cystitis without hematuria: Secondary | ICD-10-CM

## 2015-06-05 DIAGNOSIS — Z01419 Encounter for gynecological examination (general) (routine) without abnormal findings: Secondary | ICD-10-CM | POA: Diagnosis not present

## 2015-06-05 DIAGNOSIS — Z131 Encounter for screening for diabetes mellitus: Secondary | ICD-10-CM

## 2015-06-05 LAB — URINALYSIS W MICROSCOPIC + REFLEX CULTURE
Bilirubin Urine: NEGATIVE
CASTS: NONE SEEN [LPF]
Crystals: NONE SEEN [HPF]
GLUCOSE, UA: NEGATIVE
KETONES UR: NEGATIVE
Leukocytes, UA: NEGATIVE
NITRITE: NEGATIVE
PH: 5 (ref 5.0–8.0)
Protein, ur: NEGATIVE
Specific Gravity, Urine: >1.03 (ref 1.001–1.035)
Yeast: NONE SEEN [HPF]

## 2015-06-05 NOTE — Patient Instructions (Addendum)
[facility]  [phone] Basic Carbohydrate Counting for Diabetes Mellitus Carbohydrate counting is a method for keeping track of the amount of carbohydrates you eat. Eating carbohydrates naturally increases the level of sugar (glucose) in your blood, so it is important for you to know the amount that is okay for you to have in every meal. Carbohydrate counting helps keep the level of glucose in your blood within normal limits. The amount of carbohydrates allowed is different for every person. A dietitian can help you calculate the amount that is right for you. Once you know the amount of carbohydrates you can have, you can count the carbohydrates in the foods you want to eat. Carbohydrates are found in the following foods:  Grains, such as breads and cereals.  Dried beans and soy products.  Starchy vegetables, such as potatoes, peas, and corn.  Fruit and fruit juices.  Milk and yogurt.  Sweets and snack foods, such as cake, cookies, candy, chips, soft drinks, and fruit drinks. CARBOHYDRATE COUNTING There are two ways to count the carbohydrates in your food. You can use either of the methods or a combination of both. Reading the "Nutrition Facts" on Packaged Food The "Nutrition Facts" is an area that is included on the labels of almost all packaged food and beverages in the [place]. It includes the serving size of that food or beverage and information about the nutrients in each serving of the food, including the grams (g) of carbohydrate per serving.  Decide the number of servings of this food or beverage that you will be able to eat or drink. Multiply that number of servings by the number of grams of carbohydrate that is listed on the label for that serving. The total will be the amount of carbohydrates you will be having when you eat or drink this food or beverage. Learning Standard Serving Sizes of Food When you eat food that is not packaged or does not include "Nutrition Facts" on the  label, you need to measure the servings in order to count the amount of carbohydrates.A serving of most carbohydrate-rich foods contains about 15 g of carbohydrates. The following list includes serving sizes of carbohydrate-rich foods that provide 15 g ofcarbohydrate per serving:   1 slice of bread (1 oz) or 1 six-inch tortilla.    of a hamburger bun or English muffin.  4-6 crackers.   cup unsweetened dry cereal.    cup hot cereal.   cup rice or pasta.    cup mashed potatoes or  of a large baked potato.  1 cup fresh fruit or one small piece of fruit.    cup canned or frozen fruit or fruit juice.  1 cup milk.   cup plain fat-free yogurt or yogurt sweetened with artificial sweeteners.   cup cooked dried beans or starchy vegetable, such as peas, corn, or potatoes.  Decide the number of standard-size servings that you will eat. Multiply that number of servings by 15 (the grams of carbohydrates in that serving). For example, if you eat 2 cups of strawberries, you will have eaten 2 servings and 30 g of carbohydrates (2 servings x 15 g = 30 g). For foods such as soups and casseroles, in which more than one food is mixed in, you will need to count the carbohydrates in each food that is included. EXAMPLE OF CARBOHYDRATE COUNTING Sample Dinner  3 oz chicken breast.   cup of brown rice.   cup of corn.  1 cup milk.  1 cup strawberries with sugar-free whipped topping.  Carbohydrate Calculation Step 1: Identify the foods that contain carbohydrates:   Rice.   Corn.   Milk.   Strawberries. Step 2:Calculate the number of servings eaten of each:   2 servings of rice.   1 serving of corn.   1 serving of milk.   1 serving of strawberries. Step 3: Multiply each of those number of servings by 15 g:   2 servings of rice x 15 g = 30 g.   1 serving of corn x 15 g = 15 g.   1 serving of milk x 15 g = 15 g.   1 serving of strawberries x 15 g = 15  g. Step 4: Add together all of the amounts to find the total grams of carbohydrates eaten: 30 g + 15 g + 15 g + 15 g = 75 g.   This information is not intended to replace advice given to you by your health care provider. Make sure you discuss any questions you have with your health care provider.   Document Released: 03/03/2005 Document Revised: 03/24/2014 Document Reviewed: 01/28/2013 Elsevier Interactive Patient Education 2016 [employer].  Constipation, Adult Constipation is when a person has fewer than three bowel movements a week, has difficulty having a bowel movement, or has stools that are dry, hard, or larger than normal. As people grow older, constipation is more common. A low-fiber diet, not taking in enough fluids, and taking certain medicines may make constipation worse.  CAUSES   Certain medicines, such as antidepressants, pain medicine, iron supplements, antacids, and water pills.   Certain diseases, such as diabetes, irritable bowel syndrome (IBS), thyroid disease, or depression.   Not drinking enough water.   Not eating enough fiber-rich foods.   Stress or travel.   Lack of physical activity or exercise.   Ignoring the urge to have a bowel movement.   Using laxatives too much.  SIGNS AND SYMPTOMS   Having fewer than three bowel movements a week.   Straining to have a bowel movement.   Having stools that are hard, dry, or larger than normal.   Feeling full or bloated.   Pain in the lower abdomen.   Not feeling relief after having a bowel movement.  DIAGNOSIS  Your health care provider will take a medical history and perform a physical exam. Further testing may be done for severe constipation. Some tests may include:  A barium enema X-ray to examine your rectum, colon, and, sometimes, your small intestine.   A sigmoidoscopy to examine your lower colon.   A colonoscopy to examine your entire colon. TREATMENT  Treatment will depend on  the severity of your constipation and what is causing it. Some dietary treatments include drinking more fluids and eating more fiber-rich foods. Lifestyle treatments may include regular exercise. If these diet and lifestyle recommendations do not help, your health care provider may recommend taking over-the-counter laxative medicines to help you have bowel movements. Prescription medicines may be prescribed if over-the-counter medicines do not work.  HOME CARE INSTRUCTIONS   Eat foods that have a lot of fiber, such as fruits, vegetables, whole grains, and beans.  Limit foods high in fat and processed sugars, such as french fries, hamburgers, cookies, candies, and soda.   A fiber supplement may be added to your diet if you cannot get enough fiber from foods.   Drink enough fluids to keep your urine clear or pale yellow.   Exercise regularly  or as directed by your health care provider.   Go to the restroom when you have the urge to go. Do not hold it.   Only take over-the-counter or prescription medicines as directed by your health care provider. Do not take other medicines for constipation without talking to your health care provider first.  SEEK IMMEDIATE MEDICAL CARE IF:   You have bright red blood in your stool.   Your constipation lasts for more than 4 days or gets worse.   You have abdominal or rectal pain.   You have thin, pencil-like stools.   You have unexplained weight loss. MAKE SURE YOU:   Understand these instructions.  Will watch your condition.  Will get help right away if you are not doing well or get worse.   This information is not intended to replace advice given to you by your health care provider. Make sure you discuss any questions you have with your health care provider.   Document Released: 11/30/2003 Document Revised: 03/24/2014 Document Reviewed: 12/13/2012 Elsevier Interactive Patient Education [employer]

## 2015-06-05 NOTE — Progress Notes (Signed)
Abigail Higgins 1980/05/31 [id number]    History:    Presents for annual exam. Mirena IUD placed 02/2012- occasional spotting/ same partner. Treated for UTI and yeast vaginitis with Ampicillin 500mg  4x/day for 5 days and diflucan 100mg  x2 tablets on 05/29/2015 with resolution of dysuria and irritation with wiping. 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 Also reports 5 bowel movements a month, which is a chronic problem for her since high school. Has had no relief with increasing fiber, fluids, stool softeners, uses laxatives once weekly.  Past medical history, past surgical history, family history and social history were all reviewed and documented in the EPIC chart. Adopted, no history known about biological family. History of alcoholism, alcohol free for years. Works in housekeeping at [employer].  ROS:  A ROS was performed and pertinent positives and negatives are included.  Exam:  Filed Vitals:   06/05/15 1552  BP: 126/86    General appearance:  Normal Thyroid:  Symmetrical, normal in size, without palpable masses or nodularity. Respiratory  Auscultation:  Clear without wheezing or rhonchi Cardiovascular  Auscultation:  Regular rate, without rubs, murmurs or gallops  Edema/varicosities:  Not grossly evident Abdominal  Soft,nontender, without masses, guarding or rebound.  Liver/spleen:  No organomegaly noted  Hernia:  None appreciated  Skin  Inspection:  Grossly normal   Breasts: Examined lying and sitting.     Right: Without masses, retractions, discharge or axillary adenopathy.     Left: Without masses, retractions, discharge or axillary adenopathy. Gentitourinary   Inguinal/mons:  Normal without inguinal adenopathy  External genitalia:  Normal  BUS/Urethra/Skene's glands:  Normal  Vagina:  Normal  Cervix:  Normal, scant amount of dark menses-type blood, IUD strings visible at os  Uterus:  Normal in size, shape and contour.  Midline and mobile  Adnexa/parametria:      Rt: Without masses or tenderness.   Lt: Without masses or tenderness.  Anus and perineum: Normal  Digital rectal exam: Normal sphincter tone without palpated masses or tenderness  Assessment/Plan:  35 y.o. MWF G0 for annual exam without complaints.  TOC UA: 3+ blood, 40-60 RBCs, 40-60 squamous epis, 0-3 WBCs, many bacteria/culture pending  Mirena IUD placed 02/2012 -occasional spotting/ same partner Chronic Constipation Anxiety/Depression- labs and meds managed by psychiatry History of recurrent UTIs associated with intercourse Recurrent yeast IC  Plan: SBE's, start annual screening mammogram at 38, increase exercise and decrease calories for weight loss, MVI daily encouraged. Reminded to take macrodantin 50mg  after intercourse for UTI prevention and 100mg  Diflucan with symptoms of yeast. Normal pap with negative HR HPV 2016, new screening guidelines reviewed. Recommended increased fiber intake and use of laxatives PRN for constipation, follow-up with GI if symptoms are not relieved. [facility] information given instructed to schedule appointment. Hemoglobin A1c, (hemoglobin A1c 5.7   07/2014).   Harrington ChallengerYOUNG,Merel Santoli J [facility] CenterWHNP, 4:48 PM 06/05/2015

## 2015-06-06 ENCOUNTER — Encounter: Payer: Self-pay | Admitting: Women's Health

## 2015-06-06 LAB — HEMOGLOBIN A1C
Hgb A1c MFr Bld: 5.5 % (ref <5.7)
Mean Plasma Glucose: 111 mg/dL (ref <117)

## 2015-06-06 LAB — URINE CULTURE
Colony Count: NO GROWTH
ORGANISM ID, BACTERIA: NO GROWTH

## 2015-08-14 ENCOUNTER — Ambulatory Visit: Payer: PRIVATE HEALTH INSURANCE

## 2015-09-22 ENCOUNTER — Other Ambulatory Visit: Payer: Self-pay | Admitting: Physician Assistant

## 2015-09-24 ENCOUNTER — Other Ambulatory Visit: Payer: Self-pay | Admitting: Women's Health

## 2015-10-04 ENCOUNTER — Ambulatory Visit (INDEPENDENT_AMBULATORY_CARE_PROVIDER_SITE_OTHER): Payer: PRIVATE HEALTH INSURANCE | Admitting: Women's Health

## 2015-10-04 ENCOUNTER — Encounter: Payer: Self-pay | Admitting: Women's Health

## 2015-10-04 VITALS — BP 128/78 | Ht 65.0 in | Wt 189.0 lb

## 2015-10-04 DIAGNOSIS — B3731 Acute candidiasis of vulva and vagina: Secondary | ICD-10-CM

## 2015-10-04 DIAGNOSIS — B373 Candidiasis of vulva and vagina: Secondary | ICD-10-CM | POA: Diagnosis not present

## 2015-10-04 DIAGNOSIS — N3000 Acute cystitis without hematuria: Secondary | ICD-10-CM

## 2015-10-04 DIAGNOSIS — R300 Dysuria: Secondary | ICD-10-CM

## 2015-10-04 LAB — URINALYSIS W MICROSCOPIC + REFLEX CULTURE
BILIRUBIN URINE: NEGATIVE
CASTS: NONE SEEN [LPF]
CRYSTALS: NONE SEEN [HPF]
Glucose, UA: NEGATIVE
Hgb urine dipstick: NEGATIVE
KETONES UR: NEGATIVE
Nitrite: NEGATIVE
PROTEIN: NEGATIVE
Specific Gravity, Urine: 1.01 (ref 1.001–1.035)
pH: 5 (ref 5.0–8.0)

## 2015-10-04 LAB — WET PREP FOR TRICH, YEAST, CLUE
CLUE CELLS WET PREP: NONE SEEN
TRICH WET PREP: NONE SEEN

## 2015-10-04 MED ORDER — SULFAMETHOXAZOLE-TRIMETHOPRIM 800-160 MG PO TABS: 1.0000 | ORAL_TABLET | Freq: Two times a day (BID) | ORAL | Status: DC

## 2015-10-04 MED ORDER — FLUCONAZOLE 100 MG PO TABS: ORAL_TABLET | ORAL | Status: DC

## 2015-10-04 NOTE — Progress Notes (Signed)
Patient ID: Abigail Higgins, female   DOB: 1980-11-04, 35 y.o.   MRN: [id number] Presents with complaint of increased urinary frequency, urgency, burning, reports questionable blood in urine and increased vaginal itching for the past week. Has had problems with recurrent UTIs and yeast vaginitis. Amenorrheic on Mirena IUD placed 02/2012. Denies abdominal pain or fever. Using Macrobid 50 mg after intercourse, last UTI  11/2014.  Exam: Appears well. External genitalia mild erythema at introitus, speculum exam moderate amount of a white discharge noted, wet prep positive for many yeast, IUD strings visible in os. Bimanual no CMT or adnexal tenderness. UA: + 1 leukocytes, 10-20 WBCs, 0-2 RBCs, many bacteria and moderate yeast.  UTI Yeast vaginitis  Plan: Diflucan 100 mg 2 tablets today repeat in 3 days. Instructed to call if no relief of symptoms. Aware of yeast prevention. Septra twice daily for 3 days prescription, proper use given and reviewed will continue Macrodantin after intercourse.

## 2015-10-04 NOTE — Patient Instructions (Signed)

## 2015-10-06 LAB — URINE CULTURE: ORGANISM ID, BACTERIA: NO GROWTH

## 2015-10-08 ENCOUNTER — Encounter: Payer: Self-pay | Admitting: Women's Health

## 2015-10-09 ENCOUNTER — Ambulatory Visit: Payer: PRIVATE HEALTH INSURANCE | Admitting: Family Medicine

## 2016-01-24 ENCOUNTER — Ambulatory Visit (INDEPENDENT_AMBULATORY_CARE_PROVIDER_SITE_OTHER): Payer: PRIVATE HEALTH INSURANCE | Admitting: Physician Assistant

## 2016-01-24 DIAGNOSIS — G43909 Migraine, unspecified, not intractable, without status migrainosus: Secondary | ICD-10-CM | POA: Insufficient documentation

## 2016-01-24 DIAGNOSIS — G43001 Migraine without aura, not intractable, with status migrainosus: Secondary | ICD-10-CM

## 2016-01-24 MED ORDER — PROPRANOLOL HCL ER 60 MG PO CP24: 60.0000 mg | ORAL_CAPSULE | Freq: Every day | ORAL | 1 refills | Status: DC

## 2016-01-24 MED ORDER — KETOROLAC TROMETHAMINE 30 MG/ML IJ SOLN
30.0000 mg | Freq: Once | INTRAMUSCULAR | Status: AC
  Administered 2016-01-24: 30 mg via INTRAMUSCULAR

## 2016-01-24 MED ORDER — PROMETHAZINE HCL 25 MG/ML IJ SOLN
25.0000 mg | Freq: Once | INTRAMUSCULAR | Status: AC
  Administered 2016-01-24: 25 mg via INTRAMUSCULAR

## 2016-01-24 NOTE — Progress Notes (Signed)
Patient ID: Abigail Higgins, female    DOB: [birth year], 35 y.o.   MRN: [id number]  PCP: No PCP Per Patient  Chief Complaint  Patient presents with  . Migraine    2 in last 2 weeks continuous HA onset 1 week/ [facility]    Subjective:    HPI Presents for evaluation of migraine.She is accompanied by her husband.  She has a long history of migraine, usually resolve with OTC NSAID and going to sleep.  This headache began a week ago, and is the second headache in 2 weeks, which is unusual for her, though otherwise, this is her typical headache.  Her father, an anesthesiologist, gave her some propranolol samples, she thinks 20 mg, with instructions to take on 2-3 times a day, depending on her headache. She's had 2 doses so far, and it hasn't helped. However, she rates her pain as a 5-6/10 following propranolol dose, 10/10 prior.  [facility], but the first available new patient appointment is 12/20, which she went ahead and took, but can't wait until then.  Has never used a triptan.  Her PCP has retired, and she is looking for a new person to establish. Her psychiatrist has left clinical practice.  Describes a stabbing pain in the back of the head, with associated eye pain, photophobia, nausea and lethargy. No aura. Not the "worst headache of her life." No weakness, paresthesias, slurred speech or change in mentation. No blurry or double vision.   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  She has noted some increasing sinus pain and pressure recently but states she takes Sudafed daily for this.   History of constipation which she states is controlled with a Miralax regimen.   History of bipolar, anxiety, and depression but states it is currently controlled on Seroquel, Xanax, Abilify, and Klonopin. History of ADD for which she takes Vyvanse. She states she is currently in between psychiatrists because  her prior psychiatrist stopped practicing to do research but has a referral from her for a new one.   Notes some increased vaginal discharge with mild odor recently but denies vaginal irritation or itching. Denies urinary complaints or abdominal/pelvic pain. States she sees Abigail RowanNancy Higgins for GYN and will follow-up with her if it persists. Denies interest in addressing this today.    Review of Systems Constitutional: Positive for fatigue. Negative for appetite change, chills, diaphoresis, fever and unexpected weight change.  HENT: Positive for ear pain, sinus pain and sinus pressure. Negative for congestion, ear discharge, hearing loss, postnasal drip, rhinorrhea, sore throat and tinnitus.   Eyes: Positive for photophobia and pain. Negative for discharge, redness, itching and visual disturbance.  Respiratory: Negative for cough, chest tightness, shortness of breath and wheezing.   Cardiovascular: Negative for chest pain and palpitations.  Gastrointestinal: Positive for constipation and nausea. Negative for abdominal pain, blood in stool, diarrhea and vomiting.  Genitourinary: Positive for vaginal discharge. Negative for difficulty urinating, dysuria, frequency, hematuria and urgency.  Musculoskeletal: Negative for back pain, gait problem, myalgias, neck pain and neck stiffness.  Neurological: Positive for dizziness and headaches. Negative for tremors, syncope, facial asymmetry, speech difficulty, light-headedness and numbness.  Psychiatric/Behavioral: Positive for dysphoric mood.     Patient Active Problem List   Diagnosis Date Noted  . History of alcoholism (HCC) 11/30/2014  . Eustachian tube dysfunction 09/29/2014  . Hyperlipidemia 06/26/2014  . Cigarette smoker one half pack a  day or less 06/01/2014  . Rash 03/14/2013  . Other malaise and fatigue 03/14/2013  . IC (interstitial cystitis) 12/15/2012  . Adopted 05/18/2012  . Weight gain 05/18/2012  . Bipolar I disorder (HCC)   . CIN I  (cervical intraepithelial neoplasia I)      Prior to Admission medications   Medication Sig Start Date End Date Taking? Authorizing Provider  ALPRAZolam (XANAX) 0.25 MG tablet Take 0.25 mg by mouth 4 (four) times daily as needed.    Yes Historical Provider, MD  ARIPiprazole (ABILIFY) 10 MG tablet Take 10 mg by mouth daily.   Yes Historical Provider, MD  clobetasol cream (TEMOVATE) 0.05 % Apply 1 application topically 2 (two) times daily as needed.   Yes Historical Provider, MD  clonazePAM (KLONOPIN) 1 MG tablet Take 1 tablet by mouth daily. 09/26/15  Yes Historical Provider, MD  glycopyrrolate (ROBINUL) 1 MG tablet Take 1 tablet by mouth 2 (two) times daily. 09/20/15  Yes Historical Provider, MD  levonorgestrel (MIRENA) 20 MCG/24HR IUD 1 each by Intrauterine route once. Reported on 05/29/2015   Yes Historical Provider, MD  lisdexamfetamine (VYVANSE) 50 MG capsule Take 50 mg by mouth every morning.   Yes Historical Provider, MD  oxybutynin (DITROPAN-XL) 10 MG 24 hr tablet Take 1 tablet by mouth daily. Reported on 05/29/2015 11/10/14  Yes Historical Provider, MD  QUEtiapine (SEROQUEL XR) 50 MG TB24 24 hr tablet Take 2 tablets by mouth at bedtime. 09/11/15  Yes Historical Provider, MD     No Known Allergies     Objective:  Physical Exam  Constitutional: She is oriented to person, place, and time. She appears well-developed and well-nourished. She is active and cooperative. No distress.  BP 110/86 (BP Location: Right Arm, Patient Position: Sitting, Cuff Size: Normal)   Pulse 75   Temp 98.3 F (36.8 C) (Oral)   Resp 17   Ht 5\' 5"  (1.651 m)   Wt 192 lb (87.1 kg)   SpO2 98%   BMI 31.95 kg/m   HENT:  Head: Normocephalic and atraumatic.  Right Ear: Hearing, tympanic membrane, external ear and ear canal normal.  Left Ear: Hearing, tympanic membrane, external ear and ear canal normal.  Nose: Right sinus exhibits maxillary sinus tenderness and frontal sinus tenderness. Left sinus exhibits  maxillary sinus tenderness and frontal sinus tenderness.  Mouth/Throat: Uvula is midline, oropharynx is clear and moist and mucous membranes are normal.  Eyes: Conjunctivae are normal. No scleral icterus.  Neck: Normal range of motion, full passive range of motion without pain and phonation normal. Neck supple. No thyromegaly present.  Cardiovascular: Normal rate, regular rhythm and normal heart sounds.   Pulses:      Radial pulses are 2+ on the right side, and 2+ on the left side.  Pulmonary/Chest: Effort normal and breath sounds normal.  Lymphadenopathy:       Head (right side): No tonsillar, no preauricular, no posterior auricular and no occipital adenopathy present.       Head (left side): No tonsillar, no preauricular, no posterior auricular and no occipital adenopathy present.    She has no cervical adenopathy.       Right: No supraclavicular adenopathy present.       Left: No supraclavicular adenopathy present.  Neurological: She is alert and oriented to person, place, and time. She has normal strength. No cranial nerve deficit or sensory deficit. She displays a negative Romberg sign.  Reflex Scores:      Bicep reflexes are 2+ on the  right side and 2+ on the left side.      Patellar reflexes are 2+ on the right side and 2+ on the left side. Skin: Skin is warm, dry and intact. No rash noted. No cyanosis or erythema. Nails show no clubbing.  Psychiatric: She has a normal mood and affect. Her speech is normal and behavior is normal.           Assessment & Plan:   1. Migraine without aura and with status migrainosus, not intractable Change from IR propranolol to LA. Proceed with ha specialist visit next month. TO ED if symptoms worsen/persist. - ketorolac (TORADOL) 30 MG/ML injection 30 mg; Inject 1 mL (30 mg total) into the muscle once. - promethazine (PHENERGAN) injection 25 mg; Inject 1 mL (25 mg total) into the muscle once. - propranolol ER (INDERAL LA) 60 MG 24 hr capsule;  Take 1 capsule (60 mg total) by mouth at bedtime.  Dispense: 30 capsule; Refill: 1   Fernande Bras, PA-C Physician Assistant-Certified [facility]

## 2016-01-24 NOTE — Progress Notes (Signed)
01/10/16

## 2016-01-24 NOTE — Patient Instructions (Signed)
     IF you received an x-ray today, you will receive an invoice from [facility]. Please contact [facility] at [phone] with questions or concerns regarding your invoice.   IF you received labwork today, you will receive an invoice from [facility]. Please contact [facility] at [phone] with questions or concerns regarding your invoice.   Our billing staff will not be able to assist you with questions regarding bills from these companies.  You will be contacted with the lab results as soon as they are available. The fastest way to get your results is to activate your My Chart account. Instructions are located on the last page of this paperwork. If you have not heard from us regarding the results in 2 weeks, please contact this office.      

## 2016-01-24 NOTE — Progress Notes (Signed)
Subjective:   Chief Complaint  Patient presents with  . Migraine    2 in last 2 weeks continuous HA onset 1 week/ Appt at [facility] in December   Patient Care Team: No Pcp Per Patient as PCP - General (General Practice) Harrington Challenger, NP as Nurse Practitioner (Obstetrics and Gynecology)    Patient ID: Abigail Higgins, female    DOB: 1980/04/13, 35 y.o.   MRN: [id number]  HPI: Presents for headache/migraine which has persisted for the past 5 days. Location noted as entire head, particularly bad in the frontal area and feels like a "stabbing pain" in the back of her head. Notes associated eye pain, photophobia, mild nausea, and lethargy. Denies aura, feeling like "worst headache of her life", eye drainage, vomiting. Denies numbness/tingling in extremities, weakness, loss of motor control, seizure activity, facial asymmetry, or speech difficulty. States she has been taking Aleve at home (2 tablets 2x/day for the past 5 days) and also tried Excedrin migraine which have not provided relief. She was given a Propranolol sample from her father (whom is an anesthesiologist) yesterday and took one dose at 6pm last night and another dose at 6 am this morning, notes this has provided some but not complete relief. Rates the pain currently as 5-6/10 and prior to taking the Propranolol, it was a 10/10. She called the headache clinic but was not able to schedule an appointment with them until Dec. 20th so wanted to come here first. 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  She was seen by her eye doctor earlier this week because originally she thought the headache may have been due to her recent change in contact lenses. States her eye exam and vision check was completely normal. She denies vision changes, blurry vision. Endorses some "eye twitching" which she sometimes gets with her headaches.  She has a history of headaches, stating she  usually gets about 6/year. Her last headache was about 2 weeks ago and she states it lasted about 6-12 hours and went away after Aleve and sleep. she states they are usually relieved by Aleve or going into a dark room and going to sleep. States this is the first time she has continued to go to sleep and wake up with the headache, even waking up during the night with it. Notes getting an injection for a migraine one time prior to this but unsure when or what the injection was.  She has noted some increasing sinus pain and pressure recently but states she takes Sudafed daily for this.   History of constipation which she states is controlled with a Miralax regimen.   History of bipolar, anxiety, and depression but states it is currently controlled on Seroquel, Xanax, Abilify, and Klonopin. History of ADD for which she takes Vyvanse. She states she is currently in between psychiatrists because her prior psychiatrist stopped practicing to do research but has a referral from her for a new one.   Notes some increased vaginal discharge with mild odor recently but denies vaginal irritation or itching. Denies urinary complaints or abdominal/pelvic pain. States she sees Maryelizabeth Rowan for GYN and will follow-up with her if it persists. Denies interest in addressing this today.  Review of Systems  Constitutional: Positive for fatigue. Negative for appetite change, chills, diaphoresis, fever and unexpected weight change.  HENT: Positive for ear pain, sinus pain and sinus pressure. Negative for congestion, ear  discharge, hearing loss, postnasal drip, rhinorrhea, sore throat and tinnitus.   Eyes: Positive for photophobia and pain. Negative for discharge, redness, itching and visual disturbance.  Respiratory: Negative for cough, chest tightness, shortness of breath and wheezing.   Cardiovascular: Negative for chest pain and palpitations.  Gastrointestinal: Positive for constipation and nausea. Negative for abdominal  pain, blood in stool, diarrhea and vomiting.  Genitourinary: Positive for vaginal discharge. Negative for difficulty urinating, dysuria, frequency, hematuria and urgency.  Musculoskeletal: Negative for back pain, gait problem, myalgias, neck pain and neck stiffness.  Neurological: Positive for dizziness and headaches. Negative for tremors, syncope, facial asymmetry, speech difficulty, light-headedness and numbness.  Psychiatric/Behavioral: Positive for dysphoric mood.   No Known Allergies  Prior to Admission medications   Medication Sig Start Date End Date Taking? Authorizing Provider  ALPRAZolam (XANAX) 0.25 MG tablet Take 0.25 mg by mouth 4 (four) times daily as needed.    Yes Historical Provider, MD  ARIPiprazole (ABILIFY) 10 MG tablet Take 10 mg by mouth daily.   Yes Historical Provider, MD  clobetasol cream (TEMOVATE) 0.05 % Apply 1 application topically 2 (two) times daily as needed.   Yes Historical Provider, MD  clonazePAM (KLONOPIN) 1 MG tablet Take 1 tablet by mouth daily. 09/26/15  Yes Historical Provider, MD  glycopyrrolate (ROBINUL) 1 MG tablet Take 1 tablet by mouth 2 (two) times daily. 09/20/15  Yes Historical Provider, MD  levonorgestrel (MIRENA) 20 MCG/24HR IUD 1 each by Intrauterine route once. Reported on 05/29/2015   Yes Historical Provider, MD  lisdexamfetamine (VYVANSE) 50 MG capsule Take 50 mg by mouth every morning.   Yes Historical Provider, MD  oxybutynin (DITROPAN-XL) 10 MG 24 hr tablet Take 1 tablet by mouth daily. Reported on 05/29/2015 11/10/14  Yes Historical Provider, MD  QUEtiapine (SEROQUEL XR) 50 MG TB24 24 hr tablet Take 2 tablets by mouth at bedtime. 09/11/15  Yes Historical Provider, MD  propranolol ER (INDERAL LA) 60 MG 24 hr capsule Take 1 capsule (60 mg total) by mouth at bedtime. 01/24/16   Porfirio Oar, PA-C   Patient Active Problem List   Diagnosis Date Noted  . Migraine 01/24/2016  . History of alcoholism (HCC) 11/30/2014  . Eustachian tube dysfunction  09/29/2014  . Hyperlipidemia 06/26/2014  . Cigarette smoker one half pack a day or less 06/01/2014  . Rash 03/14/2013  . Other malaise and fatigue 03/14/2013  . IC (interstitial cystitis) 12/15/2012  . Adopted 05/18/2012  . Weight gain 05/18/2012  . Bipolar I disorder (HCC)   . CIN I (cervical intraepithelial neoplasia I)        Objective:   Physical Exam  Constitutional: She is oriented to person, place, and time. She appears well-developed and well-nourished. She is cooperative. No distress.  HENT:  Head: Normocephalic and atraumatic. Head is without raccoon's eyes, without Battle's sign, without abrasion, without contusion, without laceration, without right periorbital erythema and without left periorbital erythema.  Right Ear: No drainage, swelling or tenderness. Tympanic membrane is not injected, not scarred, not perforated, not erythematous, not retracted and not bulging. No middle ear effusion.  Left Ear: External ear normal. No drainage, swelling or tenderness. Tympanic membrane is not injected, not scarred, not perforated, not erythematous, not retracted and not bulging.  No middle ear effusion.  Nose: No mucosal edema, rhinorrhea, nose lacerations, sinus tenderness, septal deviation or nasal septal hematoma. No epistaxis.  No foreign bodies. Right sinus exhibits frontal sinus tenderness. Right sinus exhibits no maxillary sinus tenderness. Left sinus exhibits  frontal sinus tenderness. Left sinus exhibits no maxillary sinus tenderness.  Mouth/Throat: Uvula is midline, oropharynx is clear and moist and mucous membranes are normal. Mucous membranes are not pale, not dry and not cyanotic. No oral lesions. No trismus in the jaw. Normal dentition. No dental abscesses or lacerations. No oropharyngeal exudate, posterior oropharyngeal edema, posterior oropharyngeal erythema or tonsillar abscesses.  History of rhinoplasty, right nares with smaller opening than left nares.  Eyes: Conjunctivae  and EOM are normal. Pupils are equal, round, and reactive to light. Right eye exhibits no discharge and no exudate. Left eye exhibits no discharge and no exudate. Right conjunctiva is not injected. Right conjunctiva has no hemorrhage. Left conjunctiva is not injected. Left conjunctiva has no hemorrhage. No scleral icterus. Right eye exhibits normal extraocular motion and no nystagmus. Left eye exhibits normal extraocular motion and no nystagmus. Right pupil is round and reactive. Left pupil is round and reactive. Pupils are equal.  Fundoscopic exam:      The right eye shows no arteriolar narrowing, no AV nicking, no exudate, no hemorrhage and no papilledema. The right eye shows no venous pulsations.       The left eye shows no arteriolar narrowing, no AV nicking, no exudate, no hemorrhage and no papilledema. The left eye shows no venous pulsations.  Neck: Normal range of motion, full passive range of motion without pain and phonation normal. Neck supple. No JVD present. No tracheal tenderness and no muscular tenderness present. No neck rigidity. No tracheal deviation, no edema, no erythema and normal range of motion present. No thyromegaly present.  Cardiovascular: Normal rate, regular rhythm, S1 normal, S2 normal, normal heart sounds and intact distal pulses.  Exam reveals no gallop and no friction rub.   No murmur heard. Pulmonary/Chest: Effort normal and breath sounds normal. No stridor. No respiratory distress. She has no decreased breath sounds. She has no wheezes. She has no rhonchi. She has no rales.  Lymphadenopathy:       Head (right side): No submental, no submandibular, no tonsillar, no preauricular and no posterior auricular adenopathy present.       Head (left side): No submental, no submandibular, no tonsillar, no preauricular and no posterior auricular adenopathy present.    She has no cervical adenopathy.       Right cervical: No superficial cervical, no deep cervical and no posterior  cervical adenopathy present.      Left cervical: No superficial cervical, no deep cervical and no posterior cervical adenopathy present.  Neurological: She is alert and oriented to person, place, and time. She has normal strength. She displays no atrophy and no tremor. No cranial nerve deficit or sensory deficit. She exhibits normal muscle tone. She displays a negative Romberg sign. She displays no seizure activity. Coordination and gait normal. GCS eye subscore is 4. GCS verbal subscore is 5. GCS motor subscore is 6.  Reflex Scores:      Brachioradialis reflexes are 1+ on the right side and 1+ on the left side.      Patellar reflexes are 1+ on the right side and 1+ on the left side.      Achilles reflexes are 1+ on the right side and 1+ on the left side. Reflexes 1+ and symmetric bilaterally.  Skin: Skin is warm and dry. No rash noted. She is not diaphoretic. No erythema. No pallor.  Psychiatric: She has a normal mood and affect. Her speech is normal and behavior is normal. Thought content normal.  Assessment & Plan:  1. Migraine without aura and with status migrainosus, not intractable Toradol and Phenergan injections given in clinic today. Recommended to go home and rest. Propronaolol prescription provided. Advised to keep appointment at headache clinic and follow-up with them. RTC or go to ED if symptoms are not improving or headache is worsening or different symptoms occur. - ketorolac (TORADOL) 30 MG/ML injection 30 mg; Inject 1 mL (30 mg total) into the muscle once. - promethazine (PHENERGAN) injection 25 mg; Inject 1 mL (25 mg total) into the muscle once. - propranolol ER (INDERAL LA) 60 MG 24 hr capsule; Take 1 capsule (60 mg total) by mouth at bedtime.  Dispense: 30 capsule; Refill: 1

## 2016-01-29 ENCOUNTER — Encounter: Payer: Self-pay | Admitting: Women's Health

## 2016-01-29 ENCOUNTER — Ambulatory Visit (INDEPENDENT_AMBULATORY_CARE_PROVIDER_SITE_OTHER): Payer: PRIVATE HEALTH INSURANCE | Admitting: Women's Health

## 2016-01-29 DIAGNOSIS — N760 Acute vaginitis: Secondary | ICD-10-CM | POA: Diagnosis not present

## 2016-01-29 DIAGNOSIS — B9689 Other specified bacterial agents as the cause of diseases classified elsewhere: Secondary | ICD-10-CM | POA: Diagnosis not present

## 2016-01-29 DIAGNOSIS — N390 Urinary tract infection, site not specified: Secondary | ICD-10-CM | POA: Diagnosis not present

## 2016-01-29 LAB — URINALYSIS W MICROSCOPIC + REFLEX CULTURE
BILIRUBIN URINE: NEGATIVE
CASTS: NONE SEEN [LPF]
CRYSTALS: NONE SEEN [HPF]
Glucose, UA: NEGATIVE
HGB URINE DIPSTICK: NEGATIVE
Ketones, ur: NEGATIVE
Nitrite: NEGATIVE
PROTEIN: NEGATIVE
RBC / HPF: NONE SEEN RBC/HPF (ref <=2)
Specific Gravity, Urine: 1.025 (ref 1.001–1.035)
Yeast: NONE SEEN [HPF]
pH: 5 (ref 5.0–8.0)

## 2016-01-29 LAB — WET PREP FOR TRICH, YEAST, CLUE
Trich, Wet Prep: NONE SEEN
Yeast Wet Prep HPF POC: NONE SEEN

## 2016-01-29 MED ORDER — SULFAMETHOXAZOLE-TRIMETHOPRIM 800-160 MG PO TABS: 1.0000 | ORAL_TABLET | Freq: Two times a day (BID) | ORAL | 0 refills | Status: DC

## 2016-01-29 MED ORDER — METRONIDAZOLE 500 MG PO TABS: 500.0000 mg | ORAL_TABLET | Freq: Two times a day (BID) | ORAL | 0 refills | Status: DC

## 2016-01-29 NOTE — Patient Instructions (Signed)
Bacterial Vaginosis Bacterial vaginosis is a vaginal infection that occurs when the normal balance of bacteria in the vagina is disrupted. It results from an overgrowth of certain bacteria. This is the most common vaginal infection among women ages 15-44. Because bacterial vaginosis increases your risk for STIs (sexually transmitted infections), getting treated can help reduce your risk for chlamydia, gonorrhea, herpes, and HIV (human immunodeficiency virus). Treatment is also important for preventing complications in pregnant women, because this condition can cause an early (premature) delivery. What are the causes? This condition is caused by an increase in harmful bacteria that are normally present in small amounts in the vagina. However, the reason that the condition develops is not fully understood. What increases the risk? The following factors may make you more likely to develop this condition:  Having a new sexual partner or multiple sexual partners.  Having unprotected sex.  Douching.  Having an intrauterine device (IUD).  Smoking.  Drug and alcohol abuse.  Taking certain antibiotic medicines.  Being pregnant.  You cannot get bacterial vaginosis from toilet seats, bedding, swimming pools, or contact with objects around you. What are the signs or symptoms? Symptoms of this condition include:  Grey or white vaginal discharge. The discharge can also be watery or foamy.  A fish-like odor with discharge, especially after sexual intercourse or during menstruation.  Itching in and around the vagina.  Burning or pain with urination.  Some women with bacterial vaginosis have no signs or symptoms. How is this diagnosed? This condition is diagnosed based on:  Your medical history.  A physical exam of the vagina.  Testing a sample of vaginal fluid under a microscope to look for a large amount of bad bacteria or abnormal cells. Your health care provider may use a cotton swab  or a small wooden spatula to collect the sample.  How is this treated? This condition is treated with antibiotics. These may be given as a pill, a vaginal cream, or a medicine that is put into the vagina (suppository). If the condition comes back after treatment, a second round of antibiotics may be needed. Follow these instructions at home: Medicines  Take over-the-counter and prescription medicines only as told by your health care provider.  Take or use your antibiotic as told by your health care provider. Do not stop taking or using the antibiotic even if you start to feel better. General instructions  If you have a female sexual partner, tell her that you have a vaginal infection. She should see her health care provider and be treated if she has symptoms. If you have a female sexual partner, he does not need treatment.  During treatment: ? Avoid sexual activity until you finish treatment. ? Do not douche. ? Avoid alcohol as directed by your health care provider. ? Avoid breastfeeding as directed by your health care provider.  Drink enough water and fluids to keep your urine clear or pale yellow.  Keep the area around your vagina and rectum clean. ? Wash the area daily with warm water. ? Wipe yourself from front to back after using the toilet.  Keep all follow-up visits as told by your health care provider. This is important. How is this prevented?  Do not douche.  Wash the outside of your vagina with warm water only.  Use protection when having sex. This includes latex condoms and dental dams.  Limit how many sexual partners you have. To help prevent bacterial vaginosis, it is best to have sex with just   one partner (monogamous).  Make sure you and your sexual partner are tested for STIs.  Wear cotton or cotton-lined underwear.  Avoid wearing tight pants and pantyhose, especially during summer.  Limit the amount of alcohol that you drink.  Do not use any products that  contain nicotine or tobacco, such as cigarettes and e-cigarettes. If you need help quitting, ask your health care provider.  Do not use illegal drugs. Where to find more information:  Centers for Disease Control and Prevention: www.cdc.gov/std  American Sexual Health Association (ASHA): www.ashastd.org  [place] Department of Health and Human Services, Office on Women's Health: www.womenshealth.gov/ or https://www.womenshealth.gov/a-z-topics/bacterial-vaginosis Contact a health care provider if:  Your symptoms do not improve, even after treatment.  You have more discharge or pain when urinating.  You have a fever.  You have pain in your abdomen.  You have pain during sex.  You have vaginal bleeding between periods. Summary  Bacterial vaginosis is a vaginal infection that occurs when the normal balance of bacteria in the vagina is disrupted.  Because bacterial vaginosis increases your risk for STIs (sexually transmitted infections), getting treated can help reduce your risk for chlamydia, gonorrhea, herpes, and HIV (human immunodeficiency virus). Treatment is also important for preventing complications in pregnant women, because the condition can cause an early (premature) delivery.  This condition is treated with antibiotic medicines. These may be given as a pill, a vaginal cream, or a medicine that is put into the vagina (suppository). This information is not intended to replace advice given to you by your health care provider. Make sure you discuss any questions you have with your health care provider. Document Released: 03/03/2005 Document Revised: 11/17/2015 Document Reviewed: 11/17/2015 Elsevier Interactive Patient Education  2017 [employer]  

## 2016-01-29 NOTE — Progress Notes (Signed)
Presents for evaluation of increased discharge for past 2 weeks. States that today she noticed blood in her urine. Denies puritis, urinary symptoms, foul odor to discharge, abdominal pain, back pain, fever, or chills. States she feels "yeasty." History of recurrent yeast, has been taking diflucan prophylacticly after intercourse. 02/2012 Mirena IUD amenorrheic.   Exam: Appears well. External genitalia normal. Speculum: scant white discharge on cervix. Bimanual: No CMT or adnexal tenderness. Wet prep:Few clue cells TNTC bacteria with moderate WBC . No CVAT. UA: Trace leukocytes, negative blood, 0-5 WBCs, no RBCs, 20-40 squamous epithelials, many bacteria   Bacterial Vaginitis Possible UTI  Plan: Urine culture pending. Metronidazole 500 mg BID X7 days. Alcohol precautions reviewed. Bactrim 800-160 MG BID X3 days prescribed, instructed to wait for urine culture before taking or if urinary symptoms increase. Instructed to call office if symptoms do not improve.

## 2016-01-30 LAB — URINE CULTURE: ORGANISM ID, BACTERIA: NO GROWTH

## 2016-01-31 ENCOUNTER — Encounter: Payer: Self-pay | Admitting: Women's Health

## 2016-03-31 ENCOUNTER — Ambulatory Visit (INDEPENDENT_AMBULATORY_CARE_PROVIDER_SITE_OTHER): Payer: PRIVATE HEALTH INSURANCE | Admitting: Physician Assistant

## 2016-03-31 VITALS — BP 116/82 | HR 102 | Resp 18 | Ht 65.0 in | Wt 190.0 lb

## 2016-03-31 DIAGNOSIS — K649 Unspecified hemorrhoids: Secondary | ICD-10-CM | POA: Diagnosis not present

## 2016-03-31 DIAGNOSIS — K644 Residual hemorrhoidal skin tags: Secondary | ICD-10-CM

## 2016-03-31 MED ORDER — HYDROCORTISONE ACETATE 25 MG RE SUPP: 25.0000 mg | Freq: Two times a day (BID) | RECTAL | 1 refills | Status: DC

## 2016-03-31 NOTE — Progress Notes (Signed)
Patient ID: Abigail Higgins, female    DOB: 23-Jan-1981, 36 y.o.   MRN: [id number]  PCP: No PCP Per Patient  Chief Complaint  Patient presents with  . Hemorrhoids    Subjective:   Presents for evaluation of possible hemorhoids.  Pt is a 36yo caucasian female who presents with possible hemorrhoids. She states that she has had pain and blood when wiping after bowel movements for the past month. During the past week, she starting having a stabbing pain when she sits down. She has tried preparation H cream and witch hazel wipes with no relief. She states that she can feel a "skin tag" near her rectum. She has a history of constipation, but it is now well controlled with miralax and senokot. She has one soft bowel movement each day.    Review of Systems In addition to that stated in HPI above: Const: Denies fever, chills, fatigue, or weight loss. Abd: Denies abdominal pain, nausea, vomiting, or diarrhea. GU: Denies vaginal itch, odor, or discharge.    Patient Active Problem List   Diagnosis Date Noted  . Migraine 01/24/2016  . History of alcoholism (HCC) 11/30/2014  . Eustachian tube dysfunction 09/29/2014  . Hyperlipidemia 06/26/2014  . Cigarette smoker one half pack a day or less 06/01/2014  . Rash 03/14/2013  . Other malaise and fatigue 03/14/2013  . IC (interstitial cystitis) 12/15/2012  . Adopted 05/18/2012  . Weight gain 05/18/2012  . Bipolar I disorder (HCC)   . CIN I (cervical intraepithelial neoplasia I)      Prior to Admission medications   Medication Sig Start Date End Date Taking? Authorizing Provider  ALPRAZolam (XANAX) 0.25 MG tablet Take 0.25 mg by mouth 4 (four) times daily as needed.    Yes Historical Provider, MD  ARIPiprazole (ABILIFY) 10 MG tablet Take 10 mg by mouth daily.   Yes Historical Provider, MD  clobetasol cream (TEMOVATE) 0.05 % Apply 1 application topically 2 (two) times daily as needed.   Yes Historical Provider, MD  clonazePAM (KLONOPIN)  1 MG tablet Take 1 tablet by mouth daily. 09/26/15  Yes Historical Provider, MD  levonorgestrel (MIRENA) 20 MCG/24HR IUD 1 each by Intrauterine route once. Reported on 05/29/2015   Yes Historical Provider, MD  lisdexamfetamine (VYVANSE) 50 MG capsule Take 50 mg by mouth every morning.   Yes Historical Provider, MD  oxybutynin (DITROPAN-XL) 10 MG 24 hr tablet Take 1 tablet by mouth daily. Reported on 05/29/2015 11/10/14  Yes Historical Provider, MD  QUEtiapine (SEROQUEL XR) 50 MG TB24 24 hr tablet Take 2 tablets by mouth at bedtime. 09/11/15  Yes Historical Provider, MD  glycopyrrolate (ROBINUL) 1 MG tablet Take 1 tablet by mouth 2 (two) times daily. 09/20/15   Historical Provider, MD  metroNIDAZOLE (FLAGYL) 500 MG tablet Take 1 tablet (500 mg total) by mouth 2 (two) times daily. Patient not taking: Reported on 03/31/2016 01/29/16   Harrington Challenger, NP  propranolol ER (INDERAL LA) 60 MG 24 hr capsule Take 1 capsule (60 mg total) by mouth at bedtime. Patient not taking: Reported on 03/31/2016 01/24/16   Porfirio Oar, PA-C  sulfamethoxazole-trimethoprim (BACTRIM DS) 800-160 MG tablet Take 1 tablet by mouth 2 (two) times daily. Patient not taking: Reported on 03/31/2016 01/29/16   Harrington Challenger, NP     No Known Allergies     Objective:  Physical Exam  Genitourinary:       External rectum exam: Tenderness to palpation of rectum circumferentially. No bleeding  or thrombosed hemorrhoids noted. Signs of interior hemorrhoids.        Assessment & Plan:   1. Residual hemorrhoidal skin tags   2. Bleeding hemorrhoid Pt advised to continue stool softening regimen. Pt advised to return if symptoms persist or if new or worrisome symptoms arise. - hydrocortisone (ANUSOL-HC) 25 MG suppository; Place 1 suppository (25 mg total) rectally 2 (two) times daily.  Dispense: 12 suppository; Refill: 1   Georgiana SpinnerHannah Bradley Dominique Calvey, PA-S

## 2016-03-31 NOTE — Progress Notes (Signed)
Patient ID: Abigail Higgins, female    DOB: 1981/01/29, 36 y.o.   MRN: [id number]  PCP: No PCP Per Patient  Chief Complaint  Patient presents with  . Hemorrhoids    Subjective:   Presents for evaluation of possible hemorrhoids.  Pain and bright red blood with defecation x 1 month. Now having pain with sitting x 1 month. Sharp/stabbing. Preparation H and witch hazel without benefit. Feels a "skin tag" at the anus. History of constipation, well controlled with Miralax and Senekot, reporting 1 soft BM daily.  No abdominal pain. No nausea/vomiting. No melena. No fever/chills.    Review of Systems As above.    Patient Active Problem List   Diagnosis Date Noted  . Migraine 01/24/2016  . History of alcoholism (HCC) 11/30/2014  . Eustachian tube dysfunction 09/29/2014  . Hyperlipidemia 06/26/2014  . Cigarette smoker one half pack a day or less 06/01/2014  . Rash 03/14/2013  . Other malaise and fatigue 03/14/2013  . IC (interstitial cystitis) 12/15/2012  . Adopted 05/18/2012  . Weight gain 05/18/2012  . Bipolar I disorder (HCC)   . CIN I (cervical intraepithelial neoplasia I)      Prior to Admission medications   Medication Sig Start Date End Date Taking? Authorizing Provider  ALPRAZolam (XANAX) 0.25 MG tablet Take 0.25 mg by mouth 4 (four) times daily as needed.    Yes Historical Provider, MD  ARIPiprazole (ABILIFY) 10 MG tablet Take 10 mg by mouth daily.   Yes Historical Provider, MD  clobetasol cream (TEMOVATE) 0.05 % Apply 1 application topically 2 (two) times daily as needed.   Yes Historical Provider, MD  clonazePAM (KLONOPIN) 1 MG tablet Take 1 tablet by mouth daily. 09/26/15  Yes Historical Provider, MD  levonorgestrel (MIRENA) 20 MCG/24HR IUD 1 each by Intrauterine route once. Reported on 05/29/2015   Yes Historical Provider, MD  lisdexamfetamine (VYVANSE) 50 MG capsule Take 50 mg by mouth every morning.   Yes Historical Provider, MD  oxybutynin (DITROPAN-XL)  10 MG 24 hr tablet Take 1 tablet by mouth daily. Reported on 05/29/2015 11/10/14  Yes Historical Provider, MD  QUEtiapine (SEROQUEL XR) 50 MG TB24 24 hr tablet Take 2 tablets by mouth at bedtime. 09/11/15  Yes Historical Provider, MD  glycopyrrolate (ROBINUL) 1 MG tablet Take 1 tablet by mouth 2 (two) times daily. 09/20/15   Historical Provider, MD  tiZANidine (ZANAFLEX) 4 MG tablet  03/18/16   Historical Provider, MD  zonisamide (ZONEGRAN) 25 MG capsule  03/05/16   Historical Provider, MD     No Known Allergies     Objective:  Physical Exam  Constitutional: She is oriented to person, place, and time. She appears well-developed and well-nourished. She is active and cooperative. No distress.  BP 116/82 (BP Location: Right Arm, Patient Position: Sitting, Cuff Size: Small)   Pulse (!) 102   Resp 18   Ht 5\' 5"  (1.651 m)   Wt 190 lb (86.2 kg)   SpO2 98%   BMI 31.62 kg/m    Eyes: Conjunctivae are normal.  Pulmonary/Chest: Effort normal.  Genitourinary: Rectal exam shows external hemorrhoid (skin tag noted, tenderness at the anal verge with mild erythema and induration, but no eccymosis ). Rectal exam shows no fissure. Pelvic exam was performed with patient in the knee-chest position.  Neurological: She is alert and oriented to person, place, and time.  Psychiatric: She has a normal mood and affect. Her speech is normal and behavior is normal.  Assessment & Plan:   1. Residual hemorrhoidal skin tags 2. Bleeding hemorrhoid Likely has some additional hemorrhoids just inside the anus. Suppositories. Anticipatory guidance. If no improvement, will need anoscopy +/- specialist evaluation. - hydrocortisone (ANUSOL-HC) 25 MG suppository; Place 1 suppository (25 mg total) rectally 2 (two) times daily.  Dispense: 12 suppository; Refill: 1   Fernande Bras, PA-C Physician Assistant-Certified Primary Care at [facility]

## 2016-03-31 NOTE — Patient Instructions (Addendum)
Use the LONG pantiliners to collect any drainage from the suppository.    IF you received an x-ray today, you will receive an invoice from [facility]. Please contact [facility] at [phone] with questions or concerns regarding your invoice.   IF you received labwork today, you will receive an invoice from [facility]. Please contact [facility] at [phone] with questions or concerns regarding your invoice.   Our billing staff will not be able to assist you with questions regarding bills from these companies.  You will be contacted with the lab results as soon as they are available. The fastest way to get your results is to activate your My Chart account. Instructions are located on the last page of this paperwork. If you have not heard from us regarding the results in 2 weeks, please contact this office.

## 2016-07-30 ENCOUNTER — Encounter: Payer: Self-pay | Admitting: Gynecology

## 2016-12-26 ENCOUNTER — Ambulatory Visit (INDEPENDENT_AMBULATORY_CARE_PROVIDER_SITE_OTHER): Payer: PRIVATE HEALTH INSURANCE | Admitting: Physician Assistant

## 2016-12-26 ENCOUNTER — Encounter: Payer: Self-pay | Admitting: Physician Assistant

## 2016-12-26 VITALS — BP 122/88 | HR 113 | Temp 98.7°F | Resp 18 | Ht 65.0 in | Wt 189.6 lb

## 2016-12-26 DIAGNOSIS — Z23 Encounter for immunization: Secondary | ICD-10-CM | POA: Diagnosis not present

## 2016-12-26 DIAGNOSIS — N3010 Interstitial cystitis (chronic) without hematuria: Secondary | ICD-10-CM | POA: Diagnosis not present

## 2016-12-26 DIAGNOSIS — R000 Tachycardia, unspecified: Secondary | ICD-10-CM

## 2016-12-26 DIAGNOSIS — R300 Dysuria: Secondary | ICD-10-CM | POA: Diagnosis not present

## 2016-12-26 LAB — POCT URINALYSIS DIP (MANUAL ENTRY)
BILIRUBIN UA: NEGATIVE
Glucose, UA: NEGATIVE mg/dL
Ketones, POC UA: NEGATIVE mg/dL
NITRITE UA: NEGATIVE
PH UA: 5 (ref 5.0–8.0)
PROTEIN UA: NEGATIVE mg/dL
Spec Grav, UA: >=1.03 — AB (ref 1.010–1.025)
Urobilinogen, UA: 0.2 E.U./dL

## 2016-12-26 MED ORDER — SULFAMETHOXAZOLE-TRIMETHOPRIM 800-160 MG PO TABS: 1.0000 | ORAL_TABLET | Freq: Two times a day (BID) | ORAL | 0 refills | Status: DC

## 2016-12-26 MED ORDER — FLUCONAZOLE 150 MG PO TABS: 150.0000 mg | ORAL_TABLET | Freq: Once | ORAL | 0 refills | Status: AC

## 2016-12-26 NOTE — Progress Notes (Signed)
Patient ID: Abigail Higgins, female    DOB: [birth year], 36 y.o.   MRN: [id number]  PCP: Patient, No Pcp Per  Chief Complaint  Patient presents with  . Dysuria    x2 days, pt states she is having some burning sensations and frequent urination.   . Vaginitis    x2 weeks, pt states she may have a yeast infection because she is having some discharge. Pt reports no pressure or pain.    Subjective:   Presents for evaluation of urinary and vaginal symptoms.  She relates 2 days of dysuria and increased urinary frequency, and 2 weeks of atypical vaginal discharge.  When I mentioned interstitial cystitis, on her problem list, she reports that she doesn't know what that is or that she has it. Her chart is reviewed with her. Urology visit 2014, Dr. Patsi Sears. Reduced caffeine, sugary drinks, she also had a urethral dilation that worked really well for a while.  She's had 2 episodes of dysuria in the past 6 months, and used [facility], who prescribed nitrofurantoin, most recently in August.  No fever, chills, nausea, vomiting, back/abdominal pain. No hematuria. No incontinence. Monogamous sex.    Review of Systems As above.    Patient Active Problem List   Diagnosis Date Noted  . Migraine 01/24/2016  . History of alcoholism (HCC) 11/30/2014  . Eustachian tube dysfunction 09/29/2014  . Hyperlipidemia 06/26/2014  . Cigarette smoker one half pack a day or less 06/01/2014  . Rash 03/14/2013  . Other malaise and fatigue 03/14/2013  . IC (interstitial cystitis) 12/15/2012  . Adopted 05/18/2012  . Weight gain 05/18/2012  . Bipolar I disorder (HCC)   . CIN I (cervical intraepithelial neoplasia I)      Prior to Admission medications   Medication Sig Start Date End Date Taking? Authorizing Provider  ALPRAZolam (XANAX) 0.25 MG tablet Take 0.25 mg by mouth 4 (four) times daily as needed.    Yes [provider]  ARIPiprazole (ABILIFY) 10 MG tablet Take 10 mg by mouth  daily.   Yes [provider]  clobetasol cream (TEMOVATE) 0.05 % Apply 1 application topically 2 (two) times daily as needed.   Yes [provider]  hydrocortisone (ANUSOL-HC) 25 MG suppository Place 1 suppository (25 mg total) rectally 2 (two) times daily. 03/31/16  Yes Shanika Levings, Avelino Leeds, PA-C  levonorgestrel (MIRENA) 20 MCG/24HR IUD 1 each by Intrauterine route once. Reported on 05/29/2015   Yes [provider]  lisdexamfetamine (VYVANSE) 50 MG capsule Take 50 mg by mouth every morning.   Yes [provider]  oxybutynin (DITROPAN-XL) 10 MG 24 hr tablet Take 1 tablet by mouth daily. Reported on 05/29/2015 11/10/14  Yes [provider]  QUEtiapine (SEROQUEL XR) 50 MG TB24 24 hr tablet Take 2 tablets by mouth at bedtime. 09/11/15  Yes [provider]  traZODone (DESYREL) 150 MG tablet Take 150 mg by mouth at bedtime.   Yes [provider]  clonazePAM (KLONOPIN) 1 MG tablet Take 1 tablet by mouth daily. 09/26/15   [provider]  glycopyrrolate (ROBINUL) 1 MG tablet Take 1 tablet by mouth 2 (two) times daily. 09/20/15   [provider]  tiZANidine (ZANAFLEX) 4 MG tablet  03/18/16   [provider]  zonisamide (ZONEGRAN) 25 MG capsule  03/05/16   [provider]     No Known Allergies     Objective:  Physical Exam  Constitutional: She is oriented to person, place, and time. She appears  well-developed and well-nourished. She is active and cooperative. No distress.  BP 122/88 (BP Location: Left Arm, Patient Position: Sitting, Cuff Size: Normal)   Pulse (!) 113   Temp 98.7 F (37.1 C) (Oral)   Resp 18   Ht  (1.651 m)   Wt 189 lb 9.6 oz (86 kg)   SpO2 98%   BMI 31.55 kg/m   HENT:  Head: Normocephalic and atraumatic.  Right Ear: Hearing normal.  Left Ear: Hearing normal.  Eyes: Conjunctivae are normal. No scleral icterus.  Neck: Normal range of motion. Neck supple. No thyromegaly present.    Cardiovascular: Normal rate, regular rhythm and normal heart sounds.   Pulses:      Radial pulses are 2+ on the right side, and 2+ on the left side.  Pulmonary/Chest: Effort normal and breath sounds normal.  Abdominal: Soft. Bowel sounds are normal. She exhibits no distension and no mass. There is no tenderness. There is no rebound and no guarding.  Lymphadenopathy:       Head (right side): No tonsillar, no preauricular, no posterior auricular and no occipital adenopathy present.       Head (left side): No tonsillar, no preauricular, no posterior auricular and no occipital adenopathy present.    She has no cervical adenopathy.       Right: No supraclavicular adenopathy present.       Left: No supraclavicular adenopathy present.  Neurological: She is alert and oriented to person, place, and time. No sensory deficit.  Skin: Skin is warm, dry and intact. No rash noted. No cyanosis or erythema. Nails show no clubbing.  Psychiatric: She has a normal mood and affect. Her speech is normal and behavior is normal.       Results for orders placed or performed in visit on 12/26/16  POCT urinalysis dipstick  Result Value Ref Range   Color, UA yellow yellow   Clarity, UA cloudy (A) clear   Glucose, UA negative negative mg/dL   Bilirubin, UA negative negative   Ketones, POC UA negative negative mg/dL   Spec Grav, UA >=1.478 (A) 1.010 - 1.025   Blood, UA trace-intact (A) negative   pH, UA 5.0 5.0 - 8.0   Protein Ur, POC negative negative mg/dL   Urobilinogen, UA 0.2 0.2 or 1.0 E.U./dL   Nitrite, UA Negative Negative   Leukocytes, UA Moderate (2+) (A) Negative       Assessment & Plan:   Problem List Items Addressed This Visit    IC (interstitial cystitis)    Diagnosis made by urology in 2014, but patient unaware. Suspect current symptoms represent episode of IC. Await UCx. Refer back to urology.      Relevant Orders   Ambulatory referral to Urology    Other Visit Diagnoses     Dysuria    -  Primary   Treat for possible UTI, but concerned for IC. Await UCx.   Relevant Medications   sulfamethoxazole-trimethoprim (BACTRIM DS,SEPTRA DS) 800-160 MG tablet   Other Relevant Orders   POCT urinalysis dipstick (Completed)   Urine Microscopic (Completed)   Urine Culture (Completed)   Need for influenza vaccination       Relevant Orders   Flu Vaccine QUAD 36+ mos IM (Completed)       Return for evaluation of rapid heart rate in the next 1-2 months.   Fernande Bras, PA-C Primary Care at [facility]

## 2016-12-26 NOTE — Patient Instructions (Addendum)
   IF you received an x-ray today, you will receive an invoice from [facility]. Please contact [facility] at [phone] with questions or concerns regarding your invoice.   IF you received labwork today, you will receive an invoice from [facility]. Please contact [facility] at [phone] with questions or concerns regarding your invoice.   Our billing staff will not be able to assist you with questions regarding bills from these companies.  You will be contacted with the lab results as soon as they are available. The fastest way to get your results is to activate your My Chart account. Instructions are located on the last page of this paperwork. If you have not heard from us regarding the results in 2 weeks, please contact this office.     Interstitial Cystitis Interstitial cystitis is a condition that causes inflammation of the bladder. The bladder is a hollow organ in the lower part of your abdomen. It stores urine after the urine is made by your kidneys. With interstitial cystitis, you may have pain in the bladder area. You may also have a frequent and urgent need to urinate. The severity of interstitial cystitis can vary from person to person. You may have flare-ups of the condition, and then it may go away for a while. For many people who have this condition, it becomes a long-term problem. What are the causes? The cause of this condition is not known. What increases the risk? This condition is more likely to develop in women. What are the signs or symptoms? Symptoms of interstitial cystitis vary, and they can change over time. Symptoms may include:  Discomfort or pain in the bladder area. This can range from mild to severe. The pain may change in intensity as the bladder fills with urine or as it empties.  Pelvic pain.  An urgent need to urinate.  Frequent urination.  Pain during sexual intercourse.  Pinpoint bleeding on the bladder wall.  For women, the  symptoms often get worse during menstruation. How is this diagnosed? This condition is diagnosed by evaluating your symptoms and ruling out other causes. A physical exam will be done. Various tests may be done to rule out other conditions. Common tests include:  Urine tests.  Cystoscopy. In this test, a tool that is like a very thin telescope is used to look into your bladder.  Biopsy. This involves taking a sample of tissue from the bladder wall to be examined under a microscope.  How is this treated? There is no cure for interstitial cystitis, but treatment methods are available to control your symptoms. Work closely with your health care provider to find the treatments that will be most effective for you. Treatment options may include:  Medicines to relieve pain and to help reduce the number of times that you feel the need to urinate.  Bladder training. This involves learning ways to control when you urinate, such as: ? Urinating at scheduled times. ? Training yourself to delay urination. ? Doing exercises (Kegel exercises) to strengthen the muscles that control urine flow.  Lifestyle changes, such as changing your diet or taking steps to control stress.  Use of a device that provides electrical stimulation in order to reduce pain.  A procedure that stretches your bladder by filling it with air or fluid.  Surgery. This is rare. It is only done for extreme cases if other treatments do not help.  Follow these instructions at home:  Take medicines only as directed by your health care   provider.  Use bladder training techniques as directed. ? Keep a bladder diary to find out which foods, liquids, or activities make your symptoms worse. ? Use your bladder diary to schedule bathroom trips. If you are away from home, plan to be near a bathroom at each of your scheduled times. ? Make sure you urinate just before you leave the house and just before you go to bed.  Do Kegel exercises as  directed by your health care provider.  Do not drink alcohol.  Do not use any tobacco products, including cigarettes, chewing tobacco, or electronic cigarettes. If you need help quitting, ask your health care provider.  Make dietary changes as directed by your health care provider. You may need to avoid spicy foods and foods that contain a high amount of potassium.  Limit your drinking of beverages that stimulate urination. These include soda, coffee, and tea.  Keep all follow-up visits as directed by your health care provider. This is important. Contact a health care provider if:  Your symptoms do not get better after treatment.  Your pain and discomfort are getting worse.  You have more frequent urges to urinate.  You have a fever. Get help right away if:  You are not able to control your bladder at all. This information is not intended to replace advice given to you by your health care provider. Make sure you discuss any questions you have with your health care provider. Document Released: 11/02/2003 Document Revised: 08/09/2015 Document Reviewed: 11/08/2013 Elsevier Interactive Patient Education  2018 Elsevier Inc.  

## 2016-12-27 LAB — URINALYSIS, MICROSCOPIC ONLY: Casts: NONE SEEN /lpf

## 2016-12-27 LAB — URINE CULTURE

## 2017-01-03 ENCOUNTER — Encounter: Payer: Self-pay | Admitting: Physician Assistant

## 2017-01-03 DIAGNOSIS — R000 Tachycardia, unspecified: Secondary | ICD-10-CM | POA: Insufficient documentation

## 2017-01-03 NOTE — Assessment & Plan Note (Signed)
Diagnosis made by urology in 2014, but patient unaware. Suspect current symptoms represent episode of IC. Await UCx. Refer back to urology.

## 2017-02-04 ENCOUNTER — Encounter: Payer: Self-pay | Admitting: Gynecology

## 2017-02-04 ENCOUNTER — Ambulatory Visit: Payer: PRIVATE HEALTH INSURANCE | Admitting: Gynecology

## 2017-02-04 VITALS — BP 130/84 | Ht 65.0 in | Wt 191.0 lb

## 2017-02-04 DIAGNOSIS — Z30431 Encounter for routine checking of intrauterine contraceptive device: Secondary | ICD-10-CM

## 2017-02-04 DIAGNOSIS — N760 Acute vaginitis: Secondary | ICD-10-CM | POA: Diagnosis not present

## 2017-02-04 DIAGNOSIS — N898 Other specified noninflammatory disorders of vagina: Secondary | ICD-10-CM

## 2017-02-04 DIAGNOSIS — Z01411 Encounter for gynecological examination (general) (routine) with abnormal findings: Secondary | ICD-10-CM | POA: Diagnosis not present

## 2017-02-04 DIAGNOSIS — B9689 Other specified bacterial agents as the cause of diseases classified elsewhere: Secondary | ICD-10-CM | POA: Diagnosis not present

## 2017-02-04 LAB — WET PREP FOR TRICH, YEAST, CLUE

## 2017-02-04 MED ORDER — METRONIDAZOLE 500 MG PO TABS: 500.0000 mg | ORAL_TABLET | Freq: Two times a day (BID) | ORAL | 0 refills | Status: DC

## 2017-02-04 NOTE — Progress Notes (Signed)
    Abigail Gallantileen E Lyttle Dec 27, 1980 [id number]        36 y.o.  G0P0 for annual gynecologic exam.  Also complaining of a vaginal discharge, watery in nature with irritation of the past 2 months.  No real itching.  No odor.  No urinary symptoms currently such as frequency dysuria urgency although she has been treated for UTIs over this past year.  Has been diagnosed with interstitial cystitis and has an appointment to see the urologist coming up.  Past medical history,surgical history, problem list, medications, allergies, family history and social history were all reviewed and documented as reviewed in the EPIC chart.  ROS:  Performed with pertinent positives and negatives included in the history, assessment and plan.   Additional significant findings : None   Exam: Kennon PortelaKim Gardner assistant Vitals:   02/04/17 1550  BP: 130/84  Weight: 191 lb (86.6 kg)  Height: 5\' 5"  (1.651 m)   Body mass index is 31.78 kg/m.  General appearance:  Normal affect, orientation and appearance. Skin: Grossly normal HEENT: Without gross lesions.  No cervical or supraclavicular adenopathy. Thyroid normal.  Lungs:  Clear without wheezing, rales or rhonchi Cardiac: RR, without RMG Abdominal:  Soft, nontender, without masses, guarding, rebound, organomegaly or hernia Breasts:  Examined lying and sitting without masses, retractions, discharge or axillary adenopathy. Pelvic:  Ext, BUS, Vagina: With frothy white discharge.  Cervix: Normal with IUD string visualized at the external loss  Uterus: Anteverted, normal size, shape and contour, midline and mobile nontender   Adnexa: Without masses or tenderness    Anus and perineum: Normal   Rectovaginal: Normal sphincter tone without palpated masses or tenderness.    Assessment/Plan:  36 y.o. G0P0 female for annual gynecologic exam without menses, Mirena IUD.   1. Mirena IUD due to be replaced next month.  Patient is going to make an appointment to have this  done. 2. Vaginal discharge.  History and wet prep consistent with bacterial vaginosis.  Screening.  Options for management reviewed.  Will treat with Flagyl 500 mg twice daily times 7 days.  Patient does not drink alcohol.  Follow-up if symptoms persist, worsen or recur. 3. History of recurrent UTIs.  Has appointment to see urology in reference to interstitial cystitis.  Options for postcoital antibiotic prophylaxis discussed.  Patient wants to ask the urologist about this and follow-up with them in reference to this. 4. Breast health.  SBE monthly reviewed.  Will plan baseline mammogram at age 36.  Without strong family history of breast cancer. 5. Pap smear/HPV 05/2014.  No Pap smear done today.  No history of significant abnormal Pap smears.  Plan repeat Pap smear at 5-year interval per current screening guidelines. 6. Health maintenance.  No routine lab work done as this is done elsewhere.  Follow-up for IUD replacement exam.  Follow-up in 1 year for annual exam.  Additional time in excess of her routine gynecologic exam was spent in direct face to face counseling and coordination of care in regards to her vaginal discharge with treatment provided.    Dara Lordsimothy P Mandeep Ferch MD, 4:24 PM 02/04/2017

## 2017-02-04 NOTE — Addendum Note (Signed)
Addended by: Dayna BarkerGARDNER, Shelton Soler K on: 02/04/2017 04:29 PM   Modules accepted: Orders

## 2017-02-04 NOTE — Patient Instructions (Signed)
Take the Flagyl medication twice daily for 7 days.  Ask the urologist about your history of recurrent urinary tract infections and whether they think a prophylactic postcoital antibiotic pill with intercourse would help prevent UTIs from occurring.  Follow-up for your IUD replacement over the next month or 2.  Follow-up for your annual exam in 1 year

## 2017-02-24 ENCOUNTER — Ambulatory Visit: Payer: PRIVATE HEALTH INSURANCE | Admitting: Physician Assistant

## 2017-03-04 ENCOUNTER — Ambulatory Visit: Payer: PRIVATE HEALTH INSURANCE | Admitting: Physician Assistant

## 2017-03-04 ENCOUNTER — Encounter: Payer: Self-pay | Admitting: Physician Assistant

## 2017-03-04 VITALS — BP 110/88 | HR 96 | Temp 98.2°F | Resp 18 | Ht 65.0 in | Wt 189.0 lb

## 2017-03-04 DIAGNOSIS — R000 Tachycardia, unspecified: Secondary | ICD-10-CM

## 2017-03-04 DIAGNOSIS — E785 Hyperlipidemia, unspecified: Secondary | ICD-10-CM | POA: Diagnosis not present

## 2017-03-04 NOTE — Patient Instructions (Addendum)
Phillip HealJane Steiner, MD [phone]  Hardie LoraMoheed Akintayo, MD [phone]  Meredith Staggersarey Cottle, MD [phone]) [phone]  Len Blalockavid Fuller, MD [phone]     IF you received an x-ray today, you will receive an invoice from [facility]. Please contact [facility] at [phone] with questions or concerns regarding your invoice.   IF you received labwork today, you will receive an invoice from [facility]. Please contact [facility] at [phone] with questions or concerns regarding your invoice.   Our billing staff will not be able to assist you with questions regarding bills from these companies.  You will be contacted with the lab results as soon as they are available. The fastest way to get your results is to activate your My Chart account. Instructions are located on the last page of this paperwork. If you have not heard from us regarding the results in 2 weeks, please contact this office.

## 2017-03-04 NOTE — Progress Notes (Signed)
Patient ID: Abigail Higgins, female    DOB: 07-16-1980, [age] y.o.   MRN: [id number]  PCP: Abigail OarJeffery, Quenton Recendez, PA-C  Chief Complaint  Patient presents with  . Tachycardia    Pt states that her psychiatrist thinks that her heart rate is too high.    Subjective:   Presents for evaluation of tachycardia.  Her psychiatric prescriber is concerned that Vyvanse is causing tachycardia. At her most recent visit there, her pulse was 110, then 113 on recheck. Her visit was after work, she didn't feel calm/relaxed. [name], NP isn't in network with her insurance, so she is paying $120/visit.  No palpitations, no racing heart. No CP, SOB, blurred vision, dizziness.  She has seen urology, who confirmed interstitial cystitis. He advised that she use OTC azo as needed.   Review of Systems As above.    Patient Active Problem List   Diagnosis Date Noted  . Tachycardia 01/03/2017  . Migraine 01/24/2016  . Alcoholism in remission (HCC) 11/30/2014  . Eustachian tube dysfunction 09/29/2014  . Hyperlipidemia 06/26/2014  . Cigarette smoker one half pack a day or less 06/01/2014  . Rash 03/14/2013  . Other malaise and fatigue 03/14/2013  . IC (interstitial cystitis) 12/15/2012  . Adopted 05/18/2012  . Weight gain 05/18/2012  . Bipolar I disorder (HCC)   . CIN I (cervical intraepithelial neoplasia I)      Prior to Admission medications   Medication Sig Start Date End Date Taking? Authorizing Provider  ALPRAZolam (XANAX) 0.25 MG tablet Take 0.25 mg by mouth 4 (four) times daily as needed.    Yes [provider]  ARIPiprazole (ABILIFY) 10 MG tablet Take 10 mg by mouth daily.   Yes [provider]  clobetasol cream (TEMOVATE) 0.05 % Apply 1 application topically 2 (two) times daily as needed.   Yes [provider]  hydrocortisone (ANUSOL-HC) 25 MG suppository Place 1 suppository (25 mg total) rectally 2 (two) times daily. 03/31/16  Yes Nayla Dias, Avelino Leedshelle, PA-C    levonorgestrel (MIRENA) 20 MCG/24HR IUD 1 each by Intrauterine route once. Reported on 05/29/2015   Yes [provider]  lisdexamfetamine (VYVANSE) 50 MG capsule Take 50 mg by mouth every morning.   Yes [provider]  metroNIDAZOLE (FLAGYL) 500 MG tablet Take 1 tablet (500 mg total) by mouth 2 (two) times daily. 02/04/17  Yes Fontaine, Nadyne Coombesimothy P, MD  oxybutynin (DITROPAN-XL) 10 MG 24 hr tablet Take 1 tablet by mouth daily. Reported on 05/29/2015 11/10/14  Yes [provider]  QUEtiapine (SEROQUEL XR) 50 MG TB24 24 hr tablet Take 2 tablets by mouth at bedtime. 09/11/15  Yes [provider]  sulfamethoxazole-trimethoprim (BACTRIM DS,SEPTRA DS) 800-160 MG tablet Take 1 tablet by mouth 2 (two) times daily. 12/26/16  Yes Meriem Lemieux, PA-C  traZODone (DESYREL) 150 MG tablet Take 150 mg by mouth at bedtime.   Yes [provider]  clonazePAM (KLONOPIN) 1 MG tablet Take 1 tablet by mouth daily. 09/26/15   [provider]  glycopyrrolate (ROBINUL) 1 MG tablet Take 1 tablet by mouth 2 (two) times daily. 09/20/15   [provider]  tiZANidine (ZANAFLEX) 4 MG tablet  03/18/16   [provider]  zonisamide (ZONEGRAN) 25 MG capsule  03/05/16   [provider]     No Known Allergies     Objective:  Physical Exam  Constitutional: She is oriented to person, place, and time. She appears well-developed and well-nourished. She is active and cooperative. No  distress.  BP 110/88 (BP Location: Left Arm, Patient Position: Sitting, Cuff Size: Normal)   Pulse 96   Temp 98.2 F (36.8 C) (Oral)   Resp 18   Ht 5\' 5"  (1.651 m)   Wt 189 lb (85.7 kg)   SpO2 99%   BMI 31.45 kg/m   HENT:  Head: Normocephalic and atraumatic.  Right Ear: Hearing normal.  Left Ear: Hearing normal.  Eyes: Conjunctivae are normal. No scleral icterus.  Neck: Normal range of motion. Neck supple. No thyromegaly present.  Cardiovascular: Normal rate, regular  rhythm and normal heart sounds.  Pulses:      Radial pulses are 2+ on the right side, and 2+ on the left side.  Pulmonary/Chest: Effort normal and breath sounds normal.  Lymphadenopathy:       Head (right side): No tonsillar, no preauricular, no posterior auricular and no occipital adenopathy present.       Head (left side): No tonsillar, no preauricular, no posterior auricular and no occipital adenopathy present.    She has no cervical adenopathy.       Right: No supraclavicular adenopathy present.       Left: No supraclavicular adenopathy present.  Neurological: She is alert and oriented to person, place, and time. No sensory deficit.  Skin: Skin is warm, dry and intact. No rash noted. No cyanosis or erythema. Nails show no clubbing.  Psychiatric: She has a normal mood and affect. Her speech is normal and behavior is normal.    EKG reviewed with Dr. Katrinka BlazingSmith. NSR. Rate 87. PR 160. QT 374. EKG ordered but not performed 08/06/2012. No tracing available for comparison.      Assessment & Plan:   Problem List Items Addressed This Visit    Hyperlipidemia    Await lab results.      Relevant Orders   Lipid panel (Completed)   Tachycardia - Primary    Mild today.  Asymptomatic.  Reassuring EKG.  Await lab results.  Okay to continue stimulant therapy.      Relevant Orders   EKG 12-Lead (Completed)   CBC with Differential/Platelet (Completed)   Comprehensive metabolic panel (Completed)   TSH (Completed)       Return for re-evalaution, pending lab results.   Fernande Brashelle S. Jakara Blatter, PA-C Primary Care at [facility]

## 2017-03-05 ENCOUNTER — Encounter: Payer: Self-pay | Admitting: Gynecology

## 2017-03-05 ENCOUNTER — Ambulatory Visit (INDEPENDENT_AMBULATORY_CARE_PROVIDER_SITE_OTHER): Payer: PRIVATE HEALTH INSURANCE | Admitting: Gynecology

## 2017-03-05 VITALS — BP 118/78

## 2017-03-05 DIAGNOSIS — Z30433 Encounter for removal and reinsertion of intrauterine contraceptive device: Secondary | ICD-10-CM | POA: Diagnosis not present

## 2017-03-05 HISTORY — PX: INTRAUTERINE DEVICE INSERTION: SHX323

## 2017-03-05 LAB — CBC WITH DIFFERENTIAL/PLATELET
BASOS ABS: 0 10*3/uL (ref 0.0–0.2)
Basos: 0 %
EOS (ABSOLUTE): 0.1 10*3/uL (ref 0.0–0.4)
Eos: 1 %
Hematocrit: 41 % (ref 34.0–46.6)
Hemoglobin: 13.8 g/dL (ref 11.1–15.9)
IMMATURE GRANULOCYTES: 0 %
Immature Grans (Abs): 0 10*3/uL (ref 0.0–0.1)
LYMPHS ABS: 2.5 10*3/uL (ref 0.7–3.1)
Lymphs: 33 %
MCH: 30.7 pg (ref 26.6–33.0)
MCHC: 33.7 g/dL (ref 31.5–35.7)
MCV: 91 fL (ref 79–97)
MONOCYTES: 4 %
MONOS ABS: 0.3 10*3/uL (ref 0.1–0.9)
NEUTROS PCT: 62 %
Neutrophils Absolute: 4.7 10*3/uL (ref 1.4–7.0)
Platelets: 256 10*3/uL (ref 150–379)
RBC: 4.5 x10E6/uL (ref 3.77–5.28)
RDW: 14.2 % (ref 12.3–15.4)
WBC: 7.7 10*3/uL (ref 3.4–10.8)

## 2017-03-05 LAB — COMPREHENSIVE METABOLIC PANEL
ALK PHOS: 59 IU/L (ref 39–117)
ALT: 19 IU/L (ref 0–32)
AST: 21 IU/L (ref 0–40)
Albumin/Globulin Ratio: 2 (ref 1.2–2.2)
Albumin: 4.7 g/dL (ref 3.5–5.5)
BUN/Creatinine Ratio: 27 — ABNORMAL HIGH (ref 9–23)
BUN: 17 mg/dL (ref 6–20)
Bilirubin Total: 1.1 mg/dL (ref 0.0–1.2)
CALCIUM: 9.2 mg/dL (ref 8.7–10.2)
CO2: 21 mmol/L (ref 20–29)
CREATININE: 0.62 mg/dL (ref 0.57–1.00)
Chloride: 105 mmol/L (ref 96–106)
GFR calc Af Amer: 132 mL/min/{1.73_m2} (ref >59)
GFR calc non Af Amer: 115 mL/min/{1.73_m2} (ref >59)
GLUCOSE: 78 mg/dL (ref 65–99)
Globulin, Total: 2.3 g/dL (ref 1.5–4.5)
Potassium: 3.8 mmol/L (ref 3.5–5.2)
Sodium: 141 mmol/L (ref 134–144)
Total Protein: 7 g/dL (ref 6.0–8.5)

## 2017-03-05 LAB — LIPID PANEL
CHOLESTEROL TOTAL: 220 mg/dL — AB (ref 100–199)
Chol/HDL Ratio: 3.5 ratio (ref 0.0–4.4)
HDL: 63 mg/dL (ref >39)
LDL CALC: 141 mg/dL — AB (ref 0–99)
TRIGLYCERIDES: 81 mg/dL (ref 0–149)
VLDL CHOLESTEROL CAL: 16 mg/dL (ref 5–40)

## 2017-03-05 LAB — TSH: TSH: 1.22 u[IU]/mL (ref 0.450–4.500)

## 2017-03-05 NOTE — Progress Notes (Signed)
    Abigail Higgins 11/06/1980 [id number]        36 y.o.  G0P0  presents for Mirena IUD removal and replacement. She has read through the booklet, has no contraindications and signed the consent form. I reviewed the removal and insertional process with her as well as the risks to include infection, either immediate or long-term, uterine perforation or migration requiring surgery to remove, other complications such as pain, hormonal side effects, infertility and possibility of failure with subsequent pregnancy.   Exam with Kennon PortelaKim Gardner assistant Vitals:   03/05/17 1041  BP: 118/78    Pelvic: External BUS vagina normal. Cervix normal IUD string not visualized. Uterus anteverted normal size shape contour midline mobile nontender. Adnexa without masses or tenderness.  Procedure: The cervix was visualized with a speculum and the old IUD string was grasped within the endocervical canal with a [place] forcep, the IUD removed, shown to the patient and discarded.  The cervix was then cleansed with Betadine, anterior lip grasped with a single-tooth tenaculum, the uterus was sounded and a Mirena IUD was placed according to manufacturer's recommendations without difficulty. The strings were trimmed. The patient tolerated well and will follow up in one month for a postinsertional check.  Lot number:  [id number]    Dara Lordsimothy P Fontaine MD, 11:13 AM 03/05/2017

## 2017-03-05 NOTE — Patient Instructions (Signed)

## 2017-03-16 NOTE — Assessment & Plan Note (Addendum)
Mild today.  Asymptomatic.  Reassuring EKG.  Await lab results.  Okay to continue stimulant therapy.

## 2017-03-16 NOTE — Assessment & Plan Note (Signed)
Await lab results. 

## 2017-04-06 ENCOUNTER — Ambulatory Visit: Payer: PRIVATE HEALTH INSURANCE | Admitting: Gynecology

## 2017-04-06 ENCOUNTER — Encounter: Payer: Self-pay | Admitting: Gynecology

## 2017-04-06 VITALS — BP 118/76

## 2017-04-06 DIAGNOSIS — Z30431 Encounter for routine checking of intrauterine contraceptive device: Secondary | ICD-10-CM

## 2017-04-06 NOTE — Progress Notes (Signed)
    Abigail Higgins [birth year] [id number]        37 y.o.  G0P0 presents for IUD follow-up exam.  Had her Mirena IUD replaced 03/05/2017.  Has done well without issues of pain or bleeding.  Past medical history,surgical history, problem list, medications, allergies, family history and social history were all reviewed and documented in the EPIC chart.  Directed ROS with pertinent positives and negatives documented in the history of present illness/assessment and plan.  Exam: Kennon PortelaKim Gardner assistant Vitals:   04/06/17 1058  BP: 118/76   General appearance:  Normal Abdomen soft nontender without masses guarding rebound Pelvic external BUS vagina normal.  Cervix normal.  IUD string visualized and appropriate length.  Uterus normal size midline mobile nontender.  Adnexa without masses or tenderness.  Assessment/Plan:  37 y.o. G0P0 with normal IUD checkup.  Follow-up end of November when due for annual exam.  Sooner if any issues.    Dara Lordsimothy P Chukwuebuka Churchill MD, 11:08 AM 04/06/2017

## 2017-04-06 NOTE — Patient Instructions (Signed)
Follow-up in November for annual exam when you are due.

## 2017-04-29 ENCOUNTER — Ambulatory Visit: Payer: PRIVATE HEALTH INSURANCE | Admitting: Physician Assistant

## 2017-04-29 ENCOUNTER — Encounter: Payer: Self-pay | Admitting: Physician Assistant

## 2017-04-29 VITALS — BP 124/92 | HR 102 | Temp 98.5°F | Resp 18 | Ht 65.0 in | Wt 192.0 lb

## 2017-04-29 DIAGNOSIS — M7711 Lateral epicondylitis, right elbow: Secondary | ICD-10-CM

## 2017-04-29 DIAGNOSIS — R202 Paresthesia of skin: Secondary | ICD-10-CM | POA: Diagnosis not present

## 2017-04-29 NOTE — Progress Notes (Signed)
Subjective:    Patient ID: Abigail Higgins, female    DOB: January 02, 1981, 37 y.o.   MRN: [id number]  HPI Patient presents with bilateral elbow pain that has been present since November when she ran into a cinder block wall with the right side of her body. After walking into the wall, only the right elbow had pain. A month ago, the left elbow started hurting as well. 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 Excessive vacuuming exacerbates the pain in both elbows. Patient has tried taking Aleve, which alleviated the pain. Patient also reports weakness in her left arm when opening a heavy door. Patient reports episodes of numbness in her right forearm and hand upon waking up.  Patient Active Problem List   Diagnosis Date Noted  . Tachycardia 01/03/2017  . Migraine 01/24/2016  . Alcoholism in remission (HCC) 11/30/2014  . Eustachian tube dysfunction 09/29/2014  . Hyperlipidemia 06/26/2014  . Cigarette smoker one half pack a day or less 06/01/2014  . Rash 03/14/2013  . Other malaise and fatigue 03/14/2013  . IC (interstitial cystitis) 12/15/2012  . Adopted 05/18/2012  . Weight gain 05/18/2012  . Bipolar I disorder (HCC)   . CIN I (cervical intraepithelial neoplasia I)    No Known Allergies   Prior to Admission medications   Medication Sig Start Date End Date Taking? Authorizing Provider  ALPRAZolam (XANAX) 0.25 MG tablet Take 0.25 mg by mouth 4 (four) times daily as needed.    Yes [provider]  ARIPiprazole (ABILIFY) 10 MG tablet Take 10 mg by mouth daily.   Yes [provider]  clobetasol cream (TEMOVATE) 0.05 % Apply 1 application topically 2 (two) times daily as needed.   Yes [provider]  hydrocortisone (ANUSOL-HC) 25 MG suppository Place 1 suppository (25 mg total) rectally 2 (two) times daily. 03/31/16  Yes Jeffery, Avelino Leeds, PA-C  levonorgestrel (MIRENA) 20 MCG/24HR IUD 1 each by Intrauterine route once. Reported on 05/29/2015   Yes  [provider]  lisdexamfetamine (VYVANSE) 50 MG capsule Take 50 mg by mouth every morning.   Yes [provider]  oxybutynin (DITROPAN-XL) 10 MG 24 hr tablet Take 1 tablet by mouth daily. Reported on 05/29/2015 11/10/14  Yes [provider]  QUEtiapine (SEROQUEL XR) 50 MG TB24 24 hr tablet Take 4 tablets by mouth at bedtime.  09/11/15  Yes [provider]  traZODone (DESYREL) 150 MG tablet Take 150 mg by mouth at bedtime.   Yes [provider]  clonazePAM (KLONOPIN) 1 MG tablet Take 1 tablet by mouth daily. 09/26/15   [provider]  glycopyrrolate (ROBINUL) 1 MG tablet Take 1 tablet by mouth 2 (two) times daily. 09/20/15   [provider]    Past Medical History:  Diagnosis Date  . ADD (attention deficit disorder)   . Anxiety   . CIN I (cervical intraepithelial neoplasia I)   . Depression   . IC (interstitial cystitis)   . Mild acid reflux WATCHES DIET  . Urethral disorder URETHRAL SPRAYING   Social History   Socioeconomic History  . Marital status: Single    Spouse name: Not on file  . Number of children: Not on file  . Years of education: Not on file  . Highest education level: Not on file  Social Needs  . Financial resource strain: Not on file  . Food insecurity - worry: Not on file  . Food insecurity - inability: Not on file  . Transportation needs -  medical: Not on file  . Transportation needs - non-medical: Not on file  Occupational History  . Not on file  Tobacco Use  . Smoking status: Former Smoker    Packs/day: 0.50    Years: 7.00    Pack years: 3.50    Types: Cigarettes    Last attempt to quit: 01/16/2015    Years since quitting: 2.2  . Smokeless tobacco: Never Used  Substance and Sexual Activity  . Alcohol use: No    Alcohol/week: 0.0 oz  . Drug use: No  . Sexual activity: Yes    Birth control/protection: IUD, Condom    Comment: 1st intercourse- 13, partners- greater than 5 Mirena IUD 03/05/2017    Other Topics Concern  . 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 COLPOSCOPY    . CYSTOSCOPY WITH URETHRAL DILATATION N/A 08/02/2012   Procedure: CYSTOSCOPY WITH URETHRAL DILATATION WITH INSTILLATION OF PYRIDIUM AND PELVIC EXAM;  Surgeon: Kathi LudwigSigmund I Tannenbaum, MD;  [facility];  Service: Urology;  Laterality: N/A;  . INTRAUTERINE DEVICE INSERTION  03/05/2017   MIRENA  . RHINOPLASTY  AGE 49  . TONSILLECTOMY  AGE 69    Review of Systems  HENT: Negative.   Eyes: Negative.   Respiratory: Negative.   Cardiovascular: Negative.   Musculoskeletal: Positive for myalgias.       Bilateral elbow pain.  Neurological: Positive for weakness and numbness.      Objective:   Physical Exam  Constitutional: She is oriented to person, place, and time. She appears well-developed and well-nourished.  BP (!) 124/92   Pulse (!) 102   Temp 98.5 F (36.9 C) (Oral)   Resp 18   Ht 5\' 5"  (1.651 m)   Wt 192 lb (87.1 kg)   SpO2 96%   BMI 31.95 kg/m   HENT:  Head: Normocephalic and atraumatic.  Right Ear: External ear normal.  Left Ear: External ear normal.  Mouth/Throat: Oropharynx is clear and moist.  Eyes: Conjunctivae and EOM are normal. Pupils are equal, round, and reactive to light.  Neck: Normal range of motion. Neck supple.  Cardiovascular: Normal rate, regular rhythm, normal heart sounds and intact distal pulses.  Pulmonary/Chest: Effort normal and breath sounds normal.  Musculoskeletal: Normal range of motion. She exhibits tenderness.  Pain upon elbow flexion against resistance. Tenderness upon palpation of the lateral epicondyle.  Neurological: She is alert and oriented to person, place, and time.       Assessment & Plan:    1. Lateral epicondylitis of right elbow Patient is advised to take Aleve daily, q 4-6 hours for elbow pain. Additionally, patient was advised to use a elbow strap to support her elbow while at work.  2. Paresthesia of left arm This is likely due to sleeping position and compensatory actions taken by the patient to aid dominant (right) arm. Trying different sleeping positions and allowing some rest for the left arm are recommended at this time.  Return if symptoms worsen or fail to improve in the next 2 weeks.

## 2017-04-29 NOTE — Progress Notes (Deleted)
No chief complaint on file.   HPI  4 review of systems  Past Medical History:  Diagnosis Date  . ADD (attention deficit disorder)   . Anxiety   . CIN I (cervical intraepithelial neoplasia I)   . Depression   . IC (interstitial cystitis)   . Mild acid reflux WATCHES DIET  . Urethral disorder URETHRAL SPRAYING    Current Outpatient Medications  Medication Sig Dispense Refill  . ALPRAZolam (XANAX) 0.25 MG tablet Take 0.25 mg by mouth 4 (four) times daily as needed.     . ARIPiprazole (ABILIFY) 10 MG tablet Take 10 mg by mouth daily.    . clobetasol cream (TEMOVATE) 0.05 % Apply 1 application topically 2 (two) times daily as needed.    . clonazePAM (KLONOPIN) 1 MG tablet Take 1 tablet by mouth daily.  3  . glycopyrrolate (ROBINUL) 1 MG tablet Take 1 tablet by mouth 2 (two) times daily.  2  . hydrocortisone (ANUSOL-HC) 25 MG suppository Place 1 suppository (25 mg total) rectally 2 (two) times daily. 12 suppository 1  . levonorgestrel (MIRENA) 20 MCG/24HR IUD 1 each by Intrauterine route once. Reported on 05/29/2015    . lisdexamfetamine (VYVANSE) 50 MG capsule Take 50 mg by mouth every morning.    Marland Kitchen oxybutynin (DITROPAN-XL) 10 MG 24 hr tablet Take 1 tablet by mouth daily. Reported on 05/29/2015  0  . QUEtiapine (SEROQUEL XR) 50 MG TB24 24 hr tablet Take 4 tablets by mouth at bedtime.   2  . traZODone (DESYREL) 150 MG tablet Take 150 mg by mouth at bedtime.     No current facility-administered medications for this visit.     Allergies: No Known Allergies  Past Surgical History:  Procedure Laterality Date  . COLPOSCOPY    . CYSTOSCOPY WITH URETHRAL DILATATION N/A 08/02/2012   Procedure: CYSTOSCOPY WITH URETHRAL DILATATION WITH INSTILLATION OF PYRIDIUM AND PELVIC EXAM;  Surgeon: Kathi Ludwig, MD;  Location: [facility];  Service: Urology;  Laterality: N/A;  . INTRAUTERINE DEVICE INSERTION  03/05/2017   MIRENA  . RHINOPLASTY  AGE 41  . TONSILLECTOMY  AGE 12      Social History   Socioeconomic History  . Marital status: Single    Spouse name: Not on file  . Number of children: Not on file  . Years of education: Not on file  . Highest education level: Not on file  Social Needs  . Financial resource strain: Not on file  . Food insecurity - worry: Not on file  . Food insecurity - inability: Not on file  . Transportation needs - medical: Not on file  . Transportation needs - non-medical: Not on file  Occupational History  . Not on file  Tobacco Use  . Smoking status: Former Smoker    Packs/day: 0.50    Years: 7.00    Pack years: 3.50    Types: Cigarettes    Last attempt to quit: 01/16/2015    Years since quitting: 2.2  . Smokeless tobacco: Never Used  Substance and Sexual Activity  . Alcohol use: No    Alcohol/week: 0.0 oz  . Drug use: No  . Sexual activity: Yes    Birth control/protection: IUD, Condom    Comment: 1st intercourse- 13, partners- greater than 5 Mirena IUD 03/05/2017  Other Topics Concern  . Not on file  Social History Narrative   Significant other - Rocky Crafts   Works at [employer] 2.5 years and [employer] for  4-5 years   Previous hx of substance abuse     Family History  Adopted: Yes     ROS Review of Systems See HPI Constitution: No fevers or chills No malaise No diaphoresis Skin: No rash or itching Eyes: no blurry vision, no double vision GU: no dysuria or hematuria Neuro: no dizziness or headaches * all others reviewed and negative   Objective: There were no vitals filed for this visit.  Physical Exam  Assessment and Plan There are no diagnoses linked to this encounter.   Kiing Deakin P [employer]

## 2017-04-29 NOTE — Progress Notes (Signed)
Patient ID: Abigail Higgins, female    DOB: 10-24-1980, [age] y.o.   MRN: [id number]  PCP: Porfirio OarJeffery, Mi Balla, PA-C  Chief Complaint  Patient presents with  . Elbow Pain    Bilateral, Hit concrete wall in September  . Extremity Weakness    Left arm    Subjective:   Presents for evaluation of bilateral elbow pain and left arm weakness.  In September, she bumped into a concrete wall, injuring her RIGHT elbow. Aleve in the mornings (PRN) helps.  Episodic numbness in the right forearm and hand when she first awakens in the mornings.  LEFT arm weakness and intermittent LEFT elbow pain x 4 weeks.  Noticed this when trying to open a very heavy door.  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   Review of Systems     Patient Active Problem List   Diagnosis Date Noted  . Tachycardia 01/03/2017  . Migraine 01/24/2016  . Alcoholism in remission (HCC) 11/30/2014  . Eustachian tube dysfunction 09/29/2014  . Hyperlipidemia 06/26/2014  . Cigarette smoker one half pack a day or less 06/01/2014  . Rash 03/14/2013  . Other malaise and fatigue 03/14/2013  . IC (interstitial cystitis) 12/15/2012  . Adopted 05/18/2012  . Weight gain 05/18/2012  . Bipolar I disorder (HCC)   . CIN I (cervical intraepithelial neoplasia I)      Prior to Admission medications   Medication Sig Start Date End Date Taking? Authorizing Provider  ALPRAZolam (XANAX) 0.25 MG tablet Take 0.25 mg by mouth 4 (four) times daily as needed.    Yes [provider]  ARIPiprazole (ABILIFY) 10 MG tablet Take 10 mg by mouth daily.   Yes [provider]  clobetasol cream (TEMOVATE) 0.05 % Apply 1 application topically 2 (two) times daily as needed.   Yes [provider]  hydrocortisone (ANUSOL-HC) 25 MG suppository Place 1 suppository (25 mg total) rectally 2 (two) times daily. 03/31/16  Yes Massimiliano Rohleder, Avelino Leedshelle, PA-C  levonorgestrel (MIRENA) 20 MCG/24HR  IUD 1 each by Intrauterine route once. Reported on 05/29/2015   Yes [provider]  lisdexamfetamine (VYVANSE) 50 MG capsule Take 50 mg by mouth every morning.   Yes [provider]  oxybutynin (DITROPAN-XL) 10 MG 24 hr tablet Take 1 tablet by mouth daily. Reported on 05/29/2015 11/10/14  Yes [provider]  QUEtiapine (SEROQUEL XR) 50 MG TB24 24 hr tablet Take 4 tablets by mouth at bedtime.  09/11/15  Yes [provider]  traZODone (DESYREL) 150 MG tablet Take 150 mg by mouth at bedtime.   Yes [provider]  clonazePAM (KLONOPIN) 1 MG tablet Take 1 tablet by mouth daily. 09/26/15   [provider]  glycopyrrolate (ROBINUL) 1 MG tablet Take 1 tablet by mouth 2 (two) times daily. 09/20/15   [provider]     No Known Allergies     Objective:  Physical Exam  Constitutional: She is oriented to person, place, and time. She appears well-developed and well-nourished. She is active and cooperative. No distress.  BP (!) 124/92   Pulse (!) 102   Temp 98.5 F (36.9 C) (Oral)   Resp 18   Ht 5\' 5"  (1.651 m)   Wt 192 lb (87.1 kg)   SpO2 96%   BMI 31.95 kg/m    Eyes: Conjunctivae are normal.  Pulmonary/Chest: Effort normal.  Musculoskeletal:       Right shoulder: Normal.  Left shoulder: Normal.       Right elbow: She exhibits normal range of motion, no swelling, no effusion, no deformity and no laceration. Tenderness found. Lateral epicondyle tenderness noted. No radial head, no medial epicondyle and no olecranon process tenderness noted.       Left elbow: Normal.       Right wrist: Normal.       Left wrist: Normal.       Cervical back: Normal.       Right upper arm: Normal.       Left upper arm: Normal.       Right forearm: Normal.       Left forearm: Normal.       Right hand: Normal.       Left hand: Normal.  Neurological: She is alert and oriented to person, place, and time.  Psychiatric: She has a normal mood and  affect. Her speech is normal and behavior is normal.           Assessment & Plan:   1. Lateral epicondylitis of right elbow 2. Paresthesia of left arm Recommend elbow strap for the RIGHT. She can purchase this over-the-counter.  I suspect that the paresthesias and pain of the left arm are due to overuse, in compensation for self-limiting her activities with the right arm.  Continue Aleve as needed.  Ice massage.    Return if symptoms worsen or fail to improve in the next 2 weeks.   Fernande Bras, PA-C Primary Care at [facility]

## 2017-04-29 NOTE — Patient Instructions (Addendum)
Take the Aleve twice a day with food for the next 2 weeks. Get an ELBOW STRAP at your local pharmacy.   IF you received an x-ray today, you will receive an invoice from [facility]. Please contact [facility] at [phone] with questions or concerns regarding your invoice.   IF you received labwork today, you will receive an invoice from [facility]. Please contact [facility] at [phone] with questions or concerns regarding your invoice.   Our billing staff will not be able to assist you with questions regarding bills from these companies.  You will be contacted with the lab results as soon as they are available. The fastest way to get your results is to activate your My Chart account. Instructions are located on the last page of this paperwork. If you have not heard from us regarding the results in 2 weeks, please contact this office.

## 2017-05-04 ENCOUNTER — Ambulatory Visit: Payer: PRIVATE HEALTH INSURANCE | Admitting: Physician Assistant

## 2017-05-04 ENCOUNTER — Other Ambulatory Visit: Payer: Self-pay

## 2017-05-04 ENCOUNTER — Encounter: Payer: Self-pay | Admitting: Physician Assistant

## 2017-05-04 VITALS — BP 124/90 | HR 86 | Temp 97.7°F | Ht 66.0 in | Wt 191.2 lb

## 2017-05-04 DIAGNOSIS — R82998 Other abnormal findings in urine: Secondary | ICD-10-CM

## 2017-05-04 DIAGNOSIS — T3695XA Adverse effect of unspecified systemic antibiotic, initial encounter: Secondary | ICD-10-CM | POA: Diagnosis not present

## 2017-05-04 DIAGNOSIS — R300 Dysuria: Secondary | ICD-10-CM

## 2017-05-04 DIAGNOSIS — B379 Candidiasis, unspecified: Secondary | ICD-10-CM

## 2017-05-04 LAB — POCT URINALYSIS DIP (MANUAL ENTRY)
Bilirubin, UA: NEGATIVE
Glucose, UA: NEGATIVE mg/dL
Ketones, POC UA: NEGATIVE mg/dL
NITRITE UA: POSITIVE — AB
PROTEIN UA: NEGATIVE mg/dL
SPEC GRAV UA: 1.02 (ref 1.010–1.025)
UROBILINOGEN UA: 0.2 U/dL
pH, UA: 6 (ref 5.0–8.0)

## 2017-05-04 MED ORDER — FLUCONAZOLE 150 MG PO TABS: 150.0000 mg | ORAL_TABLET | Freq: Once | ORAL | 0 refills | Status: AC

## 2017-05-04 MED ORDER — NITROFURANTOIN MONOHYD MACRO 100 MG PO CAPS: 100.0000 mg | ORAL_CAPSULE | Freq: Two times a day (BID) | ORAL | 0 refills | Status: AC

## 2017-05-04 NOTE — Progress Notes (Signed)
05/04/2017 at 3:26 PM  Abigail Higgins / DOB: 01-27-1981 / MRN: [id number]  The patient has Bipolar I disorder (HCC); CIN I (cervical intraepithelial neoplasia I); Adopted; Weight gain; IC (interstitial cystitis); Rash; Other malaise and fatigue; Cigarette smoker one half pack a day or less; Hyperlipidemia; Eustachian tube dysfunction; Alcoholism in remission (HCC); Migraine; and Tachycardia on their problem list.  SUBJECTIVE  Abigail Higgins is a 37 y.o. female who complains of dysuria, urinary frequency and suprapubic pressure  x 3 days. She denies hematuria, cloudy malordorous urine, genital rash, genital irritation and vaginal discharge. Has tried azo and aleve with some relief. She is sexually active with monogamous partner. No concern for STD. Has PMH of interstitial cystitis. Last saw urology 2 months ago. Notes these current sx feel different/worse than her typical sx of interstitial cystitis. She has changed her diet and that has really helped with IC.   She  has a past medical history of ADD (attention deficit disorder), Anxiety, CIN I (cervical intraepithelial neoplasia I), Depression, IC (interstitial cystitis), Mild acid reflux (WATCHES DIET), and Urethral disorder (URETHRAL SPRAYING).    Medications reviewed and updated by myself where necessary, and exist elsewhere in the encounter.   Ms. Worrall has No Known Allergies. She  reports that she quit smoking about 2 years ago. Her smoking use included cigarettes. She has a 3.50 pack-year smoking history. she has never used smokeless tobacco. She reports that she does not drink alcohol or use drugs. She  reports that she currently engages in sexual activity. She reports using the following methods of birth control/protection: IUD and Condom. The patient  has a past surgical history that includes Rhinoplasty (AGE 85); Colposcopy; Intrauterine device insertion (03/05/2017); Tonsillectomy (AGE 50); and Cystoscopy with urethral dilatation  (N/A, 08/02/2012).  Her family history is not on file. She was adopted.  Review of Systems  Constitutional: Negative for chills, diaphoresis and fever.  Gastrointestinal: Negative for nausea and vomiting.  Genitourinary: Negative for flank pain.    OBJECTIVE  Her  height is 5\' 6"  (1.676 m) and weight is 191 lb 3.2 oz (86.7 kg). Her oral temperature is 97.7 F (36.5 C). Her blood pressure is 124/90 and her pulse is 86. Her oxygen saturation is 97%.  The patient's body mass index is 30.86 kg/m.  Physical Exam  Constitutional: She is oriented to person, place, and time. She appears well-developed and well-nourished. No distress.  HENT:  Head: Normocephalic and atraumatic.  Eyes: Conjunctivae are normal.  Neck: Normal range of motion.  Pulmonary/Chest: Effort normal.  Abdominal: Soft. Normal appearance. There is tenderness (mild) in the suprapubic area. There is no CVA tenderness.  Neurological: She is alert and oriented to person, place, and time.  Skin: Skin is warm and dry.  Psychiatric: She has a normal mood and affect.  Vitals reviewed.   Results for orders placed or performed in visit on 05/04/17 (from the past 24 hour(s))  POCT urinalysis dipstick     Status: Abnormal   Collection Time: 05/04/17  3:04 PM  Result Value Ref Range   Color, UA yellow yellow   Clarity, UA cloudy (A) clear   Glucose, UA negative negative mg/dL   Bilirubin, UA negative negative   Ketones, POC UA negative negative mg/dL   Spec Grav, UA 1.324 4.010 - 1.025   Blood, UA trace-lysed (A) negative   pH, UA 6.0 5.0 - 8.0   Protein Ur, POC negative negative mg/dL   Urobilinogen, UA 0.2  0.2 or 1.0 E.U./dL   Nitrite, UA Positive (A) Negative   Leukocytes, UA Large (3+) (A) Negative    ASSESSMENT & PLAN  Karie Kirksileen was seen today for urination burning.  Diagnoses and all orders for this visit:  Dysuria -     POCT urinalysis dipstick -     Urinalysis, microscopic only  Urine leukocytes -     Urine  Culture -     nitrofurantoin, macrocrystal-monohydrate, (MACROBID) 100 MG capsule; Take 1 capsule (100 mg total) by mouth 2 (two) times daily for 5 days.  Antibiotic-induced yeast infection -     fluconazole (DIFLUCAN) 150 MG tablet; Take 1 tablet (150 mg total) by mouth once for 1 dose. Repeat if needed   History, physical examination, and UA consistent with UTI.  Patient does have history of interstitial cystitis.  Urine culture pending.  If urine culture is normal,  plan to follow-up with urology.The patient was advised to call or come back to clinic if she does not see an improvement in symptoms, or worsens with the above plan.   Benjiman CoreBrittany Buford Bremer, PA-C  Primary Care at [facility] 05/04/2017 3:26 PM

## 2017-05-04 NOTE — Patient Instructions (Addendum)
  Your results indicate you have a UTI. I have given you a prescription for an antibiotic. Please take with food. I have sent off a urine culture and we should have those results in 48 hours. If your symptoms worsen while you are awaiting these results or you develop fever, chills, flank pian, nausea and vomiting, please seek care immediately.    Urinary Tract Infection, Adult A urinary tract infection (UTI) is an infection of any part of the urinary tract. The urinary tract includes the:  Kidneys.  Ureters.  Bladder.  Urethra.  These organs make, store, and get rid of pee (urine) in the body. Follow these instructions at home:  Take over-the-counter and prescription medicines only as told by your doctor.  If you were prescribed an antibiotic medicine, take it as told by your doctor. Do not stop taking the antibiotic even if you start to feel better.  Avoid the following drinks: ? Alcohol. ? Caffeine. ? Tea. ? Carbonated drinks.  Drink enough fluid to keep your pee clear or pale yellow.  Keep all follow-up visits as told by your doctor. This is important.  Make sure to: ? Empty your bladder often and completely. Do not to hold pee for long periods of time. ? Empty your bladder before and after sex. ? Wipe from front to back after a bowel movement if you are female. Use each tissue one time when you wipe. Contact a doctor if:  You have back pain.  You have a fever.  You feel sick to your stomach (nauseous).  You throw up (vomit).  Your symptoms do not get better after 3 days.  Your symptoms go away and then come back. Get help right away if:  You have very bad back pain.  You have very bad lower belly (abdominal) pain.  You are throwing up and cannot keep down any medicines or water. This information is not intended to replace advice given to you by your health care provider. Make sure you discuss any questions you have with your health care provider. Document  Released: 08/20/2007 Document Revised: 08/09/2015 Document Reviewed: 01/22/2015 Elsevier Interactive Patient Education  2018 [employer]    IF you received an x-ray today, you will receive an invoice from [facility]. Please contact [facility] at [phone] with questions or concerns regarding your invoice.   IF you received labwork today, you will receive an invoice from [facility]. Please contact [facility] at [phone] with questions or concerns regarding your invoice.   Our billing staff will not be able to assist you with questions regarding bills from these companies.  You will be contacted with the lab results as soon as they are available. The fastest way to get your results is to activate your My Chart account. Instructions are located on the last page of this paperwork. If you have not heard from us regarding the results in 2 weeks, please contact this office.     

## 2017-05-06 LAB — URINE CULTURE

## 2017-05-06 LAB — URINALYSIS, MICROSCOPIC ONLY
CASTS: NONE SEEN /LPF
WBC, UA: >30 /hpf — AB (ref 0–?)

## 2017-06-11 ENCOUNTER — Encounter: Payer: Self-pay | Admitting: Gynecology

## 2017-06-11 ENCOUNTER — Ambulatory Visit: Payer: PRIVATE HEALTH INSURANCE | Admitting: Gynecology

## 2017-06-11 VITALS — BP 130/80

## 2017-06-11 DIAGNOSIS — N898 Other specified noninflammatory disorders of vagina: Secondary | ICD-10-CM | POA: Diagnosis not present

## 2017-06-11 DIAGNOSIS — R300 Dysuria: Secondary | ICD-10-CM

## 2017-06-11 MED ORDER — METRONIDAZOLE 500 MG PO TABS: 500.0000 mg | ORAL_TABLET | Freq: Two times a day (BID) | ORAL | 0 refills | Status: DC

## 2017-06-11 MED ORDER — FLUCONAZOLE 150 MG PO TABS: 150.0000 mg | ORAL_TABLET | Freq: Once | ORAL | 0 refills | Status: AC

## 2017-06-11 NOTE — Patient Instructions (Signed)
Take the Flagyl medication twice daily for 7 days.  Avoid alcohol while taking. Take the one Diflucan pill to cover for yeast.  Follow-up if your symptoms persist, worsen or recur.

## 2017-06-11 NOTE — Progress Notes (Signed)
    Clinton Gallantileen E Gianino 01/23/1981 [id number]        37 y.o.  G0P0 since complaining of vaginal discharge and vaginal itching.  No significant odor.  Some mild dysuria but no frequency urgency low back pain fever or chills.  Was recently treated for UTI in February with Macrobid.  No diarrhea constipation nausea vomiting.  Past medical history,surgical history, problem list, medications, allergies, family history and social history were all reviewed and documented in the EPIC chart.  Directed ROS with pertinent positives and negatives documented in the history of present illness/assessment and plan.  Exam: Kennon PortelaKim Gardner assistant Vitals:   06/11/17 1604  BP: 130/80   General appearance:  Normal Spine straight without CVA tenderness Abdomen soft nontender without masses guarding rebound Pelvic external BUS vagina with white discharge.  Cervix normal.  Uterus normal size midline mobile nontender.  Adnexa without masses or tenderness.    Assessment/Plan:  37 y.o. G0P0 with history as above.  Wet prep and urine analysis are both negative.  I suspect she has a low-grade bacterial vaginosis causing her symptoms to include the mild dysuria.  Will cover with Flagyl 500 mg twice daily times 7 days, alcohol avoidance reviewed.  Will also provide Diflucan 150 mg x1 dose to prevent yeast overgrowth with this antibiotic treatment.  Patient will follow-up if her symptoms persist, worsen or recur.    Dara Lordsimothy P Ramari Bray MD, 4:27 PM 06/11/2017

## 2017-06-12 LAB — WET PREP FOR TRICH, YEAST, CLUE

## 2017-06-12 NOTE — Addendum Note (Signed)
Addended by: Dayna BarkerGARDNER, KIMBERLY K on: 06/12/2017 08:40 AM   Modules accepted: Orders

## 2017-06-14 LAB — NO CULTURE INDICATED

## 2017-06-14 LAB — URINALYSIS, COMPLETE W/RFL CULTURE
BACTERIA UA: NONE SEEN /HPF
Bilirubin Urine: NEGATIVE
Glucose, UA: NEGATIVE
HYALINE CAST: NONE SEEN /LPF
Hgb urine dipstick: NEGATIVE
Ketones, ur: NEGATIVE
LEUKOCYTE ESTERASE: NEGATIVE
Nitrites, Initial: NEGATIVE
PROTEIN: NEGATIVE
RBC / HPF: NONE SEEN /HPF (ref 0–2)
Specific Gravity, Urine: 1.01 (ref 1.001–1.03)
WBC, UA: NONE SEEN /HPF (ref 0–5)
pH: 7 (ref 5.0–8.0)

## 2017-06-18 ENCOUNTER — Encounter: Payer: Self-pay | Admitting: Physician Assistant

## 2017-07-01 ENCOUNTER — Ambulatory Visit: Payer: PRIVATE HEALTH INSURANCE | Admitting: Physician Assistant

## 2017-07-01 ENCOUNTER — Other Ambulatory Visit: Payer: Self-pay

## 2017-07-01 ENCOUNTER — Encounter: Payer: Self-pay | Admitting: Physician Assistant

## 2017-07-01 DIAGNOSIS — K649 Unspecified hemorrhoids: Secondary | ICD-10-CM | POA: Diagnosis not present

## 2017-07-01 MED ORDER — HYDROCORTISONE ACETATE 25 MG RE SUPP: 25.0000 mg | Freq: Two times a day (BID) | RECTAL | 1 refills | Status: DC

## 2017-07-01 NOTE — Progress Notes (Signed)
Subjective:    Patient ID: Abigail Higgins, female    DOB: Dec 24, 1980, 37 y.o.   MRN: [id number] Chief Complaint  Patient presents with  . Hemorrhoids    going on for past 2-3 weeks, getting worse    HPI  37 yo female presents for evaluation of hemorrhoids. Last year patient was seen for hemorrhoids and was given a suppository prescription. Medications have expired.   For the past 2-3 weeks pain has radiated out from her anus a few inches. Most concerned with itching, burning and pain in her lower half of her vagina (posterior). Reports constant itching and burning. Pain increased with urination, bowel movement. Noticed blood when wiping, but none in her stool. Has taken preparation H suppository without any relief.  Pain and itching are so intolerant that they are interfering with work.   Denies hematochezia or melena. Denies dizziness, light headedness.   Review of Systems  Constitutional: Negative for activity change, appetite change, chills, diaphoresis and fever.  HENT: Negative.   Eyes: Negative.   Respiratory: Negative.   Cardiovascular: Negative for chest pain and palpitations.  Gastrointestinal: Positive for anal bleeding, nausea and rectal pain. Negative for abdominal distention, abdominal pain, blood in stool, constipation, diarrhea and vomiting.  Endocrine: Negative.   Genitourinary: Positive for dyspareunia and dysuria. Negative for difficulty urinating, enuresis, flank pain, frequency, hematuria, vaginal bleeding, vaginal discharge and vaginal pain.  Musculoskeletal: Negative.   Skin: Negative.   Allergic/Immunologic: Negative.   Neurological: Negative for dizziness, syncope, weakness, light-headedness, numbness and headaches.  Hematological: Negative.     Patient Active Problem List   Diagnosis Date Noted  . Tachycardia 01/03/2017  . Migraine 01/24/2016  . Alcoholism in remission (HCC) 11/30/2014  . Eustachian tube dysfunction 09/29/2014  . Hyperlipidemia  06/26/2014  . Cigarette smoker one half pack a day or less 06/01/2014  . Rash 03/14/2013  . Other malaise and fatigue 03/14/2013  . IC (interstitial cystitis) 12/15/2012  . Adopted 05/18/2012  . Weight gain 05/18/2012  . Bipolar I disorder (HCC)   . CIN I (cervical intraepithelial neoplasia I)    Past Medical History:  Diagnosis Date  . ADD (attention deficit disorder)   . Anxiety   . CIN I (cervical intraepithelial neoplasia I)   . Depression   . IC (interstitial cystitis)   . Mild acid reflux WATCHES DIET  . Urethral disorder URETHRAL SPRAYING   Prior to Admission medications   Medication Sig Start Date End Date Taking? Authorizing Provider  ALPRAZolam (XANAX) 0.25 MG tablet Take 0.25 mg by mouth 4 (four) times daily as needed.    Yes [provider]  ARIPiprazole (ABILIFY) 10 MG tablet Take 10 mg by mouth daily.   Yes [provider]  clobetasol cream (TEMOVATE) 0.05 % Apply 1 application topically 2 (two) times daily as needed.   Yes [provider]  glycopyrrolate (ROBINUL) 1 MG tablet Take 1 tablet by mouth 2 (two) times daily. 09/20/15  Yes [provider]  hydrocortisone (ANUSOL-HC) 25 MG suppository Place 1 suppository (25 mg total) rectally 2 (two) times daily. 03/31/16  Yes Jeffery, Avelino Leedshelle, PA-C  levonorgestrel (MIRENA) 20 MCG/24HR IUD 1 each by Intrauterine route once. Reported on 05/29/2015   Yes [provider]  lisdexamfetamine (VYVANSE) 50 MG capsule Take 50 mg by mouth every morning.   Yes [provider]  metroNIDAZOLE (FLAGYL) 500 MG tablet Take 1 tablet (500 mg total) by mouth 2 (two) times daily. For 7 days.  Avoid alcohol  while taking 06/11/17  Yes Fontaine, Nadyne Coombes, MD  oxybutynin (DITROPAN-XL) 10 MG 24 hr tablet Take 1 tablet by mouth daily. Reported on 05/29/2015 11/10/14  Yes [provider]  QUEtiapine (SEROQUEL XR) 50 MG TB24 24 hr tablet Take 4 tablets by mouth at bedtime.  09/11/15  Yes [provider]  traZODone (DESYREL) 150 MG tablet Take 150 mg by mouth at bedtime.   Yes [provider]   No Known Allergies       Objective:   Physical Exam  Constitutional: She is oriented to person, place, and time. She appears well-developed and well-nourished. No distress.  BP 122/82   Pulse (!) 115   Temp 99.1 F (37.3 C)   Resp 16   Ht 5\' 6"  (1.676 m)   Wt 189 lb (85.7 kg)   SpO2 98%   BMI 30.51 kg/m    HENT:  Head: Normocephalic and atraumatic.  Eyes: Pupils are equal, round, and reactive to light. Conjunctivae and EOM are normal. No scleral icterus.  Neck: Normal range of motion.  Cardiovascular: Normal rate, regular rhythm, normal heart sounds and intact distal pulses. Exam reveals no gallop and no friction rub.  No murmur heard. Pulmonary/Chest: Effort normal and breath sounds normal.  Genitourinary: Vagina normal.     Neurological: She is alert and oriented to person, place, and time.  Skin: Skin is warm and dry. She is not diaphoretic. No erythema.  Psychiatric: She has a normal mood and affect. Her behavior is normal.      Assessment & Plan:  1. Bleeding hemorrhoid Try hydrocortisone suppository, f/u if symptoms do not resolve or worsen. - hydrocortisone (ANUSOL-HC) 25 MG suppository; Place 1 suppository (25 mg total) rectally 2 (two) times daily.  Dispense: 12 suppository; Refill: 1

## 2017-07-01 NOTE — Progress Notes (Signed)
Patient ID: Abigail Higgins, female    DOB: October 23, 1980, 37 y.o.   MRN: [id number]  PCP: Porfirio Oar, PA-C  Chief Complaint  Patient presents with  . Hemorrhoids    going on for past 2-3 weeks, getting worse    Subjective:   Presents for evaluation of hemorrhoids.  I saw her for similar symptoms 03/31/2016. At that time, she was noted to have hemorrhoidal skin tags and was prescribed steroid suppositories with good relief. Her symptoms recurred several weeks ago, and the suppositories have expired, so she did not use them.  She reports progressively worsening pain at the rectum, that is spreading out anteriorly toward the vaginal opening, itching and burning and BRB on the toilet tissue when wiping after BM. No melena or hematochezia. Pain is worse with urination and defecation, and is now bad enough that sitting to work is intolerable.  No improvement with Preparation H.   Review of Systems Constitutional: Negative for activity change, appetite change, chills, diaphoresis and fever.  HENT: Negative.   Eyes: Negative.   Respiratory: Negative.   Cardiovascular: Negative for chest pain and palpitations.  Gastrointestinal: Positive for anal bleeding, nausea and rectal pain. Negative for abdominal distention, abdominal pain, blood in stool, constipation, diarrhea and vomiting.  Endocrine: Negative.   Genitourinary: Positive for dyspareunia and dysuria. Negative for difficulty urinating, enuresis, flank pain, frequency, hematuria, vaginal bleeding, vaginal discharge and vaginal pain.  Musculoskeletal: Negative.   Skin: Negative.   Allergic/Immunologic: Negative.   Neurological: Negative for dizziness, syncope, weakness, light-headedness, numbness and headaches.  Hematological: Negative.        Patient Active Problem List   Diagnosis Date Noted  . Tachycardia 01/03/2017  . Migraine 01/24/2016  . Alcoholism in remission (HCC) 11/30/2014  . Eustachian tube dysfunction  09/29/2014  . Hyperlipidemia 06/26/2014  . Cigarette smoker one half pack a day or less 06/01/2014  . Rash 03/14/2013  . Other malaise and fatigue 03/14/2013  . IC (interstitial cystitis) 12/15/2012  . Adopted 05/18/2012  . Weight gain 05/18/2012  . Bipolar I disorder (HCC)   . CIN I (cervical intraepithelial neoplasia I)      Prior to Admission medications   Medication Sig Start Date End Date Taking? Authorizing Provider  ALPRAZolam (XANAX) 0.25 MG tablet Take 0.25 mg by mouth 4 (four) times daily as needed.    Yes [provider]  ARIPiprazole (ABILIFY) 10 MG tablet Take 10 mg by mouth daily.   Yes [provider]  clobetasol cream (TEMOVATE) 0.05 % Apply 1 application topically 2 (two) times daily as needed.   Yes [provider]  glycopyrrolate (ROBINUL) 1 MG tablet Take 1 tablet by mouth 2 (two) times daily. 09/20/15  Yes [provider]  hydrocortisone (ANUSOL-HC) 25 MG suppository Place 1 suppository (25 mg total) rectally 2 (two) times daily. 03/31/16  Yes Rhodie Cienfuegos, Avelino Leeds, PA-C  levonorgestrel (MIRENA) 20 MCG/24HR IUD 1 each by Intrauterine route once. Reported on 05/29/2015   Yes [provider]  lisdexamfetamine (VYVANSE) 50 MG capsule Take 50 mg by mouth every morning.   Yes [provider]  metroNIDAZOLE (FLAGYL) 500 MG tablet Take 1 tablet (500 mg total) by mouth 2 (two) times daily. For 7 days.  Avoid alcohol while taking 06/11/17  Yes Fontaine, Nadyne Coombes, MD  oxybutynin (DITROPAN-XL) 10 MG 24 hr tablet Take 1 tablet by mouth daily. Reported on 05/29/2015 11/10/14  Yes [provider]  QUEtiapine (SEROQUEL XR) 50 MG TB24 24 hr  tablet Take 4 tablets by mouth at bedtime.  09/11/15  Yes [provider]  traZODone (DESYREL) 150 MG tablet Take 150 mg by mouth at bedtime.   Yes [provider]  clonazePAM (KLONOPIN) 1 MG tablet Take 1 tablet by mouth daily. 09/26/15   [provider]     No Known  Allergies     Objective:  Physical Exam  Constitutional: She is oriented to person, place, and time. She appears well-developed and well-nourished. She is active and cooperative. No distress.  BP 122/82   Pulse (!) 115   Temp 99.1 F (37.3 C)   Resp 16   Ht 5\' 6"  (1.676 m)   Wt 189 lb (85.7 kg)   SpO2 98%   BMI 30.51 kg/m    Eyes: Conjunctivae are normal.  Pulmonary/Chest: Effort normal.  Genitourinary:     Neurological: She is alert and oriented to person, place, and time.  Psychiatric: She has a normal mood and affect. Her speech is normal and behavior is normal.        Assessment & Plan:   1. Bleeding hemorrhoid Supportive care.  Anticipatory guidance.  RTC if symptoms worsen/persist. - hydrocortisone (ANUSOL-HC) 25 MG suppository; Place 1 suppository (25 mg total) rectally 2 (two) times daily.  Dispense: 12 suppository; Refill: 1    Return if symptoms worsen or fail to improve.   Fernande Brashelle S. Kimla Furth, PA-C Primary Care at [facility]

## 2017-07-01 NOTE — Patient Instructions (Signed)
     IF you received an x-ray today, you will receive an invoice from [facility]. Please contact [facility] at [phone] with questions or concerns regarding your invoice.   IF you received labwork today, you will receive an invoice from [facility]. Please contact [facility] at [phone] with questions or concerns regarding your invoice.   Our billing staff will not be able to assist you with questions regarding bills from these companies.  You will be contacted with the lab results as soon as they are available. The fastest way to get your results is to activate your My Chart account. Instructions are located on the last page of this paperwork. If you have not heard from us regarding the results in 2 weeks, please contact this office.     

## 2017-07-13 ENCOUNTER — Ambulatory Visit: Payer: PRIVATE HEALTH INSURANCE | Admitting: Gynecology

## 2017-07-13 ENCOUNTER — Encounter: Payer: Self-pay | Admitting: Gynecology

## 2017-07-13 VITALS — BP 118/76

## 2017-07-13 DIAGNOSIS — N761 Subacute and chronic vaginitis: Secondary | ICD-10-CM

## 2017-07-13 DIAGNOSIS — Z113 Encounter for screening for infections with a predominantly sexual mode of transmission: Secondary | ICD-10-CM

## 2017-07-13 LAB — URINALYSIS, COMPLETE W/RFL CULTURE
Bilirubin Urine: NEGATIVE
GLUCOSE, UA: NEGATIVE
HGB URINE DIPSTICK: NEGATIVE
Hyaline Cast: NONE SEEN /LPF
Ketones, ur: NEGATIVE
LEUKOCYTE ESTERASE: NEGATIVE
NITRITES URINE, INITIAL: NEGATIVE
PH: 7 (ref 5.0–8.0)
Protein, ur: NEGATIVE
RBC / HPF: NONE SEEN /HPF (ref 0–2)
Specific Gravity, Urine: 1.015 (ref 1.001–1.03)

## 2017-07-13 LAB — C. TRACHOMATIS/N. GONORRHOEAE RNA
C. TRACHOMATIS RNA, TMA: NOT DETECTED
N. gonorrhoeae RNA, TMA: NOT DETECTED

## 2017-07-13 LAB — WET PREP FOR TRICH, YEAST, CLUE

## 2017-07-13 MED ORDER — NYSTATIN-TRIAMCINOLONE 100000-0.1 UNIT/GM-% EX OINT: 1.0000 "application " | TOPICAL_OINTMENT | Freq: Two times a day (BID) | CUTANEOUS | 0 refills | Status: DC

## 2017-07-13 MED ORDER — FLUCONAZOLE 200 MG PO TABS: 200.0000 mg | ORAL_TABLET | Freq: Every day | ORAL | 0 refills | Status: DC

## 2017-07-13 NOTE — Progress Notes (Signed)
    Abigail Higgins 07/01/1980 [id number]        37 y.o.  G0P0 and is complaining of questionable hemorrhoids with a lot of perineal itching and irritation.  Having some discomfort with voiding superficially with stinging and burning.  Was treated for hemorrhoids 2 weeks ago but notes no improvement in her symptoms.  Had called a telemedicine clinic and was prescribed Diflucan x2 doses but again notes no improvement.  Having a lot of itching and irritation perianally as well as peri-vaginally.  Some discharge.  No odor.  No frequency, urgency, low back pain, fever or chills.  Patient also requests STD screening of GC/chlamydia.  No known exposure but just wants reassurance given the symptoms.  Past medical history,surgical history, problem list, medications, allergies, family history and social history were all reviewed and documented in the EPIC chart.  Directed ROS with pertinent positives and negatives documented in the history of present illness/assessment and plan.  Exam: Kennon Portela assistant Vitals:   07/13/17 0914  BP: 118/76   General appearance:  Normal Abdomen soft nontender without masses guarding rebound. Pelvic external BUS vagina with perivaginal/perianal rash consistent with fungal.  White discharge noted.  Cervix normal with IUD string visualized.  Uterus normal size midline mobile nontender.  Adnexa without masses or tenderness.  Assessment/Plan:  37 y.o. G0P0 with history and exam as above.  Wet prep was negative.  Urine analysis shows few bacteria but 6-10 squamous cells.  Will culture and treat if grows bacteria.  Given clinical picture I think she has a yeast vulvovaginitis despite negative wet prep.  Classic in appearance with well demarcated rash.  Will cover with Diflucan 200 mg daily x7 days given the cutaneous involvement and Mycolog ointment twice daily.  Patient will call in several days if she does not start to see improvement.  Otherwise assuming that it completely  clears then she will follow-up routinely.    Dara Lords MD, 9:26 AM 07/13/2017

## 2017-07-13 NOTE — Patient Instructions (Signed)
Take the Diflucan pill daily for 7 days. Use the prescribed ointment twice daily.  Call if you do not start to see improvement this week.

## 2017-07-14 LAB — CULTURE INDICATED

## 2017-07-16 LAB — URINE CULTURE
MICRO NUMBER: 90524502
Result:: NO GROWTH
SPECIMEN QUALITY:: ADEQUATE

## 2017-07-29 ENCOUNTER — Telehealth: Payer: Self-pay | Admitting: *Deleted

## 2017-07-29 MED ORDER — TERCONAZOLE 0.4 % VA CREA: 1.0000 | TOPICAL_CREAM | Freq: Every day | VAGINAL | 0 refills | Status: DC

## 2017-07-29 NOTE — Telephone Encounter (Signed)
Patient was treated for subacute vaginitis on 07/13/17, completed medication, called stating yesterday her symptoms returned, vaginal itching, brown discharge, no vaginal odor. She asked if medication can be refilled? Please advise

## 2017-07-29 NOTE — Telephone Encounter (Signed)
I would recommend trial of Terazol 7 day.  I realize messier than pills but can treat resistant yeast better than the Diflucan.

## 2017-07-29 NOTE — Telephone Encounter (Signed)
Left detailed message on voicemail, Rx sent.  

## 2017-09-21 ENCOUNTER — Ambulatory Visit: Payer: PRIVATE HEALTH INSURANCE | Admitting: Women's Health

## 2017-09-21 ENCOUNTER — Encounter: Payer: Self-pay | Admitting: Women's Health

## 2017-09-21 VITALS — BP 122/80

## 2017-09-21 DIAGNOSIS — B373 Candidiasis of vulva and vagina: Secondary | ICD-10-CM

## 2017-09-21 DIAGNOSIS — R300 Dysuria: Secondary | ICD-10-CM | POA: Diagnosis not present

## 2017-09-21 DIAGNOSIS — N898 Other specified noninflammatory disorders of vagina: Secondary | ICD-10-CM

## 2017-09-21 DIAGNOSIS — B3731 Acute candidiasis of vulva and vagina: Secondary | ICD-10-CM

## 2017-09-21 LAB — WET PREP FOR TRICH, YEAST, CLUE

## 2017-09-21 MED ORDER — NYSTATIN-TRIAMCINOLONE 100000-0.1 UNIT/GM-% EX OINT: 1.0000 "application " | TOPICAL_OINTMENT | Freq: Two times a day (BID) | CUTANEOUS | 0 refills | Status: DC

## 2017-09-21 MED ORDER — FLUCONAZOLE 200 MG PO TABS
ORAL_TABLET | ORAL | 0 refills | Status: DC
Start: 2017-09-21 — End: 2017-10-19

## 2017-09-21 NOTE — Progress Notes (Signed)
37 year old MWF G0 presents with complaint of external vaginal irritation with itching and burning for the past 4 days.  States has frequent yeast infections causing vaginal irritation itching and burning.  Denies pain or burning with urination other than when urine touches the skin, no pain at end of stream, pressure or fever.  02/2017  Mirena IUD rare spotting.  Bipolar psychiatrist manages.  Exam: Appears well.  No CVAT.  External genitalia extremely erythematous labia minora extending to rectum, speculum exam scant white discharge vaginal walls less erythematous than external skin, wet prep positive for yeast.  IUD strings in os. UA: Negative nitrites, negative leukocytes, 0-5 WBCs, 0-2 RBCs, few bacteria  Recurrent yeast vaginitis  Plan: Options reviewed Mycolog ointment twice daily externally, Diflucan 200 mg every 3 days for 3 several doses, yeast prevention discussed.  Also reviewed boric acid will wait at this time.

## 2017-09-21 NOTE — Patient Instructions (Signed)

## 2017-09-23 ENCOUNTER — Encounter (INDEPENDENT_AMBULATORY_CARE_PROVIDER_SITE_OTHER): Payer: Self-pay

## 2017-09-23 LAB — URINALYSIS, COMPLETE W/RFL CULTURE
BILIRUBIN URINE: NEGATIVE
GLUCOSE, UA: NEGATIVE
Hgb urine dipstick: NEGATIVE
Hyaline Cast: NONE SEEN /LPF
KETONES UR: NEGATIVE
LEUKOCYTE ESTERASE: NEGATIVE
Nitrites, Initial: NEGATIVE
PROTEIN: NEGATIVE
Specific Gravity, Urine: 1.027 (ref 1.001–1.03)
pH: 6 (ref 5.0–8.0)

## 2017-09-23 LAB — URINE CULTURE
MICRO NUMBER:: 90810639
RESULT: NO GROWTH
SPECIMEN QUALITY: ADEQUATE

## 2017-09-23 LAB — CULTURE INDICATED

## 2017-10-19 ENCOUNTER — Ambulatory Visit: Payer: PRIVATE HEALTH INSURANCE | Admitting: Gynecology

## 2017-10-19 ENCOUNTER — Encounter: Payer: Self-pay | Admitting: Gynecology

## 2017-10-19 VITALS — BP 118/76

## 2017-10-19 DIAGNOSIS — B9689 Other specified bacterial agents as the cause of diseases classified elsewhere: Secondary | ICD-10-CM | POA: Diagnosis not present

## 2017-10-19 DIAGNOSIS — N760 Acute vaginitis: Secondary | ICD-10-CM

## 2017-10-19 LAB — WET PREP FOR TRICH, YEAST, CLUE

## 2017-10-19 MED ORDER — TRIAMCINOLONE ACETONIDE 0.1 % EX CREA: 1.0000 "application " | TOPICAL_CREAM | Freq: Two times a day (BID) | CUTANEOUS | 1 refills | Status: DC

## 2017-10-19 MED ORDER — METRONIDAZOLE 500 MG PO TABS: 500.0000 mg | ORAL_TABLET | Freq: Two times a day (BID) | ORAL | 0 refills | Status: DC

## 2017-10-19 NOTE — Progress Notes (Signed)
    Abigail Higgins [birth year] [id number]        37 y.o.  G0P0 presents complaining of perineal rash and darkish vaginal discharge.  She is been treated several times over the past year for both bacterial and yeast vaginitis.  Most recently treated for yeast beginning of July with Mycolog ointment and Diflucan 200 mg every 3 days for 3 doses.  Her symptoms appear to have resolved but then quickly recurred when she stopped medication.  No urinary symptoms such as frequency dysuria urgency fever or chills.  No vaginal odor  Past medical history,surgical history, problem list, medications, allergies, family history and social history were all reviewed and documented in the EPIC chart.  Directed ROS with pertinent positives and negatives documented in the history of present illness/assessment and plan.  Exam: Kennon PortelaKim Higgins assistant Vitals:   10/19/17 1503  BP: 118/76   General appearance:  Normal Abdomen soft nontender without masses guarding rebound Pelvic external BUS vagina with yellowish discharge.  Well demarcated rash involving the lower vulva lateral to the posterior fourchette around perianal region.  Cervix normal.  Uterus normal size midline mobile nontender.  Adnexa without masses or tenderness.  Assessment/Plan:  37 y.o. G0P0 with history of multiple recent vaginal infections and vulvar rashes.  Was treated as yeast with resolution of her symptoms.  Has also been treated as bacterial vaginosis in the past.  Wet prep today does show clue cells.  No yeast visualized.  Will treat as bacterial vaginosis to include Flagyl 500 mg twice daily x7 days, alcohol avoidance reviewed.  We will also treat with triamcinolone cream 0.1% twice daily for the external rash.  If she fails this then will treat with Terazol arbitrarily as a resistant yeast given the multiple recent Diflucan treatments.  She will call me in 2 weeks if she still is having persistence of her symptoms.    Abigail Lordsimothy P Virgil Lightner MD,  3:51 PM 10/19/2017

## 2017-10-19 NOTE — Patient Instructions (Signed)
Take the Flagyl medication twice daily for 7 days.  Avoid alcohol while taking. Apply the triamcinolone steroid cream externally twice daily.

## 2017-11-02 ENCOUNTER — Telehealth: Payer: Self-pay | Admitting: *Deleted

## 2017-11-02 NOTE — Telephone Encounter (Signed)
Patient called to follow up from OV on 10/19/17 completed the Flagyl 500 mg twice daily x7 days, stopped the cream because the rash is gone now. But does still report vaginal aching feeling since 10/30/17 no discharge, no odor, no itching very slight discomfort with urination, but states its not burning. She wanted to know if you have any other recommendations?  Please advise

## 2017-11-02 NOTE — Telephone Encounter (Signed)
It sounds like overall she is doing better symptom wise.  I think at this point I would do nothing as sometimes irritation can take a little bit to clear and hopefully this will resolve on its own.

## 2017-11-03 NOTE — Telephone Encounter (Signed)
Patient informed with the below note.  

## 2017-12-14 ENCOUNTER — Telehealth: Payer: Self-pay | Admitting: *Deleted

## 2017-12-14 MED ORDER — METRONIDAZOLE 500 MG PO TABS: 500.0000 mg | ORAL_TABLET | Freq: Two times a day (BID) | ORAL | 0 refills | Status: DC

## 2017-12-14 NOTE — Telephone Encounter (Signed)
Okay for Flagyl 500 mg twice daily x7 days, alcohol avoidance.

## 2017-12-14 NOTE — Telephone Encounter (Signed)
Patient informed, Rx sent.  

## 2017-12-14 NOTE — Telephone Encounter (Signed)
Patient called c/o brown discharge, with vaginal itching, no odor, was treated for BV on 10/19/17, she asked if Rx could be sent for this and if no relief will schedule OV. Please advise

## 2018-02-08 ENCOUNTER — Encounter: Payer: Self-pay | Admitting: Gynecology

## 2018-02-08 ENCOUNTER — Ambulatory Visit: Payer: PRIVATE HEALTH INSURANCE | Admitting: Gynecology

## 2018-02-08 VITALS — BP 118/78 | Ht 66.0 in | Wt 184.0 lb

## 2018-02-08 DIAGNOSIS — N760 Acute vaginitis: Secondary | ICD-10-CM

## 2018-02-08 DIAGNOSIS — Z30431 Encounter for routine checking of intrauterine contraceptive device: Secondary | ICD-10-CM | POA: Diagnosis not present

## 2018-02-08 DIAGNOSIS — Z01419 Encounter for gynecological examination (general) (routine) without abnormal findings: Secondary | ICD-10-CM | POA: Diagnosis not present

## 2018-02-08 LAB — WET PREP FOR TRICH, YEAST, CLUE

## 2018-02-08 MED ORDER — FLUCONAZOLE 150 MG PO TABS: 150.0000 mg | ORAL_TABLET | ORAL | 2 refills | Status: DC

## 2018-02-08 NOTE — Patient Instructions (Signed)
Start the boric acid vaginal suppositories twice weekly Take 1 Diflucan pill weekly for 12 weeks Call if any recurrent vaginitis  Schedule your screening mammography after you turn age 37.

## 2018-02-08 NOTE — Addendum Note (Signed)
Addended by: Dayna BarkerGARDNER, Rawleigh Rode K on: 02/08/2018 04:53 PM   Modules accepted: Orders

## 2018-02-08 NOTE — Progress Notes (Signed)
    Abigail Higgins Jun 13, 1980 [id number]        37 y.o.  G0P0 for annual gynecologic exam.  Also having issues with recurrent vaginitis.  Has been seen here as well as through tele-doc and OTC product self treatment with multiple episodes of both bacterial and yeast vaginitis.  Most recently was treated over the phone with Diflucan 150 mg x 2 separate doses 3 days apart and external steroid/antifungal cream.  Patient notes that she does seem better than before treatment.  Past medical history,surgical history, problem list, medications, allergies, family history and social history were all reviewed and documented as reviewed in the EPIC chart.  ROS:  Performed with pertinent positives and negatives included in the history, assessment and plan.   Additional significant findings : None   Exam: Kennon PortelaKim Gardner assistant Vitals:   02/08/18 1602  BP: 118/78  Weight: 184 lb (83.5 kg)  Height: 5\' 6"  (1.676 m)   Body mass index is 29.7 kg/m.  General appearance:  Normal affect, orientation and appearance. Skin: Grossly normal HEENT: Without gross lesions.  No cervical or supraclavicular adenopathy. Thyroid normal.  Lungs:  Clear without wheezing, rales or rhonchi Cardiac: RR, without RMG Abdominal:  Soft, nontender, without masses, guarding, rebound, organomegaly or hernia Breasts:  Examined lying and sitting without masses, retractions, discharge or axillary adenopathy. Pelvic:  Ext, BUS, Vagina: With white discharge  Cervix: Normal with IUD string visualized and appropriate length  Uterus: Anteverted, normal size, shape and contour, midline and mobile nontender   Adnexa: Without masses or tenderness    Anus and perineum: Normal   Rectovaginal: Normal sphincter tone without palpated masses or tenderness.    Assessment/Plan:  37 y.o. G0P0 female for annual gynecologic exam without menses, Mirena IUD.   1. Recurrent vaginitis.  Has both yeast and BV recurrent vaginitis although sounds  mostly yeast.  Options for suppressive management reviewed and ultimately we have decided on Diflucan 150 mg weekly x12 weeks and boric acid suppositories 600 mg intravaginal twice weekly.  She actually already has these that she bought online and will start them and will see if this does not suppress her recurrences.  Also recommended checking a CBC for white count, CMP for diabetes screen/health screen and vitamin D level due to the recurrent vaginitis. 2. Mirena IUD 02/2017.  Doing well without menses.  IUD string visualized. 3. Pap smear/HPV 05/2014.  No Pap smear done today.  No history of significant abnormal Pap smears.  Plan repeat Pap smear/HPV at 5-year interval per current screening guidelines. 4. Breast health.  Breast exam normal today.  SBE monthly reviewed.  Plan baseline mammogram next year as she turns 38. 5. Health maintenance.  No routine lab work done as patient does this elsewhere.  Follow-up in 1 year, sooner as needed.   Dara Lordsimothy P Jaime Dome MD, 4:30 PM 02/08/2018

## 2018-02-09 LAB — COMPREHENSIVE METABOLIC PANEL
AG RATIO: 2 (calc) (ref 1.0–2.5)
ALBUMIN MSPROF: 4.5 g/dL (ref 3.6–5.1)
ALKALINE PHOSPHATASE (APISO): 58 U/L (ref 33–115)
ALT: 17 U/L (ref 6–29)
AST: 18 U/L (ref 10–30)
BILIRUBIN TOTAL: 0.6 mg/dL (ref 0.2–1.2)
BUN: 15 mg/dL (ref 7–25)
CALCIUM: 9.9 mg/dL (ref 8.6–10.2)
CHLORIDE: 103 mmol/L (ref 98–110)
CO2: 28 mmol/L (ref 20–32)
Creat: 0.76 mg/dL (ref 0.50–1.10)
Globulin: 2.3 g/dL (calc) (ref 1.9–3.7)
Glucose, Bld: 101 mg/dL — ABNORMAL HIGH (ref 65–99)
POTASSIUM: 4.1 mmol/L (ref 3.5–5.3)
Sodium: 139 mmol/L (ref 135–146)
Total Protein: 6.8 g/dL (ref 6.1–8.1)

## 2018-02-09 LAB — CBC WITH DIFFERENTIAL/PLATELET
BASOS ABS: 63 {cells}/uL (ref 0–200)
Basophils Relative: 0.8 %
EOS ABS: 111 {cells}/uL (ref 15–500)
Eosinophils Relative: 1.4 %
HCT: 37.9 % (ref 35.0–45.0)
Hemoglobin: 13.2 g/dL (ref 11.7–15.5)
Lymphs Abs: 2038 cells/uL (ref 850–3900)
MCH: 31.8 pg (ref 27.0–33.0)
MCHC: 34.8 g/dL (ref 32.0–36.0)
MCV: 91.3 fL (ref 80.0–100.0)
MPV: 10.4 fL (ref 7.5–12.5)
Monocytes Relative: 3.9 %
NEUTROS PCT: 68.1 %
Neutro Abs: 5380 cells/uL (ref 1500–7800)
Platelets: 287 10*3/uL (ref 140–400)
RBC: 4.15 10*6/uL (ref 3.80–5.10)
RDW: 12.6 % (ref 11.0–15.0)
TOTAL LYMPHOCYTE: 25.8 %
WBC: 7.9 10*3/uL (ref 3.8–10.8)
WBCMIX: 308 {cells}/uL (ref 200–950)

## 2018-02-09 LAB — VITAMIN D 25 HYDROXY (VIT D DEFICIENCY, FRACTURES): VIT D 25 HYDROXY: 17 ng/mL — AB (ref 30–100)

## 2018-02-15 ENCOUNTER — Other Ambulatory Visit: Payer: Self-pay | Admitting: Gynecology

## 2018-02-15 DIAGNOSIS — E559 Vitamin D deficiency, unspecified: Secondary | ICD-10-CM

## 2018-02-15 MED ORDER — VITAMIN D (ERGOCALCIFEROL) 1.25 MG (50000 UNIT) PO CAPS: 50000.0000 [IU] | ORAL_CAPSULE | ORAL | 0 refills | Status: DC

## 2018-05-19 ENCOUNTER — Ambulatory Visit: Payer: PRIVATE HEALTH INSURANCE | Admitting: Gynecology

## 2018-05-19 ENCOUNTER — Encounter: Payer: Self-pay | Admitting: Gynecology

## 2018-05-19 VITALS — BP 124/82

## 2018-05-19 DIAGNOSIS — N760 Acute vaginitis: Secondary | ICD-10-CM | POA: Diagnosis not present

## 2018-05-19 DIAGNOSIS — B9689 Other specified bacterial agents as the cause of diseases classified elsewhere: Secondary | ICD-10-CM

## 2018-05-19 LAB — WET PREP FOR TRICH, YEAST, CLUE

## 2018-05-19 MED ORDER — METRONIDAZOLE 500 MG PO TABS: 500.0000 mg | ORAL_TABLET | Freq: Two times a day (BID) | ORAL | 0 refills | Status: DC

## 2018-05-19 NOTE — Addendum Note (Signed)
Addended by: Dayna Barker on: 05/19/2018 04:52 PM   Modules accepted: Orders

## 2018-05-19 NOTE — Patient Instructions (Signed)
Take the Flagyl medication twice daily for 7 days.  Avoid alcohol while taking. 

## 2018-05-19 NOTE — Progress Notes (Signed)
    Abigail Higgins Mar 10, 1981 [id number]        38 y.o.  G0P0 presents with a history of recurrent vaginitis both bacterial and yeast having been seen in November started on a 12-week course of Diflucan 150 mg weekly and boric acid suppositories twice weekly.  She is done well with no symptoms since until 2 days ago when she had the onset of a darkish discharge with odor.  No real itching.  No urinary symptoms such as frequency dysuria urgency.  Past medical history,surgical history, problem list, medications, allergies, family history and social history were all reviewed and documented in the EPIC chart.  Directed ROS with pertinent positives and negatives documented in the history of present illness/assessment and plan.  Exam: Kennon Portela assistant Vitals:   05/19/18 1620  BP: 124/82   General appearance:  Normal Abdomen soft nontender without masses guarding rebound Pelvic external BUS vagina with slight discharge.  Cervix normal.  IUD string visualized.  Uterus normal size midline mobile nontender.  Adnexa without masses or tenderness.  Assessment/Plan:  38 y.o. G0P0 with history as above.  Wet prep is negative.  I suspect she has a low-grade early bacterial vaginosis.  Options for treatment were reviewed.  Elects for Flagyl 500 mg twice daily x7 days.  We will follow-up if her symptoms persist, worsen or recur.    Dara Lords MD, 4:41 PM 05/19/2018

## 2018-06-14 ENCOUNTER — Telehealth: Payer: Self-pay | Admitting: *Deleted

## 2018-06-14 MED ORDER — METRONIDAZOLE 500 MG PO TABS: 500.0000 mg | ORAL_TABLET | Freq: Two times a day (BID) | ORAL | 0 refills | Status: DC

## 2018-06-14 NOTE — Telephone Encounter (Signed)
Patient informed, Rx sent.  

## 2018-06-14 NOTE — Telephone Encounter (Signed)
Patient was treated for BV on 05/19/18 prescribed Flagyl 500 mg x 7 days, patient called today c/o same symptoms again,  Itching and brown discharge,slight she works at [employer]. She asked if refill for same Rx could be sent? States it did help last time. Please advise

## 2018-06-14 NOTE — Telephone Encounter (Signed)
Okay to refill Flagyl

## 2018-07-12 ENCOUNTER — Ambulatory Visit: Payer: PRIVATE HEALTH INSURANCE | Admitting: Gynecology

## 2018-07-12 ENCOUNTER — Other Ambulatory Visit: Payer: Self-pay

## 2018-07-12 ENCOUNTER — Encounter: Payer: Self-pay | Admitting: Gynecology

## 2018-07-12 VITALS — BP 118/76

## 2018-07-12 DIAGNOSIS — N761 Subacute and chronic vaginitis: Secondary | ICD-10-CM | POA: Diagnosis not present

## 2018-07-12 LAB — WET PREP FOR TRICH, YEAST, CLUE

## 2018-07-12 MED ORDER — CLINDAMYCIN PHOSPHATE 2 % VA CREA: 1.0000 | TOPICAL_CREAM | Freq: Every day | VAGINAL | 0 refills | Status: DC

## 2018-07-12 MED ORDER — FLUCONAZOLE 150 MG PO TABS: 150.0000 mg | ORAL_TABLET | Freq: Once | ORAL | 0 refills | Status: AC

## 2018-07-12 NOTE — Addendum Note (Signed)
Addended by: Dayna Barker on: 07/12/2018 11:26 AM   Modules accepted: Orders

## 2018-07-12 NOTE — Progress Notes (Signed)
    Abigail Higgins 1980-04-27 [id number]        38 y.o.  G0P0 presents complaining of vaginal discharge with irritation and discomfort.  No urinary symptoms such as frequency dysuria urgency low back pain fever or chills.  Have been treated twice in March for bacterial vaginosis, the second time over the phone with Flagyl.  Felt a little better after treatment but did not feel like the symptoms totally went away.  Past medical history,surgical history, problem list, medications, allergies, family history and social history were all reviewed and documented in the EPIC chart.  Directed ROS with pertinent positives and negatives documented in the history of present illness/assessment and plan.  Exam: Kennon Portela assistant Vitals:   07/12/18 0939  BP: 118/76   General appearance:  Normal Abdomen soft nontender without masses guarding rebound Pelvic external BUS vagina with clumpy white discharge.  Cervix normal.  Uterus normal size midline mobile nontender.  Adnexa without masses or tenderness.  Assessment/Plan:  38 y.o. G0P0 with history as above.  Wet prep shows clue cells but no yeast.  Clinically appears more yeast.  Will cover with Cleocin vaginal cream nightly x7 nights and Diflucan 150 mg x 3 doses with 1 pill now, repeat in several days and last pill at the end of her 7-day Cleocin treatment.  Follow-up if symptoms persist, worsen or recur.    Dara Lords MD, 9:50 AM 07/12/2018

## 2018-07-12 NOTE — Patient Instructions (Signed)
Use the vaginal cream nightly for 7 nights  Take the Diflucan pill now, in several days and at the end of your 7-day vaginal cream treatment for a total of 3 pills

## 2018-08-04 ENCOUNTER — Encounter: Payer: Self-pay | Admitting: Gynecology

## 2018-08-04 ENCOUNTER — Ambulatory Visit: Payer: PRIVATE HEALTH INSURANCE | Admitting: Gynecology

## 2018-08-04 ENCOUNTER — Other Ambulatory Visit: Payer: Self-pay

## 2018-08-04 VITALS — BP 116/76

## 2018-08-04 DIAGNOSIS — N765 Ulceration of vagina: Secondary | ICD-10-CM

## 2018-08-04 NOTE — Patient Instructions (Signed)
Office will call you with the culture results. 

## 2018-08-04 NOTE — Addendum Note (Signed)
Addended by: Dayna Barker on: 08/04/2018 04:48 PM   Modules accepted: Orders

## 2018-08-04 NOTE — Progress Notes (Signed)
    Abigail Higgins 12-08-1980 [id number]        38 y.o.  G0P0 presents with a history of recurrent vaginitis.  She also had reported times where there was skin cracking and we had discussed the possibility of viral and I asked her to come in with an outbreak if she would have another.  She started having an episode 2 days ago.  She has 2 areas of concern as outlined below.  No vaginal discharge, itching or urinary symptoms such as frequency dysuria urgency.  Past medical history,surgical history, problem list, medications, allergies, family history and social history were all reviewed and documented in the EPIC chart.  Directed ROS with pertinent positives and negatives documented in the history of present illness/assessment and plan.  Exam: Kennon Portela assistant Vitals:   08/04/18 1613  BP: 116/76   General appearance:  Normal Abdomen soft nontender without masses guarding rebound Pelvic external BUS vagina with small cluster of ulcers at the inner junction of the labia minora.  Small area of skin cracking at the junction of the lower labia majora and perineum.  Cervix normal.  Uterus normal size midline mobile nontender.  Adnexa without masses or tenderness. Physical Exam  Genitourinary:        Assessment/Plan:  38 y.o. 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  Viral screen done.  Area of skin cracking was also sampled although less classic for HSV.  We discussed the implications and options to include intermittent treatment with Valtrex if positive versus daily suppressive dosing.  As she appears to be having fairly common recurrences I would recommend daily suppressive therapy for now.  Will await the screening results and she will follow-up for these.    Dara Lords MD, 4:40 PM 08/04/2018

## 2018-08-05 LAB — WET PREP FOR TRICH, YEAST, CLUE

## 2018-08-12 LAB — SURESWAB HSV, TYPE 1/2 DNA, PCR
HSV 1 DNA: NOT DETECTED
HSV 2 DNA: NOT DETECTED

## 2018-12-07 ENCOUNTER — Encounter: Payer: Self-pay | Admitting: Gynecology

## 2019-01-19 ENCOUNTER — Other Ambulatory Visit: Payer: Self-pay

## 2019-01-19 ENCOUNTER — Encounter: Payer: Self-pay | Admitting: Gynecology

## 2019-01-19 ENCOUNTER — Ambulatory Visit: Payer: Managed Care, Other (non HMO) | Admitting: Gynecology

## 2019-01-19 VITALS — BP 124/80

## 2019-01-19 DIAGNOSIS — Z23 Encounter for immunization: Secondary | ICD-10-CM | POA: Diagnosis not present

## 2019-01-19 DIAGNOSIS — R8290 Unspecified abnormal findings in urine: Secondary | ICD-10-CM

## 2019-01-19 DIAGNOSIS — N898 Other specified noninflammatory disorders of vagina: Secondary | ICD-10-CM | POA: Diagnosis not present

## 2019-01-19 LAB — WET PREP FOR TRICH, YEAST, CLUE

## 2019-01-19 MED ORDER — NITROFURANTOIN MONOHYD MACRO 100 MG PO CAPS: 100.0000 mg | ORAL_CAPSULE | Freq: Two times a day (BID) | ORAL | 0 refills | Status: DC

## 2019-01-19 NOTE — Patient Instructions (Signed)
Take the prescribed antibiotic twice daily for 7 days.  Follow-up if your symptoms persist, worsen or recur. 

## 2019-01-19 NOTE — Progress Notes (Signed)
    Abigail Higgins 04/24/1980 [id number]        38 y.o.  G0P0 resents with several months of dark urine with odor.  Now with urgency.  No significant dysuria low back pain fever or chills.  Has a slight discharge.  No odor irritation or itching.  Past medical history,surgical history, problem list, medications, allergies, family history and social history were all reviewed and documented in the EPIC chart.  Directed ROS with pertinent positives and negatives documented in the history of present illness/assessment and plan.  Exam: Caryn Bee assistant Vitals:   01/19/19 1223  BP: 124/80   General appearance:  Normal Spine straight without CVA tenderness Abdomen soft nontender without masses guarding rebound Pelvic external BUS vagina with slight white discharge.  Cervix normal.  Uterus normal size midline mobile nontender.  Adnexa without masses or tenderness.  Assessment/Plan:  38 y.o. G0P0 with history and exam as above.  Urine analysis consistent with UTI.  Wet prep is negative.  Will treat with Macrobid 100 mg twice daily x7 days.  Follow-up if symptoms persist, worsen or recur.    Anastasio Auerbach MD, 12:33 PM 01/19/2019

## 2019-01-21 LAB — URINALYSIS, COMPLETE W/RFL CULTURE
Bilirubin Urine: NEGATIVE
Glucose, UA: NEGATIVE
Hgb urine dipstick: NEGATIVE
Hyaline Cast: NONE SEEN /LPF
Nitrites, Initial: NEGATIVE
Protein, ur: NEGATIVE
RBC / HPF: NONE SEEN /HPF (ref 0–2)
Specific Gravity, Urine: 1.025 (ref 1.001–1.03)
pH: 5 (ref 5.0–8.0)

## 2019-01-21 LAB — URINE CULTURE
MICRO NUMBER:: 1064004
SPECIMEN QUALITY:: ADEQUATE

## 2019-01-21 LAB — CULTURE INDICATED

## 2019-02-16 ENCOUNTER — Ambulatory Visit (INDEPENDENT_AMBULATORY_CARE_PROVIDER_SITE_OTHER): Payer: Managed Care, Other (non HMO) | Admitting: Gynecology

## 2019-02-16 ENCOUNTER — Encounter: Payer: Self-pay | Admitting: Gynecology

## 2019-02-16 ENCOUNTER — Other Ambulatory Visit: Payer: Self-pay

## 2019-02-16 VITALS — BP 122/76 | Ht 65.0 in | Wt 180.0 lb

## 2019-02-16 DIAGNOSIS — Z01419 Encounter for gynecological examination (general) (routine) without abnormal findings: Secondary | ICD-10-CM | POA: Diagnosis not present

## 2019-02-16 DIAGNOSIS — Z30431 Encounter for routine checking of intrauterine contraceptive device: Secondary | ICD-10-CM | POA: Diagnosis not present

## 2019-02-16 DIAGNOSIS — R8290 Unspecified abnormal findings in urine: Secondary | ICD-10-CM | POA: Diagnosis not present

## 2019-02-16 DIAGNOSIS — Z1151 Encounter for screening for human papillomavirus (HPV): Secondary | ICD-10-CM | POA: Diagnosis not present

## 2019-02-16 MED ORDER — CIPROFLOXACIN HCL 250 MG PO TABS: 250.0000 mg | ORAL_TABLET | Freq: Two times a day (BID) | ORAL | 0 refills | Status: DC

## 2019-02-16 NOTE — Progress Notes (Signed)
    JAMILLIA CLOSSON Aug 04, 1980 [id number]        38 y.o.  G0P0 for annual gynecologic exam.  Recently treated for UTI early November.  Symptoms resolved but now notes an odor to her urine.  No other symptoms such as frequency dysuria urgency.  Otherwise doing well.  Past medical history,surgical history, problem list, medications, allergies, family history and social history were all reviewed and documented as reviewed in the EPIC chart.  ROS:  Performed with pertinent positives and negatives included in the history, assessment and plan.   Additional significant findings : None   Exam: Caryn Bee assistant Vitals:   02/16/19 1612  BP: 122/76  Weight: 180 lb (81.6 kg)  Height: 5\' 5"  (1.651 m)   Body mass index is 29.95 kg/m.  General appearance:  Normal affect, orientation and appearance. Skin: Grossly normal HEENT: Without gross lesions.  No cervical or supraclavicular adenopathy. Thyroid normal.  Lungs:  Clear without wheezing, rales or rhonchi Cardiac: RR, without RMG Abdominal:  Soft, nontender, without masses, guarding, rebound, organomegaly or hernia Breasts:  Examined lying and sitting without masses, retractions, discharge or axillary adenopathy. Pelvic:  Ext, BUS, Vagina: Normal  Cervix: Normal.  Pap smear/HPV.  IUD string visualized  Uterus: Anteverted, normal size, shape and contour, midline and mobile nontender   Adnexa: Without masses or tenderness    Anus and perineum: Normal   Rectovaginal: Normal sphincter tone without palpated masses or tenderness.    Assessment/Plan:  38 y.o. G0P0 female for annual gynecologic exam.  Without menses, Mirena IUD  1. Urinary odor.  Urine analysis is consistent with UTI.  Had E. coli last time sensitive to Macrobid at 14.  Will treat with ciprofloxacin which had a sensitivity of 0.25.  Ciprofloxacin 250 mg twice daily x7 days.  Follow-up if symptoms persist or recur.  Black box warning reviewed with the patient. 2. Mirena IUD  02/2017.  Doing well without menses.  IUD string visualized. 3. Pap smear/HPV 05/2014.  Pap smear/HPV today.  No history of abnormal Pap smears previously. 4. Mammography 2003.  Recommend baseline mammogram now and patient will call and schedule.  Breast exam normal today. 5. Health maintenance.  No routine lab work done as patient reports is done elsewhere.  Follow-up 1 year, sooner as needed.   Anastasio Auerbach MD, 4:31 PM 02/16/2019

## 2019-02-16 NOTE — Patient Instructions (Signed)
Take the prescribed antibiotic twice daily for 7 days.  Follow-up if your urinary symptoms continue.   Call to Schedule your mammogram  Facilities in [place]: 1)  [facility]. [address] Phone: [phone] 2)  Dr. Isaiah Blakes at [facility] [address] Phone: [phone]     Mammogram A mammogram is an X-ray test to find changes in a woman's breast. You should get a mammogram if:  You are 40 years of age or older  You have risk factors.   Your doctor recommends that you have one.  BEFORE THE TEST  Do not schedule the test the week before your period, especially if your breasts are sore during this time.  On the day of your mammogram:  Wash your breasts and armpits well. After washing, do not put on any deodorant or talcum powder on until after your test.   Eat and drink as you usually do.   Take your medicines as usual.   If you are diabetic and take insulin, make sure you:   Eat before coming for your test.   Take your insulin as usual.   If you cannot keep your appointment, call before the appointment to cancel. Schedule another appointment.  TEST  You will need to undress from the waist up. You will put on a hospital gown.   Your breast will be put on the mammogram machine, and it will press firmly on your breast with a piece of plastic called a compression paddle. This will make your breast flatter so that the machine can X-ray all parts of your breast.   Both breasts will be X-rayed. Each breast will be X-rayed from above and from the side. An X-ray might need to be taken again if the picture is not good enough.   The mammogram will last about 15 to 30 minutes.  AFTER THE TEST Finding out the results of your test Ask when your test results will be ready. Make sure you get your test results.  Document Released: 05/30/2008 Document Revised: 02/20/2011 Document Reviewed:  05/30/2008 [facility] Patient Information 2012 [place].

## 2019-02-17 LAB — PAP IG AND HPV HIGH-RISK: HPV DNA High Risk: NOT DETECTED

## 2019-02-18 LAB — URINALYSIS, COMPLETE W/RFL CULTURE
Bilirubin Urine: NEGATIVE
Glucose, UA: NEGATIVE
Hyaline Cast: NONE SEEN /LPF
Nitrites, Initial: POSITIVE — AB
Protein, ur: NEGATIVE
Specific Gravity, Urine: 1.025 (ref 1.001–1.03)
pH: 6.5 (ref 5.0–8.0)

## 2019-02-18 LAB — URINE CULTURE
MICRO NUMBER:: 1155492
SPECIMEN QUALITY:: ADEQUATE

## 2019-02-18 LAB — CULTURE INDICATED

## 2019-07-04 ENCOUNTER — Other Ambulatory Visit: Payer: Self-pay

## 2019-07-04 ENCOUNTER — Encounter: Payer: Self-pay | Admitting: Nurse Practitioner

## 2019-07-04 ENCOUNTER — Ambulatory Visit: Payer: Managed Care, Other (non HMO) | Admitting: Nurse Practitioner

## 2019-07-04 VITALS — BP 124/80

## 2019-07-04 DIAGNOSIS — N3281 Overactive bladder: Secondary | ICD-10-CM

## 2019-07-04 DIAGNOSIS — R350 Frequency of micturition: Secondary | ICD-10-CM | POA: Diagnosis not present

## 2019-07-04 LAB — URINALYSIS, COMPLETE W/RFL CULTURE
Bacteria, UA: NONE SEEN /HPF
Bilirubin Urine: NEGATIVE
Glucose, UA: NEGATIVE
Hgb urine dipstick: NEGATIVE
Hyaline Cast: NONE SEEN /LPF
Ketones, ur: NEGATIVE
Leukocyte Esterase: NEGATIVE
Nitrites, Initial: NEGATIVE
Protein, ur: NEGATIVE
RBC / HPF: NONE SEEN /HPF (ref 0–2)
Specific Gravity, Urine: 1.025 (ref 1.001–1.03)
WBC, UA: NONE SEEN /HPF (ref 0–5)
pH: 7 (ref 5.0–8.0)

## 2019-07-04 LAB — NO CULTURE INDICATED

## 2019-07-04 LAB — WET PREP FOR TRICH, YEAST, CLUE

## 2019-07-04 MED ORDER — OXYBUTYNIN CHLORIDE ER 5 MG PO TB24: 5.0000 mg | ORAL_TABLET | Freq: Every day | ORAL | 6 refills | Status: DC

## 2019-07-04 NOTE — Progress Notes (Signed)
History: 39 y.o. MWF G0P0 complains of urinary urgency for 2 months. Denies frequency, burning with urination, or hematuria. Denies vaginal itching, discharge, or odor.  Wears pads due to leakage.  Not able to make it to the bathroom at work at times.  Exam: Appears well, no acute distress Abdomen: soft, nontender, no rebound or guarding GU: No CVA External genitalia: normal, no erythema or discharge Vagina: normal, no erythema or discharge Cervix: normal, no discharge  UA negative Wet prep negative  Assessment:  Overactive bladder Premenopausal  Plan: Ditropan 5 mg at bedtime daily.  Educated on possible side effects and the importance of drinking lots of water and doing Kegel exercises to strengthen vaginal walls.  Return to office as needed if symptoms do not improve or worsen.

## 2019-07-04 NOTE — Patient Instructions (Signed)

## 2019-11-23 ENCOUNTER — Encounter: Payer: Self-pay | Admitting: Nurse Practitioner

## 2019-11-23 ENCOUNTER — Other Ambulatory Visit: Payer: Self-pay

## 2019-11-23 ENCOUNTER — Ambulatory Visit: Payer: Managed Care, Other (non HMO) | Admitting: Nurse Practitioner

## 2019-11-23 VITALS — BP 122/80

## 2019-11-23 DIAGNOSIS — B373 Candidiasis of vulva and vagina: Secondary | ICD-10-CM

## 2019-11-23 DIAGNOSIS — R300 Dysuria: Secondary | ICD-10-CM | POA: Diagnosis not present

## 2019-11-23 DIAGNOSIS — N898 Other specified noninflammatory disorders of vagina: Secondary | ICD-10-CM | POA: Diagnosis not present

## 2019-11-23 DIAGNOSIS — B3731 Acute candidiasis of vulva and vagina: Secondary | ICD-10-CM

## 2019-11-23 LAB — URINALYSIS, COMPLETE W/RFL CULTURE
Bacteria, UA: NONE SEEN /HPF
Bilirubin Urine: NEGATIVE
Glucose, UA: NEGATIVE
Hgb urine dipstick: NEGATIVE
Hyaline Cast: NONE SEEN /LPF
Ketones, ur: NEGATIVE
Leukocyte Esterase: NEGATIVE
Nitrites, Initial: NEGATIVE
Protein, ur: NEGATIVE
RBC / HPF: NONE SEEN /HPF (ref 0–2)
Specific Gravity, Urine: 1.02 (ref 1.001–1.03)
WBC, UA: NONE SEEN /HPF (ref 0–5)
pH: 6 (ref 5.0–8.0)

## 2019-11-23 LAB — WET PREP FOR TRICH, YEAST, CLUE

## 2019-11-23 LAB — NO CULTURE INDICATED

## 2019-11-23 MED ORDER — FLUCONAZOLE 150 MG PO TABS: 150.0000 mg | ORAL_TABLET | ORAL | 0 refills | Status: AC

## 2019-11-23 NOTE — Patient Instructions (Signed)
Vaginal Yeast Infection, Adult  Vaginal yeast infection is a condition that causes vaginal discharge as well as soreness, swelling, and redness (inflammation) of the vagina. This is a common condition. Some women get this infection frequently. What are the causes? This condition is caused by a change in the normal balance of the yeast (candida) and bacteria that live in the vagina. This change causes an overgrowth of yeast, which causes the inflammation. What increases the risk? The condition is more likely to develop in women who:  Take antibiotic medicines.  Have diabetes.  Take birth control pills.  Are pregnant.  Douche often.  Have a weak body defense system (immune system).  Have been taking steroid medicines for a long time.  Frequently wear tight clothing. What are the signs or symptoms? Symptoms of this condition include:  White, thick, creamy vaginal discharge.  Swelling, itching, redness, and irritation of the vagina. The lips of the vagina (vulva) may be affected as well.  Pain or a burning feeling while urinating.  Pain during sex. How is this diagnosed? This condition is diagnosed based on:  Your medical history.  A physical exam.  A pelvic exam. Your health care provider will examine a sample of your vaginal discharge under a microscope. Your health care provider may send this sample for testing to confirm the diagnosis. How is this treated? This condition is treated with medicine. Medicines may be over-the-counter or prescription. You may be told to use one or more of the following:  Medicine that is taken by mouth (orally).  Medicine that is applied as a cream (topically).  Medicine that is inserted directly into the vagina (suppository). Follow these instructions at home:  Lifestyle  Do not have sex until your health care provider approves. Tell your sex partner that you have a yeast infection. That person should go to his or her health care  provider and ask if they should also be treated.  Do not wear tight clothes, such as pantyhose or tight pants.  Wear breathable cotton underwear. General instructions  Take or apply over-the-counter and prescription medicines only as told by your health care provider.  Eat more yogurt. This may help to keep your yeast infection from returning.  Do not use tampons until your health care provider approves.  Try taking a sitz bath to help with discomfort. This is a warm water bath that is taken while you are sitting down. The water should only come up to your hips and should cover your buttocks. Do this 3-4 times per day or as told by your health care provider.  Do not douche.  If you have diabetes, keep your blood sugar levels under control.  Keep all follow-up visits as told by your health care provider. This is important. Contact a health care provider if:  You have a fever.  Your symptoms go away and then return.  Your symptoms do not get better with treatment.  Your symptoms get worse.  You have new symptoms.  You develop blisters in or around your vagina.  You have blood coming from your vagina and it is not your menstrual period.  You develop pain in your abdomen. Summary  Vaginal yeast infection is a condition that causes discharge as well as soreness, swelling, and redness (inflammation) of the vagina.  This condition is treated with medicine. Medicines may be over-the-counter or prescription.  Take or apply over-the-counter and prescription medicines only as told by your health care provider.  Do not douche.   Do not have sex or use tampons until your health care provider approves.  Contact a health care provider if your symptoms do not get better with treatment or your symptoms go away and then return. This information is not intended to replace advice given to you by your health care provider. Make sure you discuss any questions you have with your health care  provider. Document Revised: 10/01/2018 Document Reviewed: 07/20/2017 Elsevier Patient Education  2020 [employer]  

## 2019-11-23 NOTE — Progress Notes (Signed)
   Acute Office Visit  Subjective:    Patient ID: Abigail Higgins, female    DOB: 1980/06/05, 39 y.o.   MRN: [id number]   HPI 39 y.o. presents today for vaginal itching and irritation. Took Monistat 2 days ago with some relief. Noticed blood while wiping but thinks it may be from excoriation.    Review of Systems  Constitutional: Negative.   Genitourinary: Positive for vaginal pain (itching/irritation). Negative for dysuria, frequency, hematuria, urgency and vaginal discharge.       Objective:    Physical Exam Constitutional:      Appearance: Normal appearance.  Genitourinary:    General: Normal vulva.     Vagina: Vaginal discharge and erythema present.     Cervix: Normal.     Uterus: Normal.      BP 122/80 (BP Location: Right Arm, Patient Position: Sitting, Cuff Size: Normal)  Wt Readings from Last 3 Encounters:  02/16/19 180 lb (81.6 kg)  02/08/18 184 lb (83.5 kg)  07/01/17 189 lb (85.7 kg)   Wet prep + yeast UA negative      Assessment & Plan:   Problem List Items Addressed This Visit    None    Visit Diagnoses    Vaginal candida    -  Primary   Relevant Medications   fluconazole (DIFLUCAN) 150 MG tablet   Burning with urination       Relevant Orders   Urinalysis,Complete w/RFL Culture   Vaginal irritation       Relevant Orders   WET PREP FOR TRICH, YEAST, CLUE     Plan: Wet prep positive for yeast. Diflucan 150 mg today and repeat in 3 days for a total of 2 doses. UA unremarkable. Will return if symptoms worsen or do not improve. She is agreeable to plan.      Olivia Mackie [facility], 4:36 PM 11/23/2019

## 2020-02-20 DIAGNOSIS — F339 Major depressive disorder, recurrent, unspecified: Secondary | ICD-10-CM | POA: Insufficient documentation

## 2020-02-21 ENCOUNTER — Encounter: Payer: Managed Care, Other (non HMO) | Admitting: Nurse Practitioner

## 2020-02-29 ENCOUNTER — Encounter: Payer: Self-pay | Admitting: Nurse Practitioner

## 2020-02-29 ENCOUNTER — Other Ambulatory Visit: Payer: Self-pay

## 2020-02-29 ENCOUNTER — Ambulatory Visit: Payer: Managed Care, Other (non HMO) | Admitting: Nurse Practitioner

## 2020-02-29 VITALS — BP 132/80 | Ht 65.0 in | Wt 211.0 lb

## 2020-02-29 DIAGNOSIS — Z23 Encounter for immunization: Secondary | ICD-10-CM | POA: Diagnosis not present

## 2020-02-29 DIAGNOSIS — Z01419 Encounter for gynecological examination (general) (routine) without abnormal findings: Secondary | ICD-10-CM

## 2020-02-29 DIAGNOSIS — Z30431 Encounter for routine checking of intrauterine contraceptive device: Secondary | ICD-10-CM | POA: Diagnosis not present

## 2020-02-29 NOTE — Progress Notes (Signed)
   Abigail Higgins 10/26/1980 [id number]   History:  39 y.o. G0 presents for annual exam. Mirena IUD inserted 02/2017 with occasional spotting. 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 PCP managing depression/anxiety.   Gynecologic History No LMP recorded. (Menstrual status: IUD).   Contraception: IUD Last Pap: 02/16/2019. Results were: normal Last mammogram: 2003. Results were: normal  Past medical history, past surgical history, family history and social history were all reviewed and documented in the EPIC chart.  ROS:  A ROS was performed and pertinent positives and negatives are included.  Exam:  Vitals:   02/29/20 1610  BP: 132/80  Weight: 211 lb (95.7 kg)  Height: 5\' 5"  (1.651 m)   Body mass index is 35.11 kg/m.  General appearance:  Normal Thyroid:  Symmetrical, normal in size, without palpable masses or nodularity. Respiratory  Auscultation:  Clear without wheezing or rhonchi Cardiovascular  Auscultation:  Regular rate, without rubs, murmurs or gallops  Edema/varicosities:  Not grossly evident Abdominal  Soft,nontender, without masses, guarding or rebound.  Liver/spleen:  No organomegaly noted  Hernia:  None appreciated  Skin  Inspection:  Grossly normal   Breasts: Examined lying and sitting.   Right: Without masses, retractions, discharge or axillary adenopathy.   Left: Without masses, retractions, discharge or axillary adenopathy. Gentitourinary   Inguinal/mons:  Normal without inguinal adenopathy  External genitalia:  Normal  BUS/Urethra/Skene's glands:  Normal  Vagina:  Normal  Cervix:  Normal, IUD string visualized  Uterus:  Normal in size, shape and contour.  Midline and mobile  Adnexa/parametria:     Rt: Without masses or tenderness.   Lt: Without masses or tenderness.  Anus and perineum: Normal  Assessment/Plan:  39 y.o. G0 for annual exam.   Well female exam with routine gynecological exam - Education provided on SBEs, importance of preventative  screenings, current guidelines, high calcium diet, regular exercise, and multivitamin daily. Labs with PCP.   Encounter for routine checking of intrauterine contraceptive device (IUD) - Mirena inserted 02/2017, occasional spotting. String visualized in correct position during exam.   Screening for cervical cancer - CIN-1 years ago, unknown. Will repeat pap at 3-year interval.   Screening for breast cancer - Has not had screening mammogram. Discussed current guidelines and importance of preventative screenings. Information provided on [facility] and she plans to schedule soon. Normal breast exam today.   Follow up in 1 year for annual.        03/2017 [facility], 4:33 PM 02/29/2020

## 2020-02-29 NOTE — Patient Instructions (Addendum)
Schedule mammogram! [facility] [phone] [address]  , [place] [address]  Health Maintenance, Female Adopting a healthy lifestyle and getting preventive care are important in promoting health and wellness. Ask your health care provider about:  The right schedule for you to have regular tests and exams.  Things you can do on your own to prevent diseases and keep yourself healthy. What should I know about diet, weight, and exercise? Eat a healthy diet   Eat a diet that includes plenty of vegetables, fruits, low-fat dairy products, and lean protein.  Do not eat a lot of foods that are high in solid fats, added sugars, or sodium. Maintain a healthy weight Body mass index (BMI) is used to identify weight problems. It estimates body fat based on height and weight. Your health care provider can help determine your BMI and help you achieve or maintain a healthy weight. Get regular exercise Get regular exercise. This is one of the most important things you can do for your health. Most adults should:  Exercise for at least 150 minutes each week. The exercise should increase your heart rate and make you sweat (moderate-intensity exercise).  Do strengthening exercises at least twice a week. This is in addition to the moderate-intensity exercise.  Spend less time sitting. Even light physical activity can be beneficial. Watch cholesterol and blood lipids Have your blood tested for lipids and cholesterol at 41 years of age, then have this test every 5 years. Have your cholesterol levels checked more often if:  Your lipid or cholesterol levels are high.  You are older than 40 years of age.  You are at high risk for heart disease. What should I know about cancer screening? Depending on your health history and family history, you may need to have cancer screening at various ages. This may include screening for:  Breast cancer.  Cervical  cancer.  Colorectal cancer.  Skin cancer.  Lung cancer. What should I know about heart disease, diabetes, and high blood pressure? Blood pressure and heart disease  High blood pressure causes heart disease and increases the risk of stroke. This is more likely to develop in people who have high blood pressure readings, are of African descent, or are overweight.  Have your blood pressure checked: ? Every 3-5 years if you are 18-39 years of age. ? Every year if you are 40 years old or older. Diabetes Have regular diabetes screenings. This checks your fasting blood sugar level. Have the screening done:  Once every three years after age 40 if you are at a normal weight and have a low risk for diabetes.  More often and at a younger age if you are overweight or have a high risk for diabetes. What should I know about preventing infection? Hepatitis B If you have a higher risk for hepatitis B, you should be screened for this virus. Talk with your health care provider to find out if you are at risk for hepatitis B infection. Hepatitis C Testing is recommended for:  Everyone born from 1945 through 1965.  Anyone with known risk factors for hepatitis C. Sexually transmitted infections (STIs)  Get screened for STIs, including gonorrhea and chlamydia, if: ? You are sexually active and are younger than 41 years of age. ? You are older than 41 years of age and your health care provider tells you that you are at risk for this type of infection. ? Your sexual activity has changed since you   were last screened, and you are at increased risk for chlamydia or gonorrhea. Ask your health care provider if you are at risk.  Ask your health care provider about whether you are at high risk for HIV. Your health care provider may recommend a prescription medicine to help prevent HIV infection. If you choose to take medicine to prevent HIV, you should first get tested for HIV. You should then be tested every 3  months for as long as you are taking the medicine. Pregnancy  If you are about to stop having your period (premenopausal) and you may become pregnant, seek counseling before you get pregnant.  Take 400 to 800 micrograms (mcg) of folic acid every day if you become pregnant.  Ask for birth control (contraception) if you want to prevent pregnancy. Osteoporosis and menopause Osteoporosis is a disease in which the bones lose minerals and strength with aging. This can result in bone fractures. If you are 65 years old or older, or if you are at risk for osteoporosis and fractures, ask your health care provider if you should:  Be screened for bone loss.  Take a calcium or vitamin D supplement to lower your risk of fractures.  Be given hormone replacement therapy (HRT) to treat symptoms of menopause. Follow these instructions at home: Lifestyle  Do not use any products that contain nicotine or tobacco, such as cigarettes, e-cigarettes, and chewing tobacco. If you need help quitting, ask your health care provider.  Do not use street drugs.  Do not share needles.  Ask your health care provider for help if you need support or information about quitting drugs. Alcohol use  Do not drink alcohol if: ? Your health care provider tells you not to drink. ? You are pregnant, may be pregnant, or are planning to become pregnant.  If you drink alcohol: ? Limit how much you use to 0-1 drink a day. ? Limit intake if you are breastfeeding.  Be aware of how much alcohol is in your drink. In the [place] one drink equals one 12 oz bottle of beer (355 mL), one 5 oz glass of wine (148 mL), or one 1 oz glass of hard liquor (44 mL). General instructions  Schedule regular health, dental, and eye exams.  Stay current with your vaccines.  Tell your health care provider if: ? You often feel depressed. ? You have ever been abused or do not feel safe at home. Summary  Adopting a healthy lifestyle and getting  preventive care are important in promoting health and wellness.  Follow your health care provider's instructions about healthy diet, exercising, and getting tested or screened for diseases.  Follow your health care provider's instructions on monitoring your cholesterol and blood pressure. This information is not intended to replace advice given to you by your health care provider. Make sure you discuss any questions you have with your health care provider. Document Revised: 02/24/2018 Document Reviewed: 02/24/2018 Elsevier Patient Education  2020 Elsevier Inc.  

## 2020-05-21 ENCOUNTER — Other Ambulatory Visit (INDEPENDENT_AMBULATORY_CARE_PROVIDER_SITE_OTHER): Payer: Managed Care, Other (non HMO)

## 2020-05-21 ENCOUNTER — Encounter: Payer: Self-pay | Admitting: Nurse Practitioner

## 2020-05-21 ENCOUNTER — Other Ambulatory Visit: Payer: Self-pay

## 2020-05-21 ENCOUNTER — Ambulatory Visit: Payer: Managed Care, Other (non HMO) | Admitting: Nurse Practitioner

## 2020-05-21 VITALS — BP 130/78

## 2020-05-21 DIAGNOSIS — N9089 Other specified noninflammatory disorders of vulva and perineum: Secondary | ICD-10-CM

## 2020-05-21 DIAGNOSIS — Z113 Encounter for screening for infections with a predominantly sexual mode of transmission: Secondary | ICD-10-CM

## 2020-05-21 LAB — TIQ-NTM

## 2020-05-21 NOTE — Progress Notes (Signed)
° °  Acute Office Visit  Subjective:    Patient ID: Abigail Higgins, female    DOB: Jun 03, 1980, 40 y.o.   MRN: [id number]   HPI 40 y.o. presents today for vulvar lesion that she noticed 2 days ago. She is doing laser hair removal treatments with most recent 2 days ago, that night was when she noticed the sore. It is mildly tender. She has never had a reaction or burn with treatments before. She had a similar sore a couple of years ago that was cultured and negative for HSV. History of an open marriage 2-3 years ago. She would like full STD screening today.    Review of Systems  Constitutional: Negative.   Genitourinary: Positive for genital sores.       Objective:    Physical Exam Constitutional:      Appearance: Normal appearance.  Genitourinary:    Vagina: Normal.       BP 130/78  Wt Readings from Last 3 Encounters:  02/29/20 211 lb (95.7 kg)  02/16/19 180 lb (81.6 kg)  02/08/18 184 lb (83.5 kg)        Assessment & Plan:   Problem List Items Addressed This Visit   None   Visit Diagnoses    Screen for STD (sexually transmitted disease)    -  Primary   Relevant Orders   SURESWAB CT/NG/T. vaginalis   RPR   HIV Antibody (routine testing w rflx)   Vulvar lesion       Relevant Orders   RPR     Plan: HSV culture obtained. GC/chlamydia/Trich, HIV, RPR pending. We will call with results. She is agreeable to plan.      Olivia Mackie [facility], 9:25 AM 05/21/2020

## 2020-05-21 NOTE — Addendum Note (Signed)
Addended by: Berna Spare A on: 05/21/2020 11:02 AM   Modules accepted: Orders

## 2020-05-22 LAB — RPR: RPR Ser Ql: NONREACTIVE

## 2020-05-22 LAB — HIV ANTIBODY (ROUTINE TESTING W REFLEX): HIV 1&2 Ab, 4th Generation: NONREACTIVE

## 2020-05-23 LAB — SURESWAB CT/NG/T. VAGINALIS
C. trachomatis RNA, TMA: NOT DETECTED
N. gonorrhoeae RNA, TMA: NOT DETECTED
Trichomonas vaginalis RNA: NOT DETECTED

## 2020-05-25 LAB — SURESWAB HSV, TYPE 1/2 DNA, PCR
HSV 1 DNA: NOT DETECTED
HSV 2 DNA: NOT DETECTED

## 2020-06-27 DIAGNOSIS — F4323 Adjustment disorder with mixed anxiety and depressed mood: Secondary | ICD-10-CM | POA: Diagnosis not present

## 2020-07-02 ENCOUNTER — Ambulatory Visit (INDEPENDENT_AMBULATORY_CARE_PROVIDER_SITE_OTHER): Payer: BC Managed Care – PPO | Admitting: Psychiatry

## 2020-07-02 ENCOUNTER — Other Ambulatory Visit: Payer: Self-pay

## 2020-07-02 DIAGNOSIS — R200 Anesthesia of skin: Secondary | ICD-10-CM | POA: Diagnosis not present

## 2020-07-02 DIAGNOSIS — F319 Bipolar disorder, unspecified: Secondary | ICD-10-CM

## 2020-07-02 NOTE — Progress Notes (Signed)
Virtual Visit via Telephone Note  I connected with Abigail Higgins on 07/02/20 at  4:00 PM EDT by telephone and verified that I am speaking with the correct person using two identifiers.  Location: Patient: Abigail Higgins Provider: Hospital   I discussed the limitations, risks, security and privacy concerns of performing an evaluation and management service by telephone and the availability of Abigail person appointments. I also discussed with the patient that there may be a patient responsible charge related to this service. The patient expressed understanding and agreed to proceed.   History of Present Illness: This is a ECT consultation visit scheduled for this 40 year old woman referred by her outpatient psychiatrist, Dr. Therese Sarah.  Patient was reached by telephone.  She asked if it was okay if her Higgins sat Abigail on the appointment as well which I said was fine with me if it helped her.  Patient reports that currently she feels like her symptoms of depression anxiety and anger have been worse than average.  She feels down and negative most of the time.  Has frequent feelings of nervousness and irritability.  Her energy level is impaired.  She feels little interest or motivation to do things.  She says she sleeps well given her current medication and her appetite is always good.  She has occasional suicidal fantasies but says that she is certain she will not act on it.  She has spoken about this with her current providers and family and made specific promises not to act on it and feels that she would not do so because of her family particularly her Higgins and parents.  She denies any homicidal ideation.  She is currently compliant with her medicine regimen of lithium 900 mg at night, Seroquel 600 mg at night, Xanax 1 mg every 6 hours as needed for anxiety and Vyvanse.  Patient denies the use of alcohol or any other recreational drugs.  Patient reports her symptoms of depression and  anxiety have been present since she was about 40 years old.  Her only hospitalization was as an adolescent.  She denies ever having tried to kill herself Abigail the past.  She has been on multiple medications including those she is taking now as well as olanzapine, Geodon, doxepin, Viibryd.  She cannot remember all the others.  She does not feel like any medicine has really consistently helped.  Patient feels that she is significantly impaired although she is able to work.  Patient is married.  No children.  Works as a Arboriculturist.  Adopted.  Knows nothing about the history of her biological relatives.  Patient was appropriate on time and expecting the call.  Affect was slightly anxious but controlled and appropriate.  Mood was stated as depressed and anxious and somewhat angry but only Abigail a general sense.  Denied suicidal or homicidal thoughts.  Denied any hallucinations.  Patient was alert and oriented and was able to understand the conversation around the treatment and the plan and ask appropriate questions.  Patient has minimal other medical problems outside of the psychiatric.  Chronic headaches for which she is not taking any specific medicine right now.  Denies any history of drug abuse.  This is a 40 year old woman with multiple symptoms of severe depression and anxiety.  She has been diagnosed with bipolar disorder although she does not describe anything that sounds like a typical type I manic episode leading me to suspect that this is either bipolar 2 or chronic  depression with anxiety.  Multiple medications have been tried without effect and she continues to be severely impaired Abigail her day-to-day life.  Based on this the patient is a appropriate candidate for electroconvulsive therapy.  Spent time describing ECT Abigail general and the specifics of our treatment program.  Went over the potential benefits and also the acknowledgment that the treatment is not perfect and may not work.  Reviewed side effects  including pain, cardiac arrhythmias, nausea, short-term memory impairment.  Patient and Higgins both asked appropriate questions.  Ultimately patient states she would like to pursue electroconvulsive therapy as an option.  I will be forwarding her information to ECT team to schedule labs and appointment.  Patient advised that I would like her to stop taking lithium about 3 days before we begin treatment and that Xanax should be limited as much as possible and definitely not taken the night before or morning of a treatment.  She expressed understanding.    Observations/Objective:   Assessment and Plan:   Follow Up Instructions:    I discussed the assessment and treatment plan with the patient. The patient was provided an opportunity to ask questions and all were answered. The patient agreed with the plan and demonstrated an understanding of the instructions.   The patient was advised to call back or seek an Abigail-person evaluation if the symptoms worsen or if the condition fails to improve as anticipated.  I provided 60 minutes of non-face-to-face time during this encounter.   Mordecai Rasmussen, MD

## 2020-07-03 ENCOUNTER — Other Ambulatory Visit: Payer: Self-pay | Admitting: Psychiatry

## 2020-07-04 DIAGNOSIS — F4323 Adjustment disorder with mixed anxiety and depressed mood: Secondary | ICD-10-CM | POA: Diagnosis not present

## 2020-07-06 ENCOUNTER — Encounter
Admission: RE | Admit: 2020-07-06 | Discharge: 2020-07-06 | Disposition: A | Discharge status: Home / Self Care | Payer: BC Managed Care – PPO | Source: Ambulatory Visit | Attending: Psychiatry | Admitting: Psychiatry

## 2020-07-06 ENCOUNTER — Other Ambulatory Visit: Payer: Self-pay

## 2020-07-06 DIAGNOSIS — Z01818 Encounter for other preprocedural examination: Secondary | ICD-10-CM | POA: Diagnosis not present

## 2020-07-06 DIAGNOSIS — Z01810 Encounter for preprocedural cardiovascular examination: Secondary | ICD-10-CM | POA: Diagnosis not present

## 2020-07-06 LAB — URINALYSIS, ROUTINE W REFLEX MICROSCOPIC
Bilirubin Urine: NEGATIVE
Glucose, UA: NEGATIVE mg/dL
Hgb urine dipstick: NEGATIVE
Ketones, ur: NEGATIVE mg/dL
Leukocytes,Ua: NEGATIVE
Nitrite: NEGATIVE
Protein, ur: NEGATIVE mg/dL
Specific Gravity, Urine: 1.005 (ref 1.005–1.030)
pH: 7 (ref 5.0–8.0)

## 2020-07-06 LAB — CBC
HCT: 42.6 % (ref 36.0–46.0)
Hemoglobin: 14.2 g/dL (ref 12.0–15.0)
MCH: 31.2 pg (ref 26.0–34.0)
MCHC: 33.3 g/dL (ref 30.0–36.0)
MCV: 93.6 fL (ref 80.0–100.0)
Platelets: 310 10*3/uL (ref 150–400)
RBC: 4.55 MIL/uL (ref 3.87–5.11)
RDW: 13.7 % (ref 11.5–15.5)
WBC: 7.1 10*3/uL (ref 4.0–10.5)
nRBC: 0 % (ref 0.0–0.2)

## 2020-07-06 LAB — COMPREHENSIVE METABOLIC PANEL
ALT: 19 U/L (ref 0–44)
AST: 24 U/L (ref 15–41)
Albumin: 4.8 g/dL (ref 3.5–5.0)
Alkaline Phosphatase: 73 U/L (ref 38–126)
Anion gap: 9 (ref 5–15)
BUN: 12 mg/dL (ref 6–20)
CO2: 26 mmol/L (ref 22–32)
Calcium: 9.4 mg/dL (ref 8.9–10.3)
Chloride: 105 mmol/L (ref 98–111)
Creatinine, Ser: 0.81 mg/dL (ref 0.44–1.00)
GFR, Estimated: >60 mL/min (ref >60)
Glucose, Bld: 91 mg/dL (ref 70–99)
Potassium: 3.7 mmol/L (ref 3.5–5.1)
Sodium: 140 mmol/L (ref 135–145)
Total Bilirubin: 1.4 mg/dL — ABNORMAL HIGH (ref 0.3–1.2)
Total Protein: 8 g/dL (ref 6.5–8.1)

## 2020-07-10 ENCOUNTER — Other Ambulatory Visit: Payer: Self-pay

## 2020-07-10 ENCOUNTER — Encounter
Admission: RE | Admit: 2020-07-10 | Discharge: 2020-07-10 | Disposition: A | Discharge status: Home / Self Care | Payer: PRIVATE HEALTH INSURANCE | Source: Ambulatory Visit | Attending: Psychiatry | Admitting: Psychiatry

## 2020-07-10 NOTE — Pre-Procedure Instructions (Signed)
Reviewed medications and history.

## 2020-07-15 ENCOUNTER — Other Ambulatory Visit: Payer: Self-pay | Admitting: Psychiatry

## 2020-07-16 ENCOUNTER — Encounter
Admission: RE | Admit: 2020-07-16 | Discharge: 2020-07-16 | Disposition: A | Discharge status: Home / Self Care | Payer: BC Managed Care – PPO | Source: Ambulatory Visit | Attending: Psychiatry | Admitting: Psychiatry

## 2020-07-16 ENCOUNTER — Encounter: Payer: Self-pay | Admitting: Anesthesiology

## 2020-07-16 ENCOUNTER — Other Ambulatory Visit: Payer: Self-pay

## 2020-07-16 DIAGNOSIS — F332 Major depressive disorder, recurrent severe without psychotic features: Secondary | ICD-10-CM

## 2020-07-16 DIAGNOSIS — F333 Major depressive disorder, recurrent, severe with psychotic symptoms: Secondary | ICD-10-CM | POA: Diagnosis not present

## 2020-07-16 MED ORDER — GLYCOPYRROLATE 0.2 MG/ML IJ SOLN: 0.1000 mg | Freq: Once | INTRAMUSCULAR | Status: AC

## 2020-07-16 MED ORDER — MIDAZOLAM HCL 2 MG/2ML IJ SOLN: 4.0000 mg | Freq: Once | INTRAMUSCULAR | Status: AC

## 2020-07-16 MED ORDER — MIDAZOLAM HCL 2 MG/2ML IJ SOLN
INTRAMUSCULAR | Status: DC | PRN
  Administered 2020-07-16: 2 mg via INTRAVENOUS

## 2020-07-16 MED ORDER — MIDAZOLAM HCL 2 MG/2ML IJ SOLN
INTRAMUSCULAR | Status: AC
  Administered 2020-07-16: 4 mg via INTRAVENOUS
  Filled 2020-07-16: qty 4

## 2020-07-16 MED ORDER — SODIUM CHLORIDE 0.9 % IV SOLN
500.0000 mL | Freq: Once | INTRAVENOUS | Status: AC
  Administered 2020-07-16: 500 mL via INTRAVENOUS

## 2020-07-16 MED ORDER — SODIUM CHLORIDE 0.9 % IV SOLN: INTRAVENOUS | Status: DC | PRN

## 2020-07-16 MED ORDER — MIDAZOLAM HCL 2 MG/2ML IJ SOLN
INTRAMUSCULAR | Status: AC
  Filled 2020-07-16: qty 2

## 2020-07-16 MED ORDER — MIDAZOLAM HCL 2 MG/2ML IJ SOLN: 2.0000 mg | Freq: Once | INTRAMUSCULAR | Status: DC

## 2020-07-16 MED ORDER — GLYCOPYRROLATE 0.2 MG/ML IJ SOLN
INTRAMUSCULAR | Status: AC
  Administered 2020-07-16: 0.1 mg via INTRAVENOUS
  Filled 2020-07-16: qty 1

## 2020-07-16 MED ORDER — METHOHEXITAL SODIUM 100 MG/10ML IV SOSY
PREFILLED_SYRINGE | INTRAVENOUS | Status: DC | PRN
  Administered 2020-07-16: 90 mg via INTRAVENOUS

## 2020-07-16 MED ORDER — SUCCINYLCHOLINE CHLORIDE 20 MG/ML IJ SOLN
INTRAMUSCULAR | Status: DC | PRN
  Administered 2020-07-16: 110 mg via INTRAVENOUS

## 2020-07-16 NOTE — Procedures (Signed)
ECT SERVICES Physician's Interval Evaluation & Treatment Note  Patient Identification: Abigail Higgins MRN:  [id number] Date of Evaluation:  07/16/2020 TX #: 1  MADRS:   MMSE:   P.E. Findings:  normal  Psychiatric Interval Note:  Chronic anxiety and depression  Subjective:  Patient is a 40 y.o. female seen for evaluation for Electroconvulsive Therapy. Anxious and tearful  Treatment Summary:   [x]   Right Unilateral             []  Bilateral   % Energy : 0.57ms, 45%    Impedance: 1970 ohms  Seizure Energy Index: 6459 mv2  Postictal Suppression Index: 71%  Seizure Concordance Index: 88%  Medications  Pre Shock: brevital 90mg  succinylcholine 110mg  robinol 0.1mg   Post Shock: versed 2mg  plus 4mg   Seizure Duration: 24s emg 54s eeg   Comments: Very agitated awakening needs increased post treatment medication   Lungs:  [x]   Clear to auscultation               []  Other:   Heart:    [x]   Regular rhythm             []  irregular rhythm    [x]   Previous H&P reviewed, patient examined and there are NO CHANGES                 []   Previous H&P reviewed, patient examined and there are changes noted.   , MD 5/2/20227:57 PM

## 2020-07-16 NOTE — Transfer of Care (Signed)
Immediate Anesthesia Transfer of Care Note  Patient: Abigail Higgins  Procedure(s) Performed: ECT TX  Patient Location: PACU  Anesthesia Type:General  Level of Consciousness: sedated  Airway & Oxygen Therapy: Patient connected to face mask oxygen  Post-op Assessment: Post -op Vital signs reviewed and stable  Post vital signs: stable  Last Vitals:  Vitals Value Taken Time  BP 131/98 07/16/20 1325  Temp    Pulse 105 07/16/20 1326  Resp 29 07/16/20 1326  SpO2 100 % 07/16/20 1326  Vitals shown include unvalidated device data.  Last Pain:  Vitals:   07/16/20 1114  TempSrc:   PainSc: 0-No pain         Complications: No complications documented.

## 2020-07-16 NOTE — Anesthesia Procedure Notes (Signed)
Performed by: Yovany Clock, CRNA Pre-anesthesia Checklist: Patient identified, Emergency Drugs available, Suction available and Patient being monitored Patient Re-evaluated:Patient Re-evaluated prior to induction Oxygen Delivery Method: Circle system utilized Preoxygenation: Pre-oxygenation with 100% oxygen Induction Type: IV induction Ventilation: Mask ventilation without difficulty and Mask ventilation throughout procedure Airway Equipment and Method: Bite block Placement Confirmation: positive ETCO2 Dental Injury: Teeth and Oropharynx as per pre-operative assessment        

## 2020-07-16 NOTE — Anesthesia Postprocedure Evaluation (Signed)
Anesthesia Post Note  Patient: Abigail Higgins  Procedure(s) Performed: ECT TX  Patient location during evaluation: PACU Anesthesia Type: General Level of consciousness: awake and alert Pain management: pain level controlled Vital Signs Assessment: post-procedure vital signs reviewed and stable Respiratory status: spontaneous breathing, nonlabored ventilation, respiratory function stable and patient connected to nasal cannula oxygen Cardiovascular status: blood pressure returned to baseline and stable Postop Assessment: no apparent nausea or vomiting Anesthetic complications: no   No complications documented.   Last Vitals:  Vitals:   07/16/20 1343 07/16/20 1400  BP: (!) 129/98 131/88  Pulse: (!) 102 93  Resp: 19 13  Temp:    SpO2: 99% 94%    Last Pain:  Vitals:   07/16/20 1407  TempSrc:   PainSc: 0-No pain                 Cleda Mccreedy Lane Kjos

## 2020-07-16 NOTE — Anesthesia Preprocedure Evaluation (Signed)
Anesthesia Evaluation  Patient identified by MRN, date of birth, ID band Patient awake    Reviewed: Allergy & Precautions, H&P , NPO status , Patient's Chart, lab work & pertinent test results  History of Anesthesia Complications Negative for: history of anesthetic complications  Airway Mallampati: III  TM Distance: <3 FB Neck ROM: full    Dental  (+) Chipped   Pulmonary neg shortness of breath, former smoker,    Pulmonary exam normal        Cardiovascular Exercise Tolerance: Good (-) angina(-) Past MI and (-) DOE Normal cardiovascular exam     Neuro/Psych  Headaches, PSYCHIATRIC DISORDERS negative psych ROS   GI/Hepatic Neg liver ROS, GERD  Medicated and Controlled,  Endo/Other  negative endocrine ROS  Renal/GU negative Renal ROS  negative genitourinary   Musculoskeletal   Abdominal   Peds  Hematology negative hematology ROS (+)   Anesthesia Other Findings Past Medical History: No date: ADD (attention deficit disorder) No date: Anxiety No date: CIN I (cervical intraepithelial neoplasia I) No date: Depression No date: IC (interstitial cystitis) WATCHES DIET: Mild acid reflux URETHRAL SPRAYING: Urethral disorder  Past Surgical History: No date: COLPOSCOPY 08/02/2012: CYSTOSCOPY WITH URETHRAL DILATATION; N/A     Comment:  Procedure: CYSTOSCOPY WITH URETHRAL DILATATION WITH               INSTILLATION OF PYRIDIUM AND PELVIC EXAM;  Surgeon:               Kathi Ludwig, MD;  Location: [facility];  Service: Urology;  Laterality: N/A; 03/05/2017: INTRAUTERINE DEVICE INSERTION     Comment:  MIRENA AGE 62: RHINOPLASTY AGE 50: TONSILLECTOMY  BMI    Body Mass Index: 33.28 kg/m      Reproductive/Obstetrics negative OB ROS                             Anesthesia Physical Anesthesia Plan  ASA: III  Anesthesia Plan: General   Post-op Pain  Management:    Induction: Intravenous  PONV Risk Score and Plan: TIVA  Airway Management Planned: Mask  Additional Equipment:   Intra-op Plan:   Post-operative Plan:   Informed Consent: I have reviewed the patients History and Physical, chart, labs and discussed the procedure including the risks, benefits and alternatives for the proposed anesthesia with the patient or authorized representative who has indicated his/her understanding and acceptance.     Dental Advisory Given  Plan Discussed with: Anesthesiologist, CRNA and Surgeon  Anesthesia Plan Comments: (Patient consented for risks of anesthesia including but not limited to:  - adverse reactions to medications - risk of airway placement if required - damage to eyes, teeth, lips or other oral mucosa - nerve damage due to positioning  - sore throat or hoarseness - Damage to heart, brain, nerves, lungs, other parts of body or loss of life  Patient voiced understanding.)        Anesthesia Quick Evaluation

## 2020-07-16 NOTE — H&P (Signed)
Abigail Higgins is an 40 y.o. female.   Chief Complaint: Patient with severe recurrent depression.  See previous consultation note.  No change in complaints.  Denies suicidal thought HPI: History of longstanding depression with only partial response to medication  Past Medical History:  Diagnosis Date  . ADD (attention deficit disorder)   . Anxiety   . CIN I (cervical intraepithelial neoplasia I)   . Depression   . IC (interstitial cystitis)   . Mild acid reflux WATCHES DIET  . Urethral disorder URETHRAL SPRAYING    Past Surgical History:  Procedure Laterality Date  . COLPOSCOPY    . CYSTOSCOPY WITH URETHRAL DILATATION N/A 08/02/2012   Procedure: CYSTOSCOPY WITH URETHRAL DILATATION WITH INSTILLATION OF PYRIDIUM AND PELVIC EXAM;  Surgeon: Kathi Ludwig, MD;  Location: [facility];  Service: Urology;  Laterality: N/A;  . INTRAUTERINE DEVICE INSERTION  03/05/2017   MIRENA  . RHINOPLASTY  AGE 19  . TONSILLECTOMY  AGE 48    Family History  Adopted: Yes   Social History:  reports that she quit smoking about 5 years ago. Her smoking use included cigarettes. She has a 3.50 pack-year smoking history. She has never used smokeless tobacco. She reports that she does not drink alcohol and does not use drugs.  Allergies: No Known Allergies  (Not in a hospital admission)   No results found for this or any previous visit (from the past 48 hour(s)). No results found.  Review of Systems  Constitutional: Negative.   HENT: Negative.   Eyes: Negative.   Respiratory: Negative.   Cardiovascular: Negative.   Gastrointestinal: Negative.   Musculoskeletal: Negative.   Skin: Negative.   Neurological: Negative.   Psychiatric/Behavioral: Negative.     Blood pressure 131/88, pulse 93, temperature 98.1 F (36.7 C), resp. rate 13, height 5\' 5"  (1.651 m), weight 90.7 kg, SpO2 94 %. Physical Exam Constitutional:      Appearance: She is well-developed.  HENT:     Head:  Normocephalic and atraumatic.  Eyes:     Conjunctiva/sclera: Conjunctivae normal.     Pupils: Pupils are equal, round, and reactive to light.  Cardiovascular:     Heart sounds: Normal heart sounds.  Pulmonary:     Effort: Pulmonary effort is normal.  Abdominal:     Palpations: Abdomen is soft.  Musculoskeletal:        General: Normal range of motion.     Cervical back: Normal range of motion.  Skin:    General: Skin is warm and dry.  Neurological:     Mental Status: She is alert.  Psychiatric:        Attention and Perception: Attention normal.        Mood and Affect: Mood is depressed. Affect is tearful.        Speech: Speech is delayed.        Behavior: Behavior is slowed.        Thought Content: Thought content does not include homicidal or suicidal ideation.        Cognition and Memory: Cognition normal.        Judgment: Judgment normal.      Assessment/Plan Initiating ECT index course with right unilateral treatment today planning on right unilateral treatment 3 times a week going forward.  , MD 07/16/2020, 7:50 PM

## 2020-07-17 ENCOUNTER — Other Ambulatory Visit: Payer: Self-pay | Admitting: Psychiatry

## 2020-07-18 ENCOUNTER — Other Ambulatory Visit: Payer: Self-pay

## 2020-07-18 ENCOUNTER — Encounter (HOSPITAL_COMMUNITY)
Admission: RE | Admit: 2020-07-18 | Discharge: 2020-07-18 | Disposition: A | Discharge status: Home / Self Care | Payer: BC Managed Care – PPO | Source: Ambulatory Visit | Attending: Psychiatry | Admitting: Psychiatry

## 2020-07-18 ENCOUNTER — Encounter: Payer: Self-pay | Admitting: Anesthesiology

## 2020-07-18 DIAGNOSIS — F332 Major depressive disorder, recurrent severe without psychotic features: Secondary | ICD-10-CM

## 2020-07-18 DIAGNOSIS — R000 Tachycardia, unspecified: Secondary | ICD-10-CM | POA: Diagnosis not present

## 2020-07-18 DIAGNOSIS — F333 Major depressive disorder, recurrent, severe with psychotic symptoms: Secondary | ICD-10-CM | POA: Diagnosis not present

## 2020-07-18 DIAGNOSIS — E785 Hyperlipidemia, unspecified: Secondary | ICD-10-CM | POA: Diagnosis not present

## 2020-07-18 MED ORDER — SODIUM CHLORIDE 0.9 % IV SOLN
500.0000 mL | Freq: Once | INTRAVENOUS | Status: AC
  Administered 2020-07-18: 500 mL via INTRAVENOUS

## 2020-07-18 MED ORDER — HALOPERIDOL LACTATE 5 MG/ML IJ SOLN
INTRAMUSCULAR | Status: AC
  Filled 2020-07-18: qty 1

## 2020-07-18 MED ORDER — SODIUM CHLORIDE 0.9 % IV SOLN: INTRAVENOUS | Status: DC | PRN

## 2020-07-18 MED ORDER — METHOHEXITAL SODIUM 100 MG/10ML IV SOSY
PREFILLED_SYRINGE | INTRAVENOUS | Status: DC | PRN
  Administered 2020-07-18: 90 mg via INTRAVENOUS

## 2020-07-18 MED ORDER — KETOROLAC TROMETHAMINE 30 MG/ML IJ SOLN: 30.0000 mg | Freq: Once | INTRAMUSCULAR | Status: AC

## 2020-07-18 MED ORDER — SUCCINYLCHOLINE CHLORIDE 20 MG/ML IJ SOLN
INTRAMUSCULAR | Status: DC | PRN
  Administered 2020-07-18: 110 mg via INTRAVENOUS

## 2020-07-18 MED ORDER — MIDAZOLAM HCL 2 MG/2ML IJ SOLN
6.0000 mg | Freq: Once | INTRAMUSCULAR | Status: AC
  Administered 2020-07-18: 6 mg via INTRAVENOUS

## 2020-07-18 MED ORDER — GLYCOPYRROLATE 0.2 MG/ML IJ SOLN
INTRAMUSCULAR | Status: AC
  Administered 2020-07-18: 0.1 mg via INTRAVENOUS
  Filled 2020-07-18: qty 1

## 2020-07-18 MED ORDER — MIDAZOLAM HCL 2 MG/2ML IJ SOLN
INTRAMUSCULAR | Status: AC
  Filled 2020-07-18: qty 6

## 2020-07-18 MED ORDER — SUCCINYLCHOLINE CHLORIDE 200 MG/10ML IV SOSY
PREFILLED_SYRINGE | INTRAVENOUS | Status: AC
  Filled 2020-07-18: qty 10

## 2020-07-18 MED ORDER — HALOPERIDOL LACTATE 5 MG/ML IJ SOLN
5.0000 mg | Freq: Once | INTRAMUSCULAR | Status: AC
  Administered 2020-07-18: 5 mg via INTRAVENOUS

## 2020-07-18 MED ORDER — KETOROLAC TROMETHAMINE 30 MG/ML IJ SOLN
INTRAMUSCULAR | Status: AC
  Administered 2020-07-18: 30 mg via INTRAVENOUS
  Filled 2020-07-18: qty 1

## 2020-07-18 MED ORDER — GLYCOPYRROLATE 0.2 MG/ML IJ SOLN: 0.1000 mg | Freq: Once | INTRAMUSCULAR | Status: AC

## 2020-07-18 MED ORDER — DEXMEDETOMIDINE (PRECEDEX) IN NS 20 MCG/5ML (4 MCG/ML) IV SYRINGE
PREFILLED_SYRINGE | INTRAVENOUS | Status: DC | PRN
  Administered 2020-07-18: 12 ug via INTRAVENOUS

## 2020-07-18 NOTE — Progress Notes (Signed)
Pattient wakes to moaning, fighting at staff, Dr Era Bumpers at bedside, Haldol given

## 2020-07-18 NOTE — Anesthesia Postprocedure Evaluation (Signed)
Anesthesia Post Note  Patient: Abigail Higgins  Procedure(s) Performed: ECT TX  Patient location during evaluation: PACU Anesthesia Type: General Level of consciousness: awake and alert Pain management: pain level controlled Vital Signs Assessment: post-procedure vital signs reviewed and stable Respiratory status: spontaneous breathing, nonlabored ventilation, respiratory function stable and patient connected to nasal cannula oxygen Cardiovascular status: blood pressure returned to baseline and stable Postop Assessment: no apparent nausea or vomiting Anesthetic complications: no   No complications documented.   Last Vitals:  Vitals:   07/18/20 1328 07/18/20 1330  BP: (!) 137/102 (!) 145/100  Pulse: 89 93  Resp: (!) 21   Temp: (!) 36.1 C   SpO2: 96% 97%    Last Pain:  Vitals:   07/18/20 1328  TempSrc:   PainSc: 0-No pain                 Lenard Simmer

## 2020-07-18 NOTE — Anesthesia Procedure Notes (Signed)
Procedure Name: General with mask airway Date/Time: 07/18/2020 12:50 PM Performed by: Irving Burton, CRNA Pre-anesthesia Checklist: Patient identified, Emergency Drugs available, Suction available and Patient being monitored Patient Re-evaluated:Patient Re-evaluated prior to induction Oxygen Delivery Method: Circle system utilized Preoxygenation: Pre-oxygenation with 100% oxygen Induction Type: IV induction Ventilation: Mask ventilation without difficulty and Mask ventilation throughout procedure Airway Equipment and Method: Bite block Placement Confirmation: positive ETCO2 Dental Injury: Teeth and Oropharynx as per pre-operative assessment

## 2020-07-18 NOTE — Procedures (Signed)
ECT SERVICES Physician's Interval Evaluation & Treatment Note  Patient Identification: MADALYNNE GUTMANN MRN:  [id number] Date of Evaluation:  07/18/2020 TX #: 2  MADRS:   MMSE:   P.E. Findings:  normal  Psychiatric Interval Note:  blunted  Subjective:  Patient is a 40 y.o. female seen for evaluation for Electroconvulsive Therapy. No change  Treatment Summary:   []   Right Unilateral             []  Bilateral   % Energy : 0.65ms 45%   Impedance: 1620 ohms  Seizure Energy Index: 10747mv2  Postictal Suppression Index: 88%  Seizure Concordance Index: 98%  Medications  Pre Shock: robinol 0.1mg , brevital 90mg , suc 110mg   Post Shock: versed 6mg   Seizure Duration: 23s emg 42s eeg   Comments: Still woke up rough   Lungs:  [x]   Clear to auscultation               []  Other:   Heart:    [x]   Regular rhythm             []  irregular rhythm    [x]   Previous H&P reviewed, patient examined and there are NO CHANGES                 []   Previous H&P reviewed, patient examined and there are changes noted.   1m, MD 5/4/20228:27 PM

## 2020-07-18 NOTE — Transfer of Care (Signed)
Immediate Anesthesia Transfer of Care Note  Patient: Abigail Higgins  Procedure(s) Performed: ECT TX  Patient Location: PACU  Anesthesia Type:General  Level of Consciousness: awake  Airway & Oxygen Therapy: Patient Spontanous Breathing  Post-op Assessment: Post -op Vital signs reviewed and stable  Post vital signs: stable  Last Vitals:  Vitals Value Taken Time  BP 156/98 07/18/20 1308  Temp 36.1 C 07/18/20 1300  Pulse 100 07/18/20 1313  Resp 22 07/18/20 1313  SpO2 95 % 07/18/20 1313  Vitals shown include unvalidated device data.  Last Pain:  Vitals:   07/18/20 1044  TempSrc: Oral  PainSc: 0-No pain         Complications: No complications documented.

## 2020-07-18 NOTE — Anesthesia Preprocedure Evaluation (Signed)
Anesthesia Evaluation  Patient identified by MRN, date of birth, ID band Patient awake    Reviewed: Allergy & Precautions, H&P , NPO status , Patient's Chart, lab work & pertinent test results  History of Anesthesia Complications Negative for: history of anesthetic complications  Airway Mallampati: III  TM Distance: <3 FB Neck ROM: full    Dental  (+) Chipped   Pulmonary neg shortness of breath, former smoker,    Pulmonary exam normal        Cardiovascular Exercise Tolerance: Good (-) angina(-) Past MI and (-) DOE Normal cardiovascular exam     Neuro/Psych  Headaches, PSYCHIATRIC DISORDERS negative psych ROS   GI/Hepatic Neg liver ROS, GERD  Medicated and Controlled,  Endo/Other  negative endocrine ROS  Renal/GU negative Renal ROS  negative genitourinary   Musculoskeletal   Abdominal   Peds  Hematology negative hematology ROS (+)   Anesthesia Other Findings Past Medical History: No date: ADD (attention deficit disorder) No date: Anxiety No date: CIN I (cervical intraepithelial neoplasia I) No date: Depression No date: IC (interstitial cystitis) WATCHES DIET: Mild acid reflux URETHRAL SPRAYING: Urethral disorder  Past Surgical History: No date: COLPOSCOPY 08/02/2012: CYSTOSCOPY WITH URETHRAL DILATATION; N/A     Comment:  Procedure: CYSTOSCOPY WITH URETHRAL DILATATION WITH               INSTILLATION OF PYRIDIUM AND PELVIC EXAM;  Surgeon:               Sigmund I Tannenbaum, MD;  Location: [facility];  Service: Urology;  Laterality: N/A; 03/05/2017: INTRAUTERINE DEVICE INSERTION     Comment:  MIRENA AGE 14: RHINOPLASTY AGE 11: TONSILLECTOMY  BMI    Body Mass Index: 33.28 kg/m      Reproductive/Obstetrics negative OB ROS                             Anesthesia Physical Anesthesia Plan  ASA: III  Anesthesia Plan: General   Post-op Pain  Management:    Induction: Intravenous  PONV Risk Score and Plan: TIVA  Airway Management Planned: Mask  Additional Equipment:   Intra-op Plan:   Post-operative Plan:   Informed Consent: I have reviewed the patients History and Physical, chart, labs and discussed the procedure including the risks, benefits and alternatives for the proposed anesthesia with the patient or authorized representative who has indicated his/her understanding and acceptance.     Dental Advisory Given  Plan Discussed with: Anesthesiologist, CRNA and Surgeon  Anesthesia Plan Comments: (Patient consented for risks of anesthesia including but not limited to:  - adverse reactions to medications - risk of airway placement if required - damage to eyes, teeth, lips or other oral mucosa - nerve damage due to positioning  - sore throat or hoarseness - Damage to heart, brain, nerves, lungs, other parts of body or loss of life  Patient voiced understanding.)        Anesthesia Quick Evaluation  

## 2020-07-18 NOTE — H&P (Signed)
Abigail Higgins is an 40 y.o. female.   Chief Complaint: Patient continues to report chronic anxiety depression blunted affect.  No psychotic symptoms no suicidal ideation HPI: History of chronic depression beginning ECT.  Last treatment had some agitation and awakening  Past Medical History:  Diagnosis Date  . ADD (attention deficit disorder)   . Anxiety   . CIN I (cervical intraepithelial neoplasia I)   . Depression   . IC (interstitial cystitis)   . Mild acid reflux WATCHES DIET  . Urethral disorder URETHRAL SPRAYING    Past Surgical History:  Procedure Laterality Date  . COLPOSCOPY    . CYSTOSCOPY WITH URETHRAL DILATATION N/A 08/02/2012   Procedure: CYSTOSCOPY WITH URETHRAL DILATATION WITH INSTILLATION OF PYRIDIUM AND PELVIC EXAM;  Surgeon: Kathi Ludwig, MD;  Location: [facility];  Service: Urology;  Laterality: N/A;  . INTRAUTERINE DEVICE INSERTION  03/05/2017   MIRENA  . RHINOPLASTY  AGE 71  . TONSILLECTOMY  AGE 46    Family History  Adopted: Yes   Social History:  reports that she quit smoking about 5 years ago. Her smoking use included cigarettes. She has a 3.50 pack-year smoking history. She has never used smokeless tobacco. She reports that she does not drink alcohol and does not use drugs.  Allergies: No Known Allergies  (Not in a hospital admission)   No results found for this or any previous visit (from the past 48 hour(s)). No results found.  Review of Systems  Constitutional: Negative.   HENT: Negative.   Eyes: Negative.   Respiratory: Negative.   Cardiovascular: Negative.   Gastrointestinal: Negative.   Musculoskeletal: Negative.   Skin: Negative.   Neurological: Negative.   Psychiatric/Behavioral: Negative.   All other systems reviewed and are negative.   Blood pressure (!) 155/99, pulse 86, temperature 98.1 F (36.7 C), temperature source Oral, resp. rate 18, height 5\' 5"  (1.651 m), weight 90.7 kg, SpO2 96 %. Physical  Exam Vitals and nursing note reviewed.  Constitutional:      Appearance: She is well-developed.  HENT:     Head: Normocephalic and atraumatic.  Eyes:     Conjunctiva/sclera: Conjunctivae normal.     Pupils: Pupils are equal, round, and reactive to light.  Cardiovascular:     Heart sounds: Normal heart sounds.  Pulmonary:     Effort: Pulmonary effort is normal.  Abdominal:     Palpations: Abdomen is soft.  Musculoskeletal:        General: Normal range of motion.     Cervical back: Normal range of motion.  Skin:    General: Skin is warm and dry.  Neurological:     General: No focal deficit present.     Mental Status: She is alert.  Psychiatric:        Attention and Perception: Attention normal.        Mood and Affect: Mood normal. Affect is blunt.        Speech: Speech is delayed.        Behavior: Behavior is slowed.        Thought Content: Thought content normal. Thought content does not include suicidal ideation.        Cognition and Memory: Cognition normal.        Judgment: Judgment normal.      Assessment/Plan Continue with index course.  Discussed the risks of delirium after awakening.  Adding larger amounts of Versed before leaving the recovery or treatment area this time   Haylyn Halberg, MD 07/18/2020, 12:27 PM

## 2020-07-19 ENCOUNTER — Other Ambulatory Visit: Payer: Self-pay | Admitting: Psychiatry

## 2020-07-20 ENCOUNTER — Other Ambulatory Visit: Payer: Self-pay

## 2020-07-20 ENCOUNTER — Encounter: Payer: Self-pay | Admitting: Certified Registered Nurse Anesthetist

## 2020-07-20 ENCOUNTER — Ambulatory Visit
Admission: RE | Admit: 2020-07-20 | Discharge: 2020-07-20 | Disposition: A | Discharge status: Home / Self Care | Payer: BC Managed Care – PPO | Source: Ambulatory Visit | Attending: Psychiatry | Admitting: Psychiatry

## 2020-07-20 ENCOUNTER — Ambulatory Visit: Payer: Self-pay | Admitting: Certified Registered Nurse Anesthetist

## 2020-07-20 DIAGNOSIS — F333 Major depressive disorder, recurrent, severe with psychotic symptoms: Secondary | ICD-10-CM | POA: Diagnosis not present

## 2020-07-20 DIAGNOSIS — F339 Major depressive disorder, recurrent, unspecified: Secondary | ICD-10-CM | POA: Insufficient documentation

## 2020-07-20 DIAGNOSIS — F319 Bipolar disorder, unspecified: Secondary | ICD-10-CM | POA: Diagnosis not present

## 2020-07-20 DIAGNOSIS — F332 Major depressive disorder, recurrent severe without psychotic features: Secondary | ICD-10-CM

## 2020-07-20 DIAGNOSIS — Z87891 Personal history of nicotine dependence: Secondary | ICD-10-CM | POA: Insufficient documentation

## 2020-07-20 DIAGNOSIS — E785 Hyperlipidemia, unspecified: Secondary | ICD-10-CM | POA: Diagnosis not present

## 2020-07-20 LAB — POCT PREGNANCY, URINE: Preg Test, Ur: NEGATIVE

## 2020-07-20 MED ORDER — GLYCOPYRROLATE 0.2 MG/ML IJ SOLN: 0.1000 mg | Freq: Once | INTRAMUSCULAR | Status: AC

## 2020-07-20 MED ORDER — HALOPERIDOL LACTATE 5 MG/ML IJ SOLN
INTRAMUSCULAR | Status: AC
  Filled 2020-07-20: qty 1

## 2020-07-20 MED ORDER — KETOROLAC TROMETHAMINE 30 MG/ML IJ SOLN
INTRAMUSCULAR | Status: AC
  Administered 2020-07-20: 30 mg via INTRAVENOUS
  Filled 2020-07-20: qty 1

## 2020-07-20 MED ORDER — SUCCINYLCHOLINE CHLORIDE 200 MG/10ML IV SOSY
PREFILLED_SYRINGE | INTRAVENOUS | Status: AC
  Filled 2020-07-20: qty 10

## 2020-07-20 MED ORDER — HALOPERIDOL LACTATE 5 MG/ML IJ SOLN
INTRAMUSCULAR | Status: DC | PRN
  Administered 2020-07-20: 5 mg via INTRAVENOUS

## 2020-07-20 MED ORDER — KETOROLAC TROMETHAMINE 30 MG/ML IJ SOLN: 30.0000 mg | Freq: Once | INTRAMUSCULAR | Status: AC

## 2020-07-20 MED ORDER — SODIUM CHLORIDE 0.9 % IV SOLN: INTRAVENOUS | Status: DC | PRN

## 2020-07-20 MED ORDER — DEXMEDETOMIDINE (PRECEDEX) IN NS 20 MCG/5ML (4 MCG/ML) IV SYRINGE
PREFILLED_SYRINGE | INTRAVENOUS | Status: AC
  Filled 2020-07-20: qty 5

## 2020-07-20 MED ORDER — SUCCINYLCHOLINE CHLORIDE 20 MG/ML IJ SOLN
INTRAMUSCULAR | Status: DC | PRN
  Administered 2020-07-20: 110 mg via INTRAVENOUS

## 2020-07-20 MED ORDER — MIDAZOLAM HCL 2 MG/2ML IJ SOLN
6.0000 mg | Freq: Once | INTRAMUSCULAR | Status: AC
  Administered 2020-07-20: 6 mg via INTRAVENOUS

## 2020-07-20 MED ORDER — MIDAZOLAM HCL 2 MG/2ML IJ SOLN
INTRAMUSCULAR | Status: AC
  Filled 2020-07-20: qty 4

## 2020-07-20 MED ORDER — GLYCOPYRROLATE 0.2 MG/ML IJ SOLN
INTRAMUSCULAR | Status: AC
  Administered 2020-07-20: 0.1 mg via INTRAVENOUS
  Filled 2020-07-20: qty 1

## 2020-07-20 MED ORDER — DEXMEDETOMIDINE (PRECEDEX) IN NS 20 MCG/5ML (4 MCG/ML) IV SYRINGE
PREFILLED_SYRINGE | INTRAVENOUS | Status: DC | PRN
  Administered 2020-07-20: 25 ug via INTRAVENOUS

## 2020-07-20 MED ORDER — METHOHEXITAL SODIUM 100 MG/10ML IV SOSY
PREFILLED_SYRINGE | INTRAVENOUS | Status: DC | PRN
  Administered 2020-07-20: 90 mg via INTRAVENOUS

## 2020-07-20 MED ORDER — MIDAZOLAM HCL 2 MG/2ML IJ SOLN
INTRAMUSCULAR | Status: AC
  Filled 2020-07-20: qty 2

## 2020-07-20 MED ORDER — SODIUM CHLORIDE 0.9 % IV SOLN
500.0000 mL | Freq: Once | INTRAVENOUS | Status: AC
  Administered 2020-07-20: 500 mL via INTRAVENOUS

## 2020-07-20 NOTE — Transfer of Care (Signed)
Immediate Anesthesia Transfer of Care Note  Patient: Abigail Higgins  Procedure(s) Performed: ECT TX  Patient Location: PACU  Anesthesia Type:General  Level of Consciousness: sedated  Airway & Oxygen Therapy: Patient Spontanous Breathing and Patient connected to face mask oxygen  Post-op Assessment: Report given to RN and Post -op Vital signs reviewed and stable  Post vital signs: Reviewed and stable  Last Vitals:  Vitals Value Taken Time  BP 125/90 07/20/20 1351  Temp 36.2 C 07/20/20 1351  Pulse 88 07/20/20 1355  Resp 19 07/20/20 1355  SpO2 100 % 07/20/20 1355  Vitals shown include unvalidated device data.  Last Pain:  Vitals:   07/20/20 1053  TempSrc:   PainSc: 0-No pain         Complications: No complications documented.

## 2020-07-20 NOTE — Anesthesia Preprocedure Evaluation (Signed)
Anesthesia Evaluation  Patient identified by MRN, date of birth, ID band Patient awake    Reviewed: Allergy & Precautions, H&P , NPO status , Patient's Chart, lab work & pertinent test results  History of Anesthesia Complications Negative for: history of anesthetic complications  Airway Mallampati: III  TM Distance: <3 FB Neck ROM: full    Dental  (+) Chipped   Pulmonary neg shortness of breath, former smoker,    Pulmonary exam normal        Cardiovascular Exercise Tolerance: Good (-) angina(-) Past MI and (-) DOE Normal cardiovascular exam     Neuro/Psych  Headaches, PSYCHIATRIC DISORDERS negative psych ROS   GI/Hepatic Neg liver ROS, GERD  Medicated and Controlled,  Endo/Other  negative endocrine ROS  Renal/GU negative Renal ROS  negative genitourinary   Musculoskeletal   Abdominal   Peds  Hematology negative hematology ROS (+)   Anesthesia Other Findings Past Medical History: No date: ADD (attention deficit disorder) No date: Anxiety No date: CIN I (cervical intraepithelial neoplasia I) No date: Depression No date: IC (interstitial cystitis) WATCHES DIET: Mild acid reflux URETHRAL SPRAYING: Urethral disorder  Past Surgical History: No date: COLPOSCOPY 08/02/2012: CYSTOSCOPY WITH URETHRAL DILATATION; N/A     Comment:  Procedure: CYSTOSCOPY WITH URETHRAL DILATATION WITH               INSTILLATION OF PYRIDIUM AND PELVIC EXAM;  Surgeon:               Sigmund I Tannenbaum, MD;  Location: [facility];  Service: Urology;  Laterality: N/A; 03/05/2017: INTRAUTERINE DEVICE INSERTION     Comment:  MIRENA AGE 14: RHINOPLASTY AGE 11: TONSILLECTOMY  BMI    Body Mass Index: 33.28 kg/m      Reproductive/Obstetrics negative OB ROS                             Anesthesia Physical Anesthesia Plan  ASA: III  Anesthesia Plan: General   Post-op Pain  Management:    Induction: Intravenous  PONV Risk Score and Plan: TIVA  Airway Management Planned: Mask  Additional Equipment:   Intra-op Plan:   Post-operative Plan:   Informed Consent: I have reviewed the patients History and Physical, chart, labs and discussed the procedure including the risks, benefits and alternatives for the proposed anesthesia with the patient or authorized representative who has indicated his/her understanding and acceptance.     Dental Advisory Given  Plan Discussed with: Anesthesiologist, CRNA and Surgeon  Anesthesia Plan Comments: (Patient consented for risks of anesthesia including but not limited to:  - adverse reactions to medications - risk of airway placement if required - damage to eyes, teeth, lips or other oral mucosa - nerve damage due to positioning  - sore throat or hoarseness - Damage to heart, brain, nerves, lungs, other parts of body or loss of life  Patient voiced understanding.)        Anesthesia Quick Evaluation  

## 2020-07-20 NOTE — Anesthesia Postprocedure Evaluation (Signed)
Anesthesia Post Note  Patient: Abigail Higgins  Procedure(s) Performed: ECT TX  Patient location during evaluation: PACU Anesthesia Type: General Level of consciousness: awake and alert Pain management: pain level controlled Vital Signs Assessment: post-procedure vital signs reviewed and stable Respiratory status: spontaneous breathing, nonlabored ventilation, respiratory function stable and patient connected to nasal cannula oxygen Cardiovascular status: blood pressure returned to baseline and stable Postop Assessment: no apparent nausea or vomiting Anesthetic complications: no   No complications documented.   Last Vitals:  Vitals:   07/20/20 1440 07/20/20 1450  BP: 126/84 127/89  Pulse: 85 86  Resp: 17 17  Temp:  36.6 C  SpO2: 97% 96%    Last Pain:  Vitals:   07/20/20 1450  TempSrc:   PainSc: 0-No pain                 Lenard Simmer

## 2020-07-23 ENCOUNTER — Other Ambulatory Visit: Payer: Self-pay

## 2020-07-23 ENCOUNTER — Ambulatory Visit: Payer: Self-pay | Admitting: Certified Registered Nurse Anesthetist

## 2020-07-23 ENCOUNTER — Other Ambulatory Visit: Payer: Self-pay | Admitting: Psychiatry

## 2020-07-23 ENCOUNTER — Encounter (HOSPITAL_BASED_OUTPATIENT_CLINIC_OR_DEPARTMENT_OTHER)
Admission: RE | Admit: 2020-07-23 | Discharge: 2020-07-23 | Disposition: A | Discharge status: Home / Self Care | Payer: BC Managed Care – PPO | Source: Ambulatory Visit | Attending: Psychiatry | Admitting: Psychiatry

## 2020-07-23 ENCOUNTER — Encounter: Payer: Self-pay | Admitting: Certified Registered Nurse Anesthetist

## 2020-07-23 DIAGNOSIS — F332 Major depressive disorder, recurrent severe without psychotic features: Secondary | ICD-10-CM | POA: Diagnosis not present

## 2020-07-23 DIAGNOSIS — E785 Hyperlipidemia, unspecified: Secondary | ICD-10-CM | POA: Diagnosis not present

## 2020-07-23 DIAGNOSIS — F333 Major depressive disorder, recurrent, severe with psychotic symptoms: Secondary | ICD-10-CM | POA: Diagnosis not present

## 2020-07-23 DIAGNOSIS — F17211 Nicotine dependence, cigarettes, in remission: Secondary | ICD-10-CM | POA: Diagnosis not present

## 2020-07-23 LAB — POCT PREGNANCY, URINE: Preg Test, Ur: NEGATIVE

## 2020-07-23 MED ORDER — SUCCINYLCHOLINE CHLORIDE 20 MG/ML IJ SOLN
INTRAMUSCULAR | Status: DC | PRN
  Administered 2020-07-23: 110 mg via INTRAVENOUS

## 2020-07-23 MED ORDER — GLYCOPYRROLATE 0.2 MG/ML IJ SOLN
INTRAMUSCULAR | Status: AC
  Administered 2020-07-23: 0.3 mg via INTRAVENOUS
  Filled 2020-07-23: qty 2

## 2020-07-23 MED ORDER — MIDAZOLAM HCL 2 MG/2ML IJ SOLN: 4.0000 mg | Freq: Once | INTRAMUSCULAR | Status: DC

## 2020-07-23 MED ORDER — METHOHEXITAL SODIUM 100 MG/10ML IV SOSY
PREFILLED_SYRINGE | INTRAVENOUS | Status: DC | PRN
  Administered 2020-07-23: 90 mg via INTRAVENOUS

## 2020-07-23 MED ORDER — SODIUM CHLORIDE 0.9 % IV SOLN
500.0000 mL | Freq: Once | INTRAVENOUS | Status: AC
  Administered 2020-07-23: 500 mL via INTRAVENOUS

## 2020-07-23 MED ORDER — HALOPERIDOL LACTATE 5 MG/ML IJ SOLN
INTRAMUSCULAR | Status: DC | PRN
  Administered 2020-07-23: 5 mg via INTRAVENOUS

## 2020-07-23 MED ORDER — HALOPERIDOL LACTATE 5 MG/ML IJ SOLN
INTRAMUSCULAR | Status: AC
  Filled 2020-07-23: qty 1

## 2020-07-23 MED ORDER — GLYCOPYRROLATE 0.2 MG/ML IJ SOLN: 0.3000 mg | Freq: Once | INTRAMUSCULAR | Status: AC

## 2020-07-23 MED ORDER — MIDAZOLAM HCL 2 MG/2ML IJ SOLN
INTRAMUSCULAR | Status: AC
  Filled 2020-07-23: qty 6

## 2020-07-23 MED ORDER — DEXMEDETOMIDINE (PRECEDEX) IN NS 20 MCG/5ML (4 MCG/ML) IV SYRINGE
PREFILLED_SYRINGE | INTRAVENOUS | Status: AC
  Filled 2020-07-23: qty 5

## 2020-07-23 MED ORDER — DEXMEDETOMIDINE HCL 200 MCG/2ML IV SOLN
INTRAVENOUS | Status: DC | PRN
  Administered 2020-07-23: 20 ug via INTRAVENOUS

## 2020-07-23 MED ORDER — KETOROLAC TROMETHAMINE 30 MG/ML IJ SOLN
INTRAMUSCULAR | Status: AC
  Administered 2020-07-23: 30 mg via INTRAVENOUS
  Filled 2020-07-23: qty 1

## 2020-07-23 MED ORDER — MIDAZOLAM HCL 2 MG/2ML IJ SOLN
INTRAMUSCULAR | Status: DC | PRN
  Administered 2020-07-23: 6 mg via INTRAVENOUS

## 2020-07-23 MED ORDER — KETOROLAC TROMETHAMINE 30 MG/ML IJ SOLN: 30.0000 mg | Freq: Once | INTRAMUSCULAR | Status: AC

## 2020-07-23 MED ORDER — SODIUM CHLORIDE 0.9 % IV SOLN: INTRAVENOUS | Status: DC | PRN

## 2020-07-23 NOTE — H&P (Signed)
Abigail Higgins is an 40 y.o. female.   Chief Complaint: Mood is reported as feeling better today.  Does notice memory impairment. HPI: Recurrent severe depression with intermittent irritability and now receiving ECT  Past Medical History:  Diagnosis Date  . ADD (attention deficit disorder)   . Anxiety   . CIN I (cervical intraepithelial neoplasia I)   . Depression   . IC (interstitial cystitis)   . Mild acid reflux WATCHES DIET  . Urethral disorder URETHRAL SPRAYING    Past Surgical History:  Procedure Laterality Date  . COLPOSCOPY    . CYSTOSCOPY WITH URETHRAL DILATATION N/A 08/02/2012   Procedure: CYSTOSCOPY WITH URETHRAL DILATATION WITH INSTILLATION OF PYRIDIUM AND PELVIC EXAM;  Surgeon: Kathi Ludwig, MD;  Location: [facility];  Service: Urology;  Laterality: N/A;  . INTRAUTERINE DEVICE INSERTION  03/05/2017   MIRENA  . RHINOPLASTY  AGE 49  . TONSILLECTOMY  AGE 83    Family History  Adopted: Yes   Social History:  reports that she quit smoking about 5 years ago. Her smoking use included cigarettes. She has a 3.50 pack-year smoking history. She has never used smokeless tobacco. She reports that she does not drink alcohol and does not use drugs.  Allergies: No Known Allergies  (Not in a hospital admission)   Results for orders placed or performed during the hospital encounter of 07/23/20 (from the past 48 hour(s))  Pregnancy, urine POC     Status: None   Collection Time: 07/23/20 10:31 AM  Result Value Ref Range   Preg Test, Ur NEGATIVE NEGATIVE    Comment:        THE SENSITIVITY OF THIS METHODOLOGY IS >24 mIU/mL    No results found.  Review of Systems  Constitutional: Negative.   HENT: Negative.   Eyes: Negative.   Respiratory: Negative.   Cardiovascular: Negative.   Gastrointestinal: Negative.   Musculoskeletal: Negative.   Skin: Negative.   Neurological: Negative.   Psychiatric/Behavioral: Negative.     Blood pressure (!)  145/85, pulse 80, temperature 98.3 F (36.8 C), temperature source Oral, resp. rate 18, height 5\' 5"  (1.651 m), weight 90.7 kg, SpO2 98 %. Physical Exam Vitals and nursing note reviewed.  Constitutional:      Appearance: She is well-developed.  HENT:     Head: Normocephalic and atraumatic.  Eyes:     Conjunctiva/sclera: Conjunctivae normal.     Pupils: Pupils are equal, round, and reactive to light.  Cardiovascular:     Heart sounds: Normal heart sounds.  Pulmonary:     Effort: Pulmonary effort is normal.  Abdominal:     Palpations: Abdomen is soft.  Musculoskeletal:        General: Normal range of motion.     Cervical back: Normal range of motion.  Skin:    General: Skin is warm and dry.  Neurological:     General: No focal deficit present.     Mental Status: She is alert.  Psychiatric:        Mood and Affect: Mood normal.        Thought Content: Thought content normal.        Judgment: Judgment normal.      Assessment/Plan Recommended treatment today and continue with index treatment  , MD 07/23/2020, 10:55 AM

## 2020-07-23 NOTE — Transfer of Care (Signed)
Immediate Anesthesia Transfer of Care Note  Patient: Abigail Higgins  Procedure(s) Performed: ECT TX  Patient Location: PACU  Anesthesia Type:General  Level of Consciousness: sedated  Airway & Oxygen Therapy: Patient Spontanous Breathing and Patient connected to face mask oxygen  Post-op Assessment: Report given to RN and Post -op Vital signs reviewed and stable  Post vital signs: Reviewed and stable  Last Vitals:  Vitals Value Taken Time  BP    Temp    Pulse    Resp    SpO2      Last Pain:  Vitals:   07/23/20 1040  TempSrc:   PainSc: 0-No pain         Complications: No complications documented.

## 2020-07-23 NOTE — Anesthesia Preprocedure Evaluation (Signed)
Anesthesia Evaluation  Patient identified by MRN, date of birth, ID band Patient awake    Reviewed: Allergy & Precautions, H&P , NPO status , Patient's Chart, lab work & pertinent test results  History of Anesthesia Complications Negative for: history of anesthetic complications  Airway Mallampati: III  TM Distance: <3 FB Neck ROM: full    Dental  (+) Chipped   Pulmonary neg shortness of breath, former smoker,    Pulmonary exam normal        Cardiovascular Exercise Tolerance: Good (-) angina(-) Past MI and (-) DOE Normal cardiovascular exam     Neuro/Psych  Headaches, PSYCHIATRIC DISORDERS Anxiety Depression Bipolar Disorder negative psych ROS   GI/Hepatic Neg liver ROS, GERD  Medicated and Controlled,  Endo/Other  negative endocrine ROS  Renal/GU negative Renal ROS  negative genitourinary   Musculoskeletal   Abdominal   Peds  Hematology negative hematology ROS (+)   Anesthesia Other Findings Past Medical History: No date: ADD (attention deficit disorder) No date: Anxiety No date: CIN I (cervical intraepithelial neoplasia I) No date: Depression No date: IC (interstitial cystitis) WATCHES DIET: Mild acid reflux URETHRAL SPRAYING: Urethral disorder  Past Surgical History: No date: COLPOSCOPY 08/02/2012: CYSTOSCOPY WITH URETHRAL DILATATION; N/A     Comment:  Procedure: CYSTOSCOPY WITH URETHRAL DILATATION WITH               INSTILLATION OF PYRIDIUM AND PELVIC EXAM;  Surgeon:               Sigmund I Tannenbaum, MD;  Location: [facility];  Service: Urology;  Laterality: N/A; 03/05/2017: INTRAUTERINE DEVICE INSERTION     Comment:  MIRENA AGE 14: RHINOPLASTY AGE 11: TONSILLECTOMY  BMI    Body Mass Index: 33.28 kg/m      Reproductive/Obstetrics negative OB ROS                             Anesthesia Physical  Anesthesia Plan  ASA: III  Anesthesia  Plan: General   Post-op Pain Management:    Induction: Intravenous  PONV Risk Score and Plan: TIVA  Airway Management Planned: Mask  Additional Equipment:   Intra-op Plan:   Post-operative Plan:   Informed Consent: I have reviewed the patients History and Physical, chart, labs and discussed the procedure including the risks, benefits and alternatives for the proposed anesthesia with the patient or authorized representative who has indicated his/her understanding and acceptance.     Dental Advisory Given  Plan Discussed with: Anesthesiologist, CRNA and Surgeon  Anesthesia Plan Comments: (Patient consented for risks of anesthesia including but not limited to:  - adverse reactions to medications - risk of airway placement if required - damage to eyes, teeth, lips or other oral mucosa - nerve damage due to positioning  - sore throat or hoarseness - Damage to heart, brain, nerves, lungs, other parts of body or loss of life  Patient voiced understanding.)        Anesthesia Quick Evaluation  

## 2020-07-23 NOTE — Anesthesia Postprocedure Evaluation (Signed)
Anesthesia Post Note  Patient: Abigail Higgins  Procedure(s) Performed: ECT TX  Patient location during evaluation: PACU Anesthesia Type: General Level of consciousness: awake and alert, awake and oriented Pain management: pain level controlled Vital Signs Assessment: post-procedure vital signs reviewed and stable Respiratory status: spontaneous breathing, nonlabored ventilation and respiratory function stable Cardiovascular status: blood pressure returned to baseline and stable Postop Assessment: no apparent nausea or vomiting Anesthetic complications: no   No complications documented.   Last Vitals:  Vitals:   07/23/20 1215 07/23/20 1225  BP: (!) 111/91 (!) 116/94  Pulse: 96 91  Resp: (!) 21 19  Temp:    SpO2: 100%     Last Pain:  Vitals:   07/23/20 1204  TempSrc:   PainSc: Asleep                 Manfred Arch

## 2020-07-24 ENCOUNTER — Other Ambulatory Visit: Payer: Self-pay | Admitting: Psychiatry

## 2020-07-25 ENCOUNTER — Other Ambulatory Visit: Payer: Self-pay

## 2020-07-25 ENCOUNTER — Encounter (HOSPITAL_BASED_OUTPATIENT_CLINIC_OR_DEPARTMENT_OTHER)
Admission: RE | Admit: 2020-07-25 | Discharge: 2020-07-25 | Disposition: A | Discharge status: Home / Self Care | Payer: BC Managed Care – PPO | Source: Ambulatory Visit | Attending: Psychiatry | Admitting: Psychiatry

## 2020-07-25 ENCOUNTER — Encounter: Payer: Self-pay | Admitting: Certified Registered"

## 2020-07-25 ENCOUNTER — Ambulatory Visit: Payer: Self-pay | Admitting: Certified Registered"

## 2020-07-25 DIAGNOSIS — F332 Major depressive disorder, recurrent severe without psychotic features: Secondary | ICD-10-CM

## 2020-07-25 MED ORDER — MIDAZOLAM HCL 2 MG/2ML IJ SOLN
INTRAMUSCULAR | Status: AC
  Filled 2020-07-25: qty 6

## 2020-07-25 MED ORDER — HALOPERIDOL LACTATE 5 MG/ML IJ SOLN
INTRAMUSCULAR | Status: DC | PRN
  Administered 2020-07-25: 5 mg via INTRAVENOUS

## 2020-07-25 MED ORDER — ONDANSETRON HCL 4 MG/2ML IJ SOLN: 4.0000 mg | Freq: Once | INTRAMUSCULAR | Status: DC | PRN

## 2020-07-25 MED ORDER — FENTANYL CITRATE (PF) 100 MCG/2ML IJ SOLN: 25.0000 ug | INTRAMUSCULAR | Status: DC | PRN

## 2020-07-25 MED ORDER — GLYCOPYRROLATE 0.2 MG/ML IJ SOLN
INTRAMUSCULAR | Status: AC
  Administered 2020-07-25: 0.3 mg via INTRAVENOUS
  Filled 2020-07-25: qty 2

## 2020-07-25 MED ORDER — HALOPERIDOL LACTATE 5 MG/ML IJ SOLN
INTRAMUSCULAR | Status: AC
  Filled 2020-07-25: qty 1

## 2020-07-25 MED ORDER — MEPERIDINE HCL 50 MG/ML IJ SOLN: 6.2500 mg | INTRAMUSCULAR | Status: DC | PRN

## 2020-07-25 MED ORDER — SODIUM CHLORIDE 0.9 % IV SOLN
500.0000 mL | Freq: Once | INTRAVENOUS | Status: AC
  Administered 2020-07-25: 500 mL via INTRAVENOUS

## 2020-07-25 MED ORDER — DEXMEDETOMIDINE (PRECEDEX) IN NS 20 MCG/5ML (4 MCG/ML) IV SYRINGE
PREFILLED_SYRINGE | INTRAVENOUS | Status: DC | PRN
  Administered 2020-07-25: 8 ug via INTRAVENOUS
  Administered 2020-07-25: 12 ug via INTRAVENOUS

## 2020-07-25 MED ORDER — MIDAZOLAM HCL 2 MG/2ML IJ SOLN
6.0000 mg | Freq: Once | INTRAMUSCULAR | Status: AC
  Administered 2020-07-25 (×2): 3 mg via INTRAVENOUS

## 2020-07-25 MED ORDER — GLYCOPYRROLATE 0.2 MG/ML IJ SOLN: 0.3000 mg | Freq: Once | INTRAMUSCULAR | Status: AC

## 2020-07-25 MED ORDER — METHOHEXITAL SODIUM 100 MG/10ML IV SOSY
PREFILLED_SYRINGE | INTRAVENOUS | Status: DC | PRN
  Administered 2020-07-25: 90 mg via INTRAVENOUS

## 2020-07-25 MED ORDER — KETOROLAC TROMETHAMINE 30 MG/ML IJ SOLN: 30.0000 mg | Freq: Once | INTRAMUSCULAR | Status: AC

## 2020-07-25 MED ORDER — SUCCINYLCHOLINE CHLORIDE 200 MG/10ML IV SOSY
PREFILLED_SYRINGE | INTRAVENOUS | Status: AC
  Filled 2020-07-25: qty 10

## 2020-07-25 MED ORDER — KETOROLAC TROMETHAMINE 30 MG/ML IJ SOLN
INTRAMUSCULAR | Status: AC
  Administered 2020-07-25: 30 mg via INTRAVENOUS
  Filled 2020-07-25: qty 1

## 2020-07-25 MED ORDER — SUCCINYLCHOLINE CHLORIDE 200 MG/10ML IV SOSY
PREFILLED_SYRINGE | INTRAVENOUS | Status: DC | PRN
  Administered 2020-07-25: 110 mg via INTRAVENOUS

## 2020-07-25 NOTE — H&P (Signed)
Abigail Higgins is an 40 y.o. female.   Chief Complaint: "I guess I am okay" HPI: Follow-up in this index course for ECT.  Being treated for severe recurrent depression.  Mood today slightly less enthusiastic than on Monday  Past Medical History:  Diagnosis Date  . ADD (attention deficit disorder)   . Anxiety   . CIN I (cervical intraepithelial neoplasia I)   . Depression   . IC (interstitial cystitis)   . Mild acid reflux WATCHES DIET  . Urethral disorder URETHRAL SPRAYING    Past Surgical History:  Procedure Laterality Date  . COLPOSCOPY    . CYSTOSCOPY WITH URETHRAL DILATATION N/A 08/02/2012   Procedure: CYSTOSCOPY WITH URETHRAL DILATATION WITH INSTILLATION OF PYRIDIUM AND PELVIC EXAM;  Surgeon: Kathi Ludwig, MD;  Location: [facility];  Service: Urology;  Laterality: N/A;  . INTRAUTERINE DEVICE INSERTION  03/05/2017   MIRENA  . RHINOPLASTY  AGE 41  . TONSILLECTOMY  AGE 41    Family History  Adopted: Yes   Social History:  reports that she quit smoking about 5 years ago. Her smoking use included cigarettes. She has a 3.50 pack-year smoking history. She has never used smokeless tobacco. She reports that she does not drink alcohol and does not use drugs.  Allergies: No Known Allergies  (Not in a hospital admission)   No results found for this or any previous visit (from the past 48 hour(s)). No results found.  Review of Systems  Constitutional: Negative.   HENT: Negative.   Eyes: Negative.   Respiratory: Negative.   Cardiovascular: Negative.   Gastrointestinal: Negative.   Musculoskeletal: Negative.   Skin: Negative.   Neurological: Negative.   Psychiatric/Behavioral: Negative.     Blood pressure (!) 136/97, pulse 83, temperature (!) 97.3 F (36.3 C), resp. rate 18, height 5\' 5"  (1.651 m), weight 90.7 kg, SpO2 98 %. Physical Exam Vitals and nursing note reviewed.  Constitutional:      Appearance: She is well-developed.  HENT:      Head: Normocephalic and atraumatic.  Eyes:     Conjunctiva/sclera: Conjunctivae normal.     Pupils: Pupils are equal, round, and reactive to light.  Cardiovascular:     Heart sounds: Normal heart sounds.  Pulmonary:     Effort: Pulmonary effort is normal.  Abdominal:     Palpations: Abdomen is soft.  Musculoskeletal:        General: Normal range of motion.     Cervical back: Normal range of motion.  Skin:    General: Skin is warm and dry.  Neurological:     Mental Status: She is alert.  Psychiatric:        Attention and Perception: Attention normal.        Mood and Affect: Affect is blunt.        Speech: Speech normal.        Behavior: Behavior normal.        Thought Content: Thought content normal. Thought content does not include suicidal ideation.        Cognition and Memory: Cognition normal.        Judgment: Judgment normal.      Assessment/Plan Treatment today and continue with index course.  Patient does seem to be showing some signs of improvement.  , MD 07/25/2020, 5:46 PM

## 2020-07-25 NOTE — Anesthesia Preprocedure Evaluation (Signed)
Anesthesia Evaluation  Patient identified by MRN, date of birth, ID band Patient awake    Reviewed: Allergy & Precautions, H&P , NPO status , Patient's Chart, lab work & pertinent test results  History of Anesthesia Complications Negative for: history of anesthetic complications  Airway Mallampati: III  TM Distance: <3 FB Neck ROM: full    Dental  (+) Chipped   Pulmonary neg shortness of breath, former smoker,    Pulmonary exam normal        Cardiovascular Exercise Tolerance: Good (-) angina(-) Past MI and (-) DOE Normal cardiovascular exam     Neuro/Psych  Headaches, PSYCHIATRIC DISORDERS Anxiety Depression Bipolar Disorder negative psych ROS   GI/Hepatic Neg liver ROS, GERD  Medicated and Controlled,  Endo/Other  negative endocrine ROS  Renal/GU negative Renal ROS  negative genitourinary   Musculoskeletal   Abdominal   Peds  Hematology negative hematology ROS (+)   Anesthesia Other Findings Past Medical History: No date: ADD (attention deficit disorder) No date: Anxiety No date: CIN I (cervical intraepithelial neoplasia I) No date: Depression No date: IC (interstitial cystitis) WATCHES DIET: Mild acid reflux URETHRAL SPRAYING: Urethral disorder  Past Surgical History: No date: COLPOSCOPY 08/02/2012: CYSTOSCOPY WITH URETHRAL DILATATION; N/A     Comment:  Procedure: CYSTOSCOPY WITH URETHRAL DILATATION WITH               INSTILLATION OF PYRIDIUM AND PELVIC EXAM;  Surgeon:               Kathi Ludwig, MD;  Location: [facility];  Service: Urology;  Laterality: N/A; 03/05/2017: INTRAUTERINE DEVICE INSERTION     Comment:  MIRENA AGE 40: RHINOPLASTY AGE 51: TONSILLECTOMY  BMI    Body Mass Index: 33.28 kg/m      Reproductive/Obstetrics negative OB ROS                             Anesthesia Physical  Anesthesia Plan  ASA: III  Anesthesia  Plan: General   Post-op Pain Management:    Induction: Intravenous  PONV Risk Score and Plan: TIVA  Airway Management Planned: Mask  Additional Equipment:   Intra-op Plan:   Post-operative Plan:   Informed Consent: I have reviewed the patients History and Physical, chart, labs and discussed the procedure including the risks, benefits and alternatives for the proposed anesthesia with the patient or authorized representative who has indicated his/her understanding and acceptance.     Dental Advisory Given  Plan Discussed with: Anesthesiologist, CRNA and Surgeon  Anesthesia Plan Comments: (Patient consented for risks of anesthesia including but not limited to:  - adverse reactions to medications - risk of airway placement if required - damage to eyes, teeth, lips or other oral mucosa - nerve damage due to positioning  - sore throat or hoarseness - Damage to heart, brain, nerves, lungs, other parts of body or loss of life  Patient voiced understanding.)        Anesthesia Quick Evaluation

## 2020-07-25 NOTE — Anesthesia Postprocedure Evaluation (Signed)
Anesthesia Post Note  Patient: Abigail Higgins  Procedure(s) Performed: ECT TX  Patient location during evaluation: PACU Anesthesia Type: General Level of consciousness: awake and alert, awake and oriented Pain management: pain level controlled Vital Signs Assessment: post-procedure vital signs reviewed and stable Respiratory status: spontaneous breathing, nonlabored ventilation and respiratory function stable Cardiovascular status: blood pressure returned to baseline and stable Postop Assessment: no apparent nausea or vomiting Anesthetic complications: no   No complications documented.   Last Vitals:  Vitals:   07/25/20 1350 07/25/20 1400  BP: (!) 137/97 (!) 130/96  Pulse: 86 84  Resp: 19 17  Temp:    SpO2: 100% 100%    Last Pain:  Vitals:   07/25/20 1400  TempSrc:   PainSc: Asleep                 Manfred Arch

## 2020-07-25 NOTE — Transfer of Care (Signed)
Immediate Anesthesia Transfer of Care Note  Patient: Abigail Higgins  Procedure(s) Performed: ECT TX  Patient Location: PACU  Anesthesia Type:General  Level of Consciousness: drowsy  Airway & Oxygen Therapy: Patient Spontanous Breathing and Patient connected to nasal cannula oxygen  Post-op Assessment: Report given to RN  Post vital signs: stable  Last Vitals:  Vitals Value Taken Time  BP    Temp    Pulse 94 07/25/20 1330  Resp 26 07/25/20 1330  SpO2 96 % 07/25/20 1330  Vitals shown include unvalidated device data.  Last Pain:  Vitals:   07/25/20 1155  TempSrc:   PainSc: 0-No pain         Complications: No complications documented.

## 2020-07-25 NOTE — Procedures (Signed)
ECT SERVICES Physician's Interval Evaluation & Treatment Note  Patient Identification: Abigail Higgins MRN:  [id number] Date of Evaluation:  07/25/2020 TX #: 5  MADRS:   MMSE:   P.E. Findings:  No change  Psychiatric Interval Note:  Mild depression  Subjective:  Patient is a 40 y.o. female seen for evaluation for Electroconvulsive Therapy. Not quite as good  Treatment Summary:   [x]   Right Unilateral             []  Bilateral   % Energy : 0.58ms 65%   Impedance: 2320 ohms  Seizure Energy Index: 5704 mv2  Postictal Suppression Index: 75%  Seizure Concordance Index: 53%  Medications  Pre Shock: r 0.3mg , brec 90mg , suc 110mg   Post Shock: ver 6mg , hal 5mg , pdx 25 mcg  Seizure Duration: 18s emg, 31s eeg   Comments: Follow up friday   Lungs:  [x]   Clear to auscultation               []  Other:   Heart:    [x]   Regular rhythm             []  irregular rhythm    [x]   Previous H&P reviewed, patient examined and there are NO CHANGES                 []   Previous H&P reviewed, patient examined and there are changes noted.   , MD 5/11/20225:47 PM

## 2020-07-26 ENCOUNTER — Other Ambulatory Visit: Payer: Self-pay | Admitting: Psychiatry

## 2020-07-27 ENCOUNTER — Encounter: Payer: Self-pay | Admitting: Registered Nurse

## 2020-07-27 ENCOUNTER — Encounter (HOSPITAL_BASED_OUTPATIENT_CLINIC_OR_DEPARTMENT_OTHER)
Admission: RE | Admit: 2020-07-27 | Discharge: 2020-07-27 | Disposition: A | Discharge status: Home / Self Care | Payer: BC Managed Care – PPO | Source: Ambulatory Visit | Attending: Psychiatry | Admitting: Psychiatry

## 2020-07-27 ENCOUNTER — Ambulatory Visit: Payer: Self-pay | Admitting: Registered Nurse

## 2020-07-27 ENCOUNTER — Other Ambulatory Visit: Payer: Self-pay

## 2020-07-27 DIAGNOSIS — F332 Major depressive disorder, recurrent severe without psychotic features: Secondary | ICD-10-CM | POA: Diagnosis not present

## 2020-07-27 DIAGNOSIS — F319 Bipolar disorder, unspecified: Secondary | ICD-10-CM | POA: Diagnosis not present

## 2020-07-27 DIAGNOSIS — F333 Major depressive disorder, recurrent, severe with psychotic symptoms: Secondary | ICD-10-CM | POA: Diagnosis not present

## 2020-07-27 LAB — POCT PREGNANCY, URINE: Preg Test, Ur: NEGATIVE

## 2020-07-27 MED ORDER — GLYCOPYRROLATE 0.2 MG/ML IJ SOLN
INTRAMUSCULAR | Status: AC
  Administered 2020-07-27: 0.3 mg via INTRAVENOUS
  Filled 2020-07-27: qty 2

## 2020-07-27 MED ORDER — MIDAZOLAM HCL 2 MG/2ML IJ SOLN
INTRAMUSCULAR | Status: AC
  Filled 2020-07-27: qty 6

## 2020-07-27 MED ORDER — KETOROLAC TROMETHAMINE 30 MG/ML IJ SOLN: 30.0000 mg | Freq: Once | INTRAMUSCULAR | Status: AC

## 2020-07-27 MED ORDER — DEXMEDETOMIDINE BOLUS VIA INFUSION
0.2500 ug/kg | Freq: Once | INTRAVENOUS | Status: DC
  Filled 2020-07-27: qty 23

## 2020-07-27 MED ORDER — SODIUM CHLORIDE 0.9 % IV SOLN: INTRAVENOUS | Status: DC | PRN

## 2020-07-27 MED ORDER — METHOHEXITAL SODIUM 100 MG/10ML IV SOSY
PREFILLED_SYRINGE | INTRAVENOUS | Status: DC | PRN
  Administered 2020-07-27: 90 mg via INTRAVENOUS

## 2020-07-27 MED ORDER — MIDAZOLAM HCL 2 MG/2ML IJ SOLN: 6.0000 mg | Freq: Once | INTRAMUSCULAR | Status: DC

## 2020-07-27 MED ORDER — GLYCOPYRROLATE 0.2 MG/ML IJ SOLN: 0.3000 mg | Freq: Once | INTRAMUSCULAR | Status: AC

## 2020-07-27 MED ORDER — SODIUM CHLORIDE 0.9 % IV SOLN
500.0000 mL | Freq: Once | INTRAVENOUS | Status: AC
  Administered 2020-07-27: 500 mL via INTRAVENOUS

## 2020-07-27 MED ORDER — HALOPERIDOL LACTATE 5 MG/ML IJ SOLN
5.0000 mg | Freq: Once | INTRAMUSCULAR | Status: DC
  Filled 2020-07-27: qty 1

## 2020-07-27 MED ORDER — MIDAZOLAM HCL 2 MG/2ML IJ SOLN
INTRAMUSCULAR | Status: DC | PRN
  Administered 2020-07-27: 6 mg via INTRAVENOUS

## 2020-07-27 MED ORDER — FENTANYL CITRATE (PF) 100 MCG/2ML IJ SOLN: 25.0000 ug | INTRAMUSCULAR | Status: DC | PRN

## 2020-07-27 MED ORDER — ONDANSETRON HCL 4 MG/2ML IJ SOLN: 4.0000 mg | Freq: Once | INTRAMUSCULAR | Status: DC | PRN

## 2020-07-27 MED ORDER — SUCCINYLCHOLINE CHLORIDE 20 MG/ML IJ SOLN
INTRAMUSCULAR | Status: DC | PRN
  Administered 2020-07-27: 110 mg via INTRAVENOUS

## 2020-07-27 MED ORDER — HALOPERIDOL LACTATE 5 MG/ML IJ SOLN
INTRAMUSCULAR | Status: DC | PRN
  Administered 2020-07-27: 5 mg via INTRAVENOUS

## 2020-07-27 MED ORDER — DEXMEDETOMIDINE (PRECEDEX) IN NS 20 MCG/5ML (4 MCG/ML) IV SYRINGE
PREFILLED_SYRINGE | INTRAVENOUS | Status: AC
  Filled 2020-07-27: qty 5

## 2020-07-27 MED ORDER — DEXMEDETOMIDINE HCL 200 MCG/2ML IV SOLN
INTRAVENOUS | Status: DC | PRN
  Administered 2020-07-27: 20 ug via INTRAVENOUS

## 2020-07-27 MED ORDER — KETOROLAC TROMETHAMINE 30 MG/ML IJ SOLN
INTRAMUSCULAR | Status: AC
  Administered 2020-07-27: 30 mg via INTRAVENOUS
  Filled 2020-07-27: qty 1

## 2020-07-27 MED ORDER — LABETALOL HCL 5 MG/ML IV SOLN
INTRAVENOUS | Status: DC | PRN
  Administered 2020-07-27: 10 mg via INTRAVENOUS

## 2020-07-27 NOTE — Procedures (Signed)
ECT SERVICES Physician's Interval Evaluation & Treatment Note  Patient Identification: Abigail Higgins MRN:  [id number] Date of Evaluation:  07/27/2020 TX #: 6  MADRS:   MMSE:   P.E. Findings:  No change to physical exam  Psychiatric Interval Note:  Affect and mood brighter and more upbeat.  Does complain of memory issues  Subjective:  Patient is a 40 y.o. female seen for evaluation for Electroconvulsive Therapy. Short-term memory concern  Treatment Summary:   [x]   Right Unilateral             []  Bilateral   % Energy : 0.3 ms 65%   Impedance: 1950 ohms  Seizure Energy Index: 8531 V squared  Postictal Suppression Index: 77%  Seizure Concordance Index: 84%  Medications  Pre Shock: Robinul 0.3 mg Brevital 90 mg succinylcholine 110 mg  Post Shock: Versed 6 mg Haldol 5 mg Precedex 25 mcg  Seizure Duration: 18 seconds EMG 32 seconds EEG   Comments: Well-tolerated with good recovery.  Reassess Monday for possible end of index course  Lungs:  [x]   Clear to auscultation               []  Other:   Heart:    [x]   Regular rhythm             []  irregular rhythm    [x]   Previous H&P reviewed, patient examined and there are NO CHANGES                 []   Previous H&P reviewed, patient examined and there are changes noted.   , MD 5/13/20224:33 PM

## 2020-07-27 NOTE — H&P (Signed)
Abigail Higgins is an 40 y.o. female.   Chief Complaint: Mood is significantly better.  Does notice short-term memory problems HPI: History of recurrent depression  Past Medical History:  Diagnosis Date  . ADD (attention deficit disorder)   . Anxiety   . CIN I (cervical intraepithelial neoplasia I)   . Depression   . IC (interstitial cystitis)   . Mild acid reflux WATCHES DIET  . Urethral disorder URETHRAL SPRAYING    Past Surgical History:  Procedure Laterality Date  . COLPOSCOPY    . CYSTOSCOPY WITH URETHRAL DILATATION N/A 08/02/2012   Procedure: CYSTOSCOPY WITH URETHRAL DILATATION WITH INSTILLATION OF PYRIDIUM AND PELVIC EXAM;  Surgeon: Kathi Ludwig, MD;  Location: [facility];  Service: Urology;  Laterality: N/A;  . INTRAUTERINE DEVICE INSERTION  03/05/2017   MIRENA  . RHINOPLASTY  AGE 18  . TONSILLECTOMY  AGE 29    Family History  Adopted: Yes   Social History:  reports that she quit smoking about 5 years ago. Her smoking use included cigarettes. She has a 3.50 pack-year smoking history. She has never used smokeless tobacco. She reports that she does not drink alcohol and does not use drugs.  Allergies: No Known Allergies  (Not in a hospital admission)   Results for orders placed or performed during the hospital encounter of 07/27/20 (from the past 48 hour(s))  Pregnancy, urine POC     Status: None   Collection Time: 07/27/20 10:14 AM  Result Value Ref Range   Preg Test, Ur NEGATIVE NEGATIVE    Comment:        THE SENSITIVITY OF THIS METHODOLOGY IS >24 mIU/mL    No results found.  Review of Systems  Constitutional: Negative.   HENT: Negative.   Eyes: Negative.   Respiratory: Negative.   Cardiovascular: Negative.   Gastrointestinal: Negative.   Musculoskeletal: Negative.   Skin: Negative.   Neurological: Negative.   Psychiatric/Behavioral: Negative.   All other systems reviewed and are negative.   Blood pressure 112/85, pulse  82, temperature 98.2 F (36.8 C), temperature source Oral, resp. rate 18, height 5\' 5"  (1.651 m), weight 90.7 kg, SpO2 96 %. Physical Exam Vitals and nursing note reviewed.  Constitutional:      Appearance: She is well-developed.  HENT:     Head: Normocephalic and atraumatic.  Eyes:     Conjunctiva/sclera: Conjunctivae normal.     Pupils: Pupils are equal, round, and reactive to light.  Cardiovascular:     Heart sounds: Normal heart sounds.  Pulmonary:     Effort: Pulmonary effort is normal.  Abdominal:     Palpations: Abdomen is soft.  Musculoskeletal:        General: Normal range of motion.     Cervical back: Normal range of motion.  Skin:    General: Skin is warm and dry.  Neurological:     General: No focal deficit present.     Mental Status: She is alert.  Psychiatric:        Mood and Affect: Mood normal.        Thought Content: Thought content normal.        Cognition and Memory: She exhibits impaired recent memory.      Assessment/Plan Treatment today and I recommend coming back Monday to assess if she has plateaued.  Monday, MD 07/27/2020, 4:32 PM

## 2020-07-27 NOTE — H&P (Signed)
Abigail Higgins is an 40 y.o. female.   Chief Complaint: Mood is slightly improved HPI: History of recurrent depression  Past Medical History:  Diagnosis Date  . ADD (attention deficit disorder)   . Anxiety   . CIN I (cervical intraepithelial neoplasia I)   . Depression   . IC (interstitial cystitis)   . Mild acid reflux WATCHES DIET  . Urethral disorder URETHRAL SPRAYING    Past Surgical History:  Procedure Laterality Date  . COLPOSCOPY    . CYSTOSCOPY WITH URETHRAL DILATATION N/A 08/02/2012   Procedure: CYSTOSCOPY WITH URETHRAL DILATATION WITH INSTILLATION OF PYRIDIUM AND PELVIC EXAM;  Surgeon: Kathi Ludwig, MD;  Location: [facility];  Service: Urology;  Laterality: N/A;  . INTRAUTERINE DEVICE INSERTION  03/05/2017   MIRENA  . RHINOPLASTY  AGE 35  . TONSILLECTOMY  AGE 53    Family History  Adopted: Yes   Social History:  reports that she quit smoking about 5 years ago. Her smoking use included cigarettes. She has a 3.50 pack-year smoking history. She has never used smokeless tobacco. She reports that she does not drink alcohol and does not use drugs.  Allergies: No Known Allergies  (Not in a hospital admission)   No results found for this or any previous visit (from the past 48 hour(s)). No results found.  Review of Systems  Constitutional: Negative.   HENT: Negative.   Eyes: Negative.   Respiratory: Negative.   Cardiovascular: Negative.   Gastrointestinal: Negative.   Musculoskeletal: Negative.   Skin: Negative.   Neurological: Negative.   Psychiatric/Behavioral: Positive for confusion.    Blood pressure 125/90, pulse 85, temperature 97.9 F (36.6 C), temperature source Oral, resp. rate 18, height 5\' 5"  (1.651 m), weight 90.7 kg, SpO2 96 %. Physical Exam Vitals and nursing note reviewed.  Constitutional:      Appearance: She is well-developed.  HENT:     Head: Normocephalic and atraumatic.  Eyes:     Conjunctiva/sclera:  Conjunctivae normal.     Pupils: Pupils are equal, round, and reactive to light.  Cardiovascular:     Heart sounds: Normal heart sounds.  Pulmonary:     Effort: Pulmonary effort is normal.  Abdominal:     Palpations: Abdomen is soft.  Musculoskeletal:        General: Normal range of motion.     Cervical back: Normal range of motion.  Skin:    General: Skin is warm and dry.  Neurological:     Mental Status: She is alert.  Psychiatric:        Mood and Affect: Mood normal.        Thought Content: Thought content normal.        Judgment: Judgment normal.      Assessment/Plan Continuing index course  , MD 07/20/2020, 4:58 PM

## 2020-07-27 NOTE — Anesthesia Preprocedure Evaluation (Signed)
Anesthesia Evaluation  Patient identified by MRN, date of birth, ID band Patient awake    Reviewed: Allergy & Precautions, H&P , NPO status , Patient's Chart, lab work & pertinent test results, reviewed documented beta blocker date and time   Airway Mallampati: II   Neck ROM: full    Dental  (+) Poor Dentition   Pulmonary neg pulmonary ROS, former smoker,    Pulmonary exam normal        Cardiovascular Exercise Tolerance: Good negative cardio ROS Normal cardiovascular exam Rhythm:regular Rate:Normal     Neuro/Psych  Headaches, Anxiety Depression Bipolar Disorder negative psych ROS   GI/Hepatic Neg liver ROS, GERD  Medicated,  Endo/Other  negative endocrine ROS  Renal/GU negative Renal ROS  negative genitourinary   Musculoskeletal   Abdominal   Peds  Hematology negative hematology ROS (+)   Anesthesia Other Findings Past Medical History: No date: ADD (attention deficit disorder) No date: Anxiety No date: CIN I (cervical intraepithelial neoplasia I) No date: Depression No date: IC (interstitial cystitis) WATCHES DIET: Mild acid reflux URETHRAL SPRAYING: Urethral disorder Past Surgical History: No date: COLPOSCOPY 08/02/2012: CYSTOSCOPY WITH URETHRAL DILATATION; N/A     Comment:  Procedure: CYSTOSCOPY WITH URETHRAL DILATATION WITH               INSTILLATION OF PYRIDIUM AND PELVIC EXAM;  Surgeon:               Sigmund I Tannenbaum, MD;  Location: [facility];  Service: Urology;  Laterality: N/A; 03/05/2017: INTRAUTERINE DEVICE INSERTION     Comment:  MIRENA AGE 40: RHINOPLASTY AGE 40: TONSILLECTOMY BMI    Body Mass Index: 33.28 kg/m     Reproductive/Obstetrics negative OB ROS                             Anesthesia Physical  Anesthesia Plan  ASA: II  Anesthesia Plan: General   Post-op Pain Management:    Induction:   PONV Risk Score and Plan:    Airway Management Planned:   Additional Equipment:   Intra-op Plan:   Post-operative Plan:   Informed Consent: I have reviewed the patients History and Physical, chart, labs and discussed the procedure including the risks, benefits and alternatives for the proposed anesthesia with the patient or authorized representative who has indicated his/her understanding and acceptance.     Dental Advisory Given  Plan Discussed with: CRNA  Anesthesia Plan Comments:         Anesthesia Quick Evaluation  

## 2020-07-27 NOTE — Procedures (Signed)
ECT SERVICES Physician's Interval Evaluation & Treatment Note  Patient Identification: SAMADHI MAHURIN MRN:  [id number] Date of Evaluation:  07/23/2020 TX #: 4  MADRS:   MMSE:   P.E. Findings:  Physical exam unremarkable  Psychiatric Interval Note:  Mood is reported as being significantly better  Subjective:  Patient is a 40 y.o. female seen for evaluation for Electroconvulsive Therapy. Feeling no longer depressed no suicidal thoughts  Treatment Summary:   [x]   Right Unilateral             []  Bilateral   % Energy : 0.3 ms 45%   Impedance: 2180 ohms  Seizure Energy Index: 2414 V squared  Postictal Suppression Index: 79%  Seizure Concordance Index: 49%  Medications  Pre Shock: Robinul 0.3 mg Brevital 90 mg succinylcholine 110 mg  Post Shock: Versed 6 mg Haldol 5 mg Precedex 25 mcg  Seizure Duration: 22 seconds EMG 28 seconds EEG   Comments: Continue index course follow-up Wednesday  Lungs:  [x]   Clear to auscultation               []  Other:   Heart:    [x]   Regular rhythm             []  irregular rhythm    [x]   Previous H&P reviewed, patient examined and there are NO CHANGES                 []   Previous H&P reviewed, patient examined and there are changes noted.   2181, MD 5/9/20224:44 PM

## 2020-07-27 NOTE — Procedures (Signed)
ECT SERVICES Physician's Interval Evaluation & Treatment Note  Patient Identification: Abigail Higgins MRN:  [id number] Date of Evaluation:  07/20/2020 TX #: 3  MADRS:   MMSE:   P.E. Findings:  No change to physical exam  Psychiatric Interval Note:  Slight improvement to subjective mood  Subjective:  Patient is a 40 y.o. female seen for evaluation for Electroconvulsive Therapy. Slightly less angry  Treatment Summary:   [x]   Right Unilateral             []  Bilateral   % Energy : 0.3 ms 45%   Impedance: 2290 ohms  Seizure Energy Index: 7419 V squared  Postictal Suppression Index: 82%  Seizure Concordance Index: 94%  Medications  Pre Shock: Robinul 0.1 mg Brevital 90 mg succinylcholine 110 mg  Post Shock: Versed 6 mg Haldol 5 mg Precedex 25 mcg  Seizure Duration: 26 seconds EMG 36 seconds EEG   Comments: Continue index treatment  Lungs:  [x]   Clear to auscultation               []  Other:   Heart:    [x]   Regular rhythm             []  irregular rhythm    [x]   Previous H&P reviewed, patient examined and there are NO CHANGES                 []   Previous H&P reviewed, patient examined and there are changes noted.   , MD 5/6/20224:59 PM

## 2020-07-27 NOTE — Transfer of Care (Signed)
Immediate Anesthesia Transfer of Care Note  Patient: Abigail Higgins  Procedure(s) Performed: ECT TX  Patient Location: PACU  Anesthesia Type:General  Level of Consciousness: drowsy  Airway & Oxygen Therapy: Patient Spontanous Breathing and Patient connected to face mask oxygen  Post-op Assessment: Report given to RN and Post -op Vital signs reviewed and stable  Post vital signs: Reviewed and stable  Last Vitals:  Vitals Value Taken Time  BP 126/82 07/27/20 1322  Temp    Pulse 92 07/27/20 1324  Resp 24 07/27/20 1324  SpO2 96 % 07/27/20 1324  Vitals shown include unvalidated device data.  Last Pain:  Vitals:   07/27/20 1024  TempSrc: Oral  PainSc: 0-No pain         Complications: No complications documented.

## 2020-07-28 NOTE — Anesthesia Postprocedure Evaluation (Signed)
Anesthesia Post Note  Patient: NETTY SULLIVANT  Procedure(s) Performed: ECT TX  Patient location during evaluation: PACU Anesthesia Type: General Level of consciousness: awake and alert Pain management: pain level controlled Vital Signs Assessment: post-procedure vital signs reviewed and stable Respiratory status: spontaneous breathing, nonlabored ventilation, respiratory function stable and patient connected to nasal cannula oxygen Cardiovascular status: blood pressure returned to baseline and stable Postop Assessment: no apparent nausea or vomiting Anesthetic complications: no   No complications documented.   Last Vitals:  Vitals:   07/27/20 1440 07/27/20 1502  BP: 111/87 112/85  Pulse: 84 82  Resp: 18 18  Temp: 36.7 C 36.8 C  SpO2: 96%     Last Pain:  Vitals:   07/27/20 1502  TempSrc: Oral  PainSc: 0-No pain                 Yevette Edwards

## 2020-07-30 ENCOUNTER — Encounter: Payer: Self-pay | Admitting: Anesthesiology

## 2020-07-30 ENCOUNTER — Other Ambulatory Visit: Payer: Self-pay

## 2020-07-30 ENCOUNTER — Ambulatory Visit: Payer: Self-pay | Admitting: Anesthesiology

## 2020-07-30 ENCOUNTER — Other Ambulatory Visit: Payer: Self-pay | Admitting: Psychiatry

## 2020-07-30 ENCOUNTER — Encounter (HOSPITAL_BASED_OUTPATIENT_CLINIC_OR_DEPARTMENT_OTHER)
Admission: RE | Admit: 2020-07-30 | Discharge: 2020-07-30 | Disposition: A | Discharge status: Home / Self Care | Payer: BC Managed Care – PPO | Source: Ambulatory Visit | Attending: Psychiatry | Admitting: Psychiatry

## 2020-07-30 DIAGNOSIS — F332 Major depressive disorder, recurrent severe without psychotic features: Secondary | ICD-10-CM

## 2020-07-30 MED ORDER — METHOHEXITAL SODIUM 100 MG/10ML IV SOSY
PREFILLED_SYRINGE | INTRAVENOUS | Status: DC | PRN
  Administered 2020-07-30: 90 mg via INTRAVENOUS

## 2020-07-30 MED ORDER — METHOHEXITAL SODIUM 0.5 G IJ SOLR
INTRAMUSCULAR | Status: AC
  Filled 2020-07-30: qty 500

## 2020-07-30 MED ORDER — GLYCOPYRROLATE 0.2 MG/ML IJ SOLN
0.3000 mg | Freq: Once | INTRAMUSCULAR | Status: AC
  Administered 2020-07-30: 0.3 mg via INTRAVENOUS

## 2020-07-30 MED ORDER — GLYCOPYRROLATE 0.2 MG/ML IJ SOLN
INTRAMUSCULAR | Status: AC
  Filled 2020-07-30: qty 2

## 2020-07-30 MED ORDER — DEXMEDETOMIDINE (PRECEDEX) IN NS 20 MCG/5ML (4 MCG/ML) IV SYRINGE
PREFILLED_SYRINGE | INTRAVENOUS | Status: DC | PRN
  Administered 2020-07-30: 20 ug via INTRAVENOUS

## 2020-07-30 MED ORDER — LABETALOL HCL 5 MG/ML IV SOLN
INTRAVENOUS | Status: DC | PRN
  Administered 2020-07-30: 10 mg via INTRAVENOUS

## 2020-07-30 MED ORDER — HALOPERIDOL LACTATE 5 MG/ML IJ SOLN
INTRAMUSCULAR | Status: AC
  Filled 2020-07-30: qty 1

## 2020-07-30 MED ORDER — KETOROLAC TROMETHAMINE 30 MG/ML IJ SOLN
INTRAMUSCULAR | Status: AC
  Filled 2020-07-30: qty 1

## 2020-07-30 MED ORDER — MIDAZOLAM HCL 2 MG/2ML IJ SOLN
6.0000 mg | Freq: Once | INTRAMUSCULAR | Status: AC
  Administered 2020-07-30: 6 mg via INTRAVENOUS

## 2020-07-30 MED ORDER — HALOPERIDOL LACTATE 5 MG/ML IJ SOLN
INTRAMUSCULAR | Status: DC | PRN
  Administered 2020-07-30: 5 mg via INTRAVENOUS

## 2020-07-30 MED ORDER — KETOROLAC TROMETHAMINE 30 MG/ML IJ SOLN
30.0000 mg | Freq: Once | INTRAMUSCULAR | Status: AC
  Administered 2020-07-30: 30 mg via INTRAVENOUS

## 2020-07-30 MED ORDER — SODIUM CHLORIDE 0.9 % IV SOLN
500.0000 mL | Freq: Once | INTRAVENOUS | Status: AC
  Administered 2020-07-30: 500 mL via INTRAVENOUS

## 2020-07-30 MED ORDER — DEXMEDETOMIDINE (PRECEDEX) IN NS 20 MCG/5ML (4 MCG/ML) IV SYRINGE
PREFILLED_SYRINGE | INTRAVENOUS | Status: AC
  Filled 2020-07-30: qty 5

## 2020-07-30 MED ORDER — SUCCINYLCHOLINE CHLORIDE 200 MG/10ML IV SOSY
PREFILLED_SYRINGE | INTRAVENOUS | Status: AC
  Filled 2020-07-30: qty 10

## 2020-07-30 MED ORDER — SUCCINYLCHOLINE CHLORIDE 20 MG/ML IJ SOLN
INTRAMUSCULAR | Status: DC | PRN
  Administered 2020-07-30: 110 mg via INTRAVENOUS

## 2020-07-30 MED ORDER — MIDAZOLAM HCL 2 MG/2ML IJ SOLN
INTRAMUSCULAR | Status: AC
  Filled 2020-07-30: qty 6

## 2020-07-30 MED ORDER — SODIUM CHLORIDE 0.9 % IV SOLN: INTRAVENOUS | Status: DC | PRN

## 2020-07-30 MED ORDER — LABETALOL HCL 5 MG/ML IV SOLN
INTRAVENOUS | Status: AC
  Filled 2020-07-30: qty 4

## 2020-07-30 NOTE — Transfer of Care (Signed)
Immediate Anesthesia Transfer of Care Note  Patient: Abigail Higgins  Procedure(s) Performed: ECT TX  Patient Location: PACU  Anesthesia Type:General  Level of Consciousness: drowsy and patient cooperative  Airway & Oxygen Therapy: Patient Spontanous Breathing  Post-op Assessment: Report given to RN and Post -op Vital signs reviewed and stable  Post vital signs: Reviewed and stable  Last Vitals:  Vitals Value Taken Time  BP 116/80 07/30/20 1256  Temp    Pulse    Resp 20 07/30/20 1256  SpO2 97 % 07/30/20 1256    Last Pain:  Vitals:   07/30/20 1031  TempSrc:   PainSc: 0-No pain         Complications: No complications documented.

## 2020-07-30 NOTE — Anesthesia Postprocedure Evaluation (Signed)
Anesthesia Post Note  Patient: Abigail Higgins  Procedure(s) Performed: ECT TX  Patient location during evaluation: PACU Anesthesia Type: General Level of consciousness: awake and alert Pain management: pain level controlled Vital Signs Assessment: post-procedure vital signs reviewed and stable Respiratory status: spontaneous breathing, nonlabored ventilation, respiratory function stable and patient connected to nasal cannula oxygen Cardiovascular status: blood pressure returned to baseline and stable Postop Assessment: no apparent nausea or vomiting Anesthetic complications: no   No complications documented.   Last Vitals:  Vitals:   07/30/20 1330 07/30/20 1355  BP: 119/82 120/80  Pulse: 84 82  Resp:  16  Temp:  (!) 36.3 C  SpO2: 96%     Last Pain:  Vitals:   07/30/20 1355  TempSrc: Oral  PainSc: 0-No pain                 Lenard Simmer

## 2020-07-30 NOTE — Anesthesia Preprocedure Evaluation (Signed)
Anesthesia Evaluation  Patient identified by MRN, date of birth, ID band Patient awake    Reviewed: Allergy & Precautions, H&P , NPO status , Patient's Chart, lab work & pertinent test results, reviewed documented beta blocker date and time   Airway Mallampati: II   Neck ROM: full    Dental  (+) Poor Dentition   Pulmonary neg pulmonary ROS, former smoker,    Pulmonary exam normal        Cardiovascular Exercise Tolerance: Good negative cardio ROS Normal cardiovascular exam Rhythm:regular Rate:Normal     Neuro/Psych  Headaches, Anxiety Depression Bipolar Disorder negative psych ROS   GI/Hepatic Neg liver ROS, GERD  Medicated,  Endo/Other  negative endocrine ROS  Renal/GU negative Renal ROS  negative genitourinary   Musculoskeletal   Abdominal   Peds  Hematology negative hematology ROS (+)   Anesthesia Other Findings Past Medical History: No date: ADD (attention deficit disorder) No date: Anxiety No date: CIN I (cervical intraepithelial neoplasia I) No date: Depression No date: IC (interstitial cystitis) WATCHES DIET: Mild acid reflux URETHRAL SPRAYING: Urethral disorder Past Surgical History: No date: COLPOSCOPY 08/02/2012: CYSTOSCOPY WITH URETHRAL DILATATION; N/A     Comment:  Procedure: CYSTOSCOPY WITH URETHRAL DILATATION WITH               INSTILLATION OF PYRIDIUM AND PELVIC EXAM;  Surgeon:               Kathi Ludwig, MD;  Location: [facility];  Service: Urology;  Laterality: N/A; 03/05/2017: INTRAUTERINE DEVICE INSERTION     Comment:  MIRENA AGE 40: RHINOPLASTY AGE 26: TONSILLECTOMY BMI    Body Mass Index: 33.28 kg/m     Reproductive/Obstetrics negative OB ROS                             Anesthesia Physical  Anesthesia Plan  ASA: II  Anesthesia Plan: General   Post-op Pain Management:    Induction:   PONV Risk Score and Plan:    Airway Management Planned:   Additional Equipment:   Intra-op Plan:   Post-operative Plan:   Informed Consent: I have reviewed the patients History and Physical, chart, labs and discussed the procedure including the risks, benefits and alternatives for the proposed anesthesia with the patient or authorized representative who has indicated his/her understanding and acceptance.     Dental Advisory Given  Plan Discussed with: CRNA  Anesthesia Plan Comments:         Anesthesia Quick Evaluation

## 2020-07-30 NOTE — H&P (Signed)
Abigail Higgins is an 40 y.o. female.   Chief Complaint: Patient reports mood continues to be improved.  Does note some short-term memory impairment HPI: History of major depression has shown good response to ECT  Past Medical History:  Diagnosis Date  . ADD (attention deficit disorder)   . Anxiety   . CIN I (cervical intraepithelial neoplasia I)   . Depression   . IC (interstitial cystitis)   . Mild acid reflux WATCHES DIET  . Urethral disorder URETHRAL SPRAYING    Past Surgical History:  Procedure Laterality Date  . COLPOSCOPY    . CYSTOSCOPY WITH URETHRAL DILATATION N/A 08/02/2012   Procedure: CYSTOSCOPY WITH URETHRAL DILATATION WITH INSTILLATION OF PYRIDIUM AND PELVIC EXAM;  Surgeon: Kathi Ludwig, MD;  Location: [facility];  Service: Urology;  Laterality: N/A;  . INTRAUTERINE DEVICE INSERTION  03/05/2017   MIRENA  . RHINOPLASTY  AGE 38  . TONSILLECTOMY  AGE 72    Family History  Adopted: Yes   Social History:  reports that she quit smoking about 5 years ago. Her smoking use included cigarettes. She has a 3.50 pack-year smoking history. She has never used smokeless tobacco. She reports that she does not drink alcohol and does not use drugs.  Allergies: No Known Allergies  (Not in a hospital admission)   No results found for this or any previous visit (from the past 48 hour(s)). No results found.  Review of Systems  Constitutional: Negative.   HENT: Negative.   Eyes: Negative.   Respiratory: Negative.   Cardiovascular: Negative.   Gastrointestinal: Negative.   Musculoskeletal: Negative.   Skin: Negative.   Neurological: Negative.   Psychiatric/Behavioral: Negative.     Blood pressure 120/80, pulse 82, temperature (!) 97.3 F (36.3 C), temperature source Oral, resp. rate 16, height 5\' 5"  (1.651 m), weight 90.7 kg, SpO2 96 %. Physical Exam Vitals and nursing note reviewed.  Constitutional:      Appearance: She is well-developed.  HENT:      Head: Normocephalic and atraumatic.  Eyes:     Conjunctiva/sclera: Conjunctivae normal.     Pupils: Pupils are equal, round, and reactive to light.  Cardiovascular:     Heart sounds: Normal heart sounds.  Pulmonary:     Effort: Pulmonary effort is normal.  Abdominal:     Palpations: Abdomen is soft.  Musculoskeletal:        General: Normal range of motion.     Cervical back: Normal range of motion.  Skin:    General: Skin is warm and dry.  Neurological:     General: No focal deficit present.     Mental Status: She is alert.  Psychiatric:        Mood and Affect: Mood normal.        Cognition and Memory: Memory is impaired.      Assessment/Plan Last course of index treatment today.  We discussed options for maintenance.  It would be difficult given her need to get back to work but we agreed to try having a maintenance follow-up in 3 weeks.  , MD 07/30/2020, 5:56 PM

## 2020-07-30 NOTE — Procedures (Signed)
ECT SERVICES Physician's Interval Evaluation & Treatment Note  Patient Identification: Abigail Higgins MRN:  [id number] Date of Evaluation:  07/30/2020 TX #: [id number]  MADRS:   MMSE:   P.E. Findings:  No change to physical exam  Psychiatric Interval Note:  Mood continues to be reported as improved with some memory impairment  Subjective:  Patient is a 40 y.o. female seen for evaluation for Electroconvulsive Therapy. Notices short-term memory problems.  Mood better anxiety much better  Treatment Summary:   [x]   Right Unilateral             []  Bilateral   % Energy : 0.3 ms 65%   Impedance: 1610 ohms  Seizure Energy Index: 1714 V squared  Postictal Suppression Index: 61%  Seizure Concordance Index: 70%  Medications  Pre Shock: 0.3 mg Robinul 90 mg Brevital 110 mg succinylcholine  Post Shock: 6 mg Versed 5 mg Haloperidol 25 mcg Precedex  Seizure Duration: 14 seconds EMG 23 seconds EEG   Comments: Ending the index course we will see her back for maintenance in 3 weeks  Lungs:  [x]   Clear to auscultation               []  Other:   Heart:    [x]   Regular rhythm             []  irregular rhythm    [x]   Previous H&P reviewed, patient examined and there are NO CHANGES                 []   Previous H&P reviewed, patient examined and there are changes noted.   , MD 5/16/20226:00 PM

## 2020-07-31 DIAGNOSIS — F3176 Bipolar disorder, in full remission, most recent episode depressed: Secondary | ICD-10-CM | POA: Diagnosis not present

## 2020-07-31 DIAGNOSIS — F332 Major depressive disorder, recurrent severe without psychotic features: Secondary | ICD-10-CM | POA: Diagnosis not present

## 2020-07-31 DIAGNOSIS — F3174 Bipolar disorder, in full remission, most recent episode manic: Secondary | ICD-10-CM | POA: Diagnosis not present

## 2020-08-08 DIAGNOSIS — F3131 Bipolar disorder, current episode depressed, mild: Secondary | ICD-10-CM | POA: Diagnosis not present

## 2020-08-08 DIAGNOSIS — F3174 Bipolar disorder, in full remission, most recent episode manic: Secondary | ICD-10-CM | POA: Diagnosis not present

## 2020-08-08 DIAGNOSIS — F410 Panic disorder [episodic paroxysmal anxiety] without agoraphobia: Secondary | ICD-10-CM | POA: Diagnosis not present

## 2020-08-08 DIAGNOSIS — F4321 Adjustment disorder with depressed mood: Secondary | ICD-10-CM | POA: Diagnosis not present

## 2020-08-14 DIAGNOSIS — K5909 Other constipation: Secondary | ICD-10-CM | POA: Diagnosis not present

## 2020-08-14 DIAGNOSIS — R1013 Epigastric pain: Secondary | ICD-10-CM | POA: Diagnosis not present

## 2020-08-14 DIAGNOSIS — K921 Melena: Secondary | ICD-10-CM | POA: Diagnosis not present

## 2020-08-16 DIAGNOSIS — K5904 Chronic idiopathic constipation: Secondary | ICD-10-CM | POA: Diagnosis not present

## 2020-08-16 DIAGNOSIS — K921 Melena: Secondary | ICD-10-CM | POA: Diagnosis not present

## 2020-08-16 DIAGNOSIS — K649 Unspecified hemorrhoids: Secondary | ICD-10-CM | POA: Diagnosis not present

## 2020-08-18 DIAGNOSIS — F410 Panic disorder [episodic paroxysmal anxiety] without agoraphobia: Secondary | ICD-10-CM | POA: Diagnosis not present

## 2020-08-18 DIAGNOSIS — F3174 Bipolar disorder, in full remission, most recent episode manic: Secondary | ICD-10-CM | POA: Diagnosis not present

## 2020-08-18 DIAGNOSIS — F900 Attention-deficit hyperactivity disorder, predominantly inattentive type: Secondary | ICD-10-CM | POA: Diagnosis not present

## 2020-08-18 DIAGNOSIS — F3132 Bipolar disorder, current episode depressed, moderate: Secondary | ICD-10-CM | POA: Diagnosis not present

## 2020-08-20 ENCOUNTER — Encounter: Payer: Self-pay | Admitting: Anesthesiology

## 2020-08-20 ENCOUNTER — Other Ambulatory Visit: Payer: Self-pay | Admitting: Psychiatry

## 2020-08-20 ENCOUNTER — Inpatient Hospital Stay: Admission: RE | Admit: 2020-08-20 | Payer: BC Managed Care – PPO | Source: Ambulatory Visit

## 2020-08-21 DIAGNOSIS — H6123 Impacted cerumen, bilateral: Secondary | ICD-10-CM | POA: Diagnosis not present

## 2020-08-21 DIAGNOSIS — H66001 Acute suppurative otitis media without spontaneous rupture of ear drum, right ear: Secondary | ICD-10-CM | POA: Diagnosis not present

## 2020-09-06 ENCOUNTER — Encounter: Payer: Self-pay | Admitting: Nurse Practitioner

## 2020-09-07 DIAGNOSIS — K5909 Other constipation: Secondary | ICD-10-CM | POA: Diagnosis not present

## 2020-09-11 ENCOUNTER — Other Ambulatory Visit: Payer: Self-pay | Admitting: Nurse Practitioner

## 2020-09-11 DIAGNOSIS — Z1231 Encounter for screening mammogram for malignant neoplasm of breast: Secondary | ICD-10-CM

## 2020-09-21 DIAGNOSIS — R61 Generalized hyperhidrosis: Secondary | ICD-10-CM | POA: Diagnosis not present

## 2020-09-21 DIAGNOSIS — E559 Vitamin D deficiency, unspecified: Secondary | ICD-10-CM | POA: Diagnosis not present

## 2020-09-21 DIAGNOSIS — M62838 Other muscle spasm: Secondary | ICD-10-CM | POA: Diagnosis not present

## 2020-09-21 DIAGNOSIS — M545 Low back pain, unspecified: Secondary | ICD-10-CM | POA: Diagnosis not present

## 2020-09-23 DIAGNOSIS — E559 Vitamin D deficiency, unspecified: Secondary | ICD-10-CM | POA: Insufficient documentation

## 2020-09-24 DIAGNOSIS — F3132 Bipolar disorder, current episode depressed, moderate: Secondary | ICD-10-CM | POA: Diagnosis not present

## 2020-09-24 DIAGNOSIS — F3174 Bipolar disorder, in full remission, most recent episode manic: Secondary | ICD-10-CM | POA: Diagnosis not present

## 2020-09-24 DIAGNOSIS — F410 Panic disorder [episodic paroxysmal anxiety] without agoraphobia: Secondary | ICD-10-CM | POA: Diagnosis not present

## 2020-09-24 DIAGNOSIS — F900 Attention-deficit hyperactivity disorder, predominantly inattentive type: Secondary | ICD-10-CM | POA: Diagnosis not present

## 2020-11-06 ENCOUNTER — Ambulatory Visit
Admission: RE | Admit: 2020-11-06 | Discharge: 2020-11-06 | Disposition: A | Discharge status: Home / Self Care | Payer: BC Managed Care – PPO | Source: Ambulatory Visit | Attending: Nurse Practitioner | Admitting: Nurse Practitioner

## 2020-11-06 ENCOUNTER — Other Ambulatory Visit: Payer: Self-pay

## 2020-11-06 DIAGNOSIS — F332 Major depressive disorder, recurrent severe without psychotic features: Secondary | ICD-10-CM | POA: Diagnosis not present

## 2020-11-06 DIAGNOSIS — Z1231 Encounter for screening mammogram for malignant neoplasm of breast: Secondary | ICD-10-CM

## 2020-11-09 ENCOUNTER — Other Ambulatory Visit: Payer: Self-pay | Admitting: Nurse Practitioner

## 2020-11-09 DIAGNOSIS — R928 Other abnormal and inconclusive findings on diagnostic imaging of breast: Secondary | ICD-10-CM

## 2020-11-13 DIAGNOSIS — F332 Major depressive disorder, recurrent severe without psychotic features: Secondary | ICD-10-CM | POA: Diagnosis not present

## 2020-11-16 DIAGNOSIS — F332 Major depressive disorder, recurrent severe without psychotic features: Secondary | ICD-10-CM | POA: Diagnosis not present

## 2020-11-20 DIAGNOSIS — F332 Major depressive disorder, recurrent severe without psychotic features: Secondary | ICD-10-CM | POA: Diagnosis not present

## 2020-11-27 DIAGNOSIS — F332 Major depressive disorder, recurrent severe without psychotic features: Secondary | ICD-10-CM | POA: Diagnosis not present

## 2020-11-28 ENCOUNTER — Ambulatory Visit: Payer: BC Managed Care – PPO

## 2020-11-28 ENCOUNTER — Ambulatory Visit
Admission: RE | Admit: 2020-11-28 | Discharge: 2020-11-28 | Disposition: A | Discharge status: Home / Self Care | Payer: BC Managed Care – PPO | Source: Ambulatory Visit | Attending: Nurse Practitioner | Admitting: Nurse Practitioner

## 2020-11-28 ENCOUNTER — Other Ambulatory Visit: Payer: Self-pay

## 2020-11-28 DIAGNOSIS — R928 Other abnormal and inconclusive findings on diagnostic imaging of breast: Secondary | ICD-10-CM

## 2020-11-28 DIAGNOSIS — R922 Inconclusive mammogram: Secondary | ICD-10-CM | POA: Diagnosis not present

## 2020-11-30 DIAGNOSIS — F332 Major depressive disorder, recurrent severe without psychotic features: Secondary | ICD-10-CM | POA: Diagnosis not present

## 2020-12-04 DIAGNOSIS — F332 Major depressive disorder, recurrent severe without psychotic features: Secondary | ICD-10-CM | POA: Diagnosis not present

## 2020-12-06 DIAGNOSIS — F332 Major depressive disorder, recurrent severe without psychotic features: Secondary | ICD-10-CM | POA: Diagnosis not present

## 2020-12-08 DIAGNOSIS — F310 Bipolar disorder, current episode hypomanic: Secondary | ICD-10-CM | POA: Diagnosis not present

## 2020-12-11 DIAGNOSIS — F332 Major depressive disorder, recurrent severe without psychotic features: Secondary | ICD-10-CM | POA: Diagnosis not present

## 2020-12-12 DIAGNOSIS — F310 Bipolar disorder, current episode hypomanic: Secondary | ICD-10-CM | POA: Diagnosis not present

## 2020-12-14 DIAGNOSIS — F332 Major depressive disorder, recurrent severe without psychotic features: Secondary | ICD-10-CM | POA: Diagnosis not present

## 2020-12-18 ENCOUNTER — Other Ambulatory Visit: Payer: Self-pay

## 2020-12-18 ENCOUNTER — Encounter: Payer: Self-pay | Admitting: Obstetrics and Gynecology

## 2020-12-18 ENCOUNTER — Ambulatory Visit: Payer: BC Managed Care – PPO | Admitting: Obstetrics and Gynecology

## 2020-12-18 VITALS — BP 110/74 | HR 77 | Ht 65.0 in | Wt 206.0 lb

## 2020-12-18 DIAGNOSIS — F410 Panic disorder [episodic paroxysmal anxiety] without agoraphobia: Secondary | ICD-10-CM | POA: Diagnosis not present

## 2020-12-18 DIAGNOSIS — F3341 Major depressive disorder, recurrent, in partial remission: Secondary | ICD-10-CM | POA: Diagnosis not present

## 2020-12-18 DIAGNOSIS — G4700 Insomnia, unspecified: Secondary | ICD-10-CM | POA: Diagnosis not present

## 2020-12-18 DIAGNOSIS — N9089 Other specified noninflammatory disorders of vulva and perineum: Secondary | ICD-10-CM

## 2020-12-18 DIAGNOSIS — R300 Dysuria: Secondary | ICD-10-CM | POA: Diagnosis not present

## 2020-12-18 DIAGNOSIS — F900 Attention-deficit hyperactivity disorder, predominantly inattentive type: Secondary | ICD-10-CM | POA: Diagnosis not present

## 2020-12-18 LAB — URINALYSIS, COMPLETE W/RFL CULTURE
Bacteria, UA: NONE SEEN /HPF
Bilirubin Urine: NEGATIVE
Casts: NONE SEEN /LPF
Crystals: NONE SEEN /HPF
Glucose, UA: NEGATIVE
Hgb urine dipstick: NEGATIVE
Hyaline Cast: NONE SEEN /LPF
Ketones, ur: NEGATIVE
Leukocyte Esterase: NEGATIVE
Nitrites, Initial: NEGATIVE
Protein, ur: NEGATIVE
RBC / HPF: NONE SEEN /HPF (ref 0–2)
Specific Gravity, Urine: 1.002 (ref 1.001–1.035)
WBC, UA: NONE SEEN /HPF (ref 0–5)
Yeast: NONE SEEN /HPF
pH: 5.5 (ref 5.0–8.0)

## 2020-12-18 LAB — WET PREP FOR TRICH, YEAST, CLUE

## 2020-12-18 NOTE — Progress Notes (Signed)
GYNECOLOGY  VISIT   HPI: 40 y.o.   Married White or Caucasian Not Hispanic or Latino  female   [id number] with No LMP recorded. (Menstrual status: IUD).   here for dysuria. Patient states her symptoms started on Friday.   The process of urinating hurts, some irritation when she wipes. Mild urinary frequency and urgency, voiding normal amounts.  No baseline vulvar itching, burning or irritation. Only notices irritation when she wipes. No abnormal vaginal discharge. Burns every time she voids. Discomfort is a 4-5/10 in severity. She did buy a new sponge to wash in the shower and wonders if this is irritating her.   GYNECOLOGIC HISTORY: No LMP recorded. (Menstrual status: IUD). Contraception:IUD Menopausal hormone therapy: none         OB History     Gravida  0   Para  0   Term  0   Preterm  0   AB  0   Living  0      SAB  0   IAB  0   Ectopic  0   Multiple  0   Live Births  0              Patient Active Problem List   Diagnosis Date Noted   Vitamin D deficiency 09/23/2020   Unipolar affective disorder, depressive (HCC) 02/20/2020   Tachycardia 01/03/2017   Migraine 01/24/2016   Alcoholism in remission (HCC) 11/30/2014   History of alcoholism (HCC) 11/30/2014   Eustachian tube dysfunction 09/29/2014   Hyperlipidemia 06/26/2014   Cigarette smoker one half pack a day or less 06/01/2014   Former smoker 06/01/2014   Rash 03/14/2013   Other malaise and fatigue 03/14/2013   IC (interstitial cystitis) 12/15/2012   Adopted 05/18/2012   Weight gain 05/18/2012   Bipolar I disorder (HCC)    CIN I (cervical intraepithelial neoplasia I)     Past Medical History:  Diagnosis Date   ADD (attention deficit disorder)    Anxiety    CIN I (cervical intraepithelial neoplasia I)    Depression    IC (interstitial cystitis)    Mild acid reflux WATCHES DIET   Urethral disorder URETHRAL SPRAYING    Past Surgical History:  Procedure Laterality Date   COLPOSCOPY      CYSTOSCOPY WITH URETHRAL DILATATION N/A 08/02/2012   Procedure: CYSTOSCOPY WITH URETHRAL DILATATION WITH INSTILLATION OF PYRIDIUM AND PELVIC EXAM;  Surgeon: Kathi Ludwig, MD;  Location: [facility];  Service: Urology;  Laterality: N/A;   INTRAUTERINE DEVICE INSERTION  03/05/2017   MIRENA   RHINOPLASTY  AGE 61   TONSILLECTOMY  AGE 70    Current Outpatient Medications  Medication Sig Dispense Refill   Biotin 15176 MCG TABS Take by mouth.     doxepin (SINEQUAN) 10 MG capsule Take 10 mg by mouth. Up to 3 pills at night to sleep     levonorgestrel (MIRENA) 20 MCG/24HR IUD 1 each by Intrauterine route once. Reported on 05/29/2015     lisdexamfetamine (VYVANSE) 50 MG capsule Take 50 mg by mouth every morning.     omeprazole (PRILOSEC) 20 MG capsule Take 20 mg by mouth daily.     QUEtiapine (SEROQUEL) 300 MG tablet Take 600 mg by mouth at bedtime.     zinc gluconate 50 MG tablet Take 50 mg by mouth daily.     clonazePAM (KLONOPIN) 0.5 MG tablet Take by mouth.     lamoTRIgine (LAMICTAL) 25 MG tablet Take 25 mg by mouth  as directed. (Patient not taking: Reported on 12/18/2020)     lithium carbonate 300 MG capsule Take 300 mg by mouth at bedtime. (Patient not taking: Reported on 12/18/2020)     Vilazodone HCl (VIIBRYD) 40 MG TABS Take by mouth daily. (Patient not taking: Reported on 12/18/2020)     No current facility-administered medications for this visit.     ALLERGIES: Patient has no known allergies.  Family History  Adopted: Yes    Social History   Socioeconomic History   Marital status: Married    Spouse name: Not on file   Number of children: Not on file   Years of education: Not on file   Highest education level: Not on file  Occupational History   Not on file  Tobacco Use   Smoking status: Former    Packs/day: 0.50    Years: 7.00    Pack years: 3.50    Types: Cigarettes    Quit date: 01/16/2015    Years since quitting: 5.9   Smokeless tobacco: Never   Vaping Use   Vaping Use: Former  Substance and Sexual Activity   Alcohol use: No    Alcohol/week: 0.0 standard drinks   Drug use: No   Sexual activity: Yes    Birth control/protection: I.U.D., Condom    Comment: 1st intercourse- 13, partners- greater than 5 Mirena IUD 03/05/2017  Other Topics Concern   Not on file  Social History Narrative   Significant other - Rocky [employer]s   Works 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   PHYSICAL EXAMINATION:    BP 110/74   Pulse 77   Ht 5\' 5"  (1.651 m)   Wt 206 lb (93.4 kg) Comment: Has on work boots  SpO2 100%   BMI 34.28 kg/m     General appearance: alert, cooperative and appears stated age Abdomen: soft, mildly tender in the SP region; non distended, no masses,  no organomegaly CVA: not tender  Pelvic: External genitalia:  small laceration under her clitoris, above the urethra on the left.               Urethra:  normal appearing urethra with no masses, tenderness or lesions              Bartholins and Skenes: normal                 Vagina: normal appearing vagina with normal color and discharge, no lesions              Cervix: no lesions and IUD string 1-2 cm               Chaperone was present for exam.  1. Dysuria, frequency, urgency Suspect from the laceration, but she has some frequency, urgency and SP tenderness so I will send a culture - Urinalysis,Complete w/RFL Culture - Urine Culture  2. Vulvar irritation Small laceration - WET PREP FOR TRICH, YEAST, CLUE: negative -Discussed voiding in a sitz bath or using a peribottle when  voiding -Don't scrub the vulva, pat after voiding.  -  Use Vaseline  Over 20 minutes in total patient care

## 2020-12-19 DIAGNOSIS — F332 Major depressive disorder, recurrent severe without psychotic features: Secondary | ICD-10-CM | POA: Diagnosis not present

## 2020-12-19 DIAGNOSIS — I10 Essential (primary) hypertension: Secondary | ICD-10-CM | POA: Diagnosis not present

## 2020-12-19 DIAGNOSIS — Z5941 Food insecurity: Secondary | ICD-10-CM | POA: Diagnosis not present

## 2020-12-19 DIAGNOSIS — R61 Generalized hyperhidrosis: Secondary | ICD-10-CM | POA: Diagnosis not present

## 2020-12-19 LAB — URINE CULTURE
MICRO NUMBER:: 12457972
Result:: NO GROWTH
SPECIMEN QUALITY:: ADEQUATE

## 2020-12-20 DIAGNOSIS — F411 Generalized anxiety disorder: Secondary | ICD-10-CM | POA: Diagnosis not present

## 2020-12-21 DIAGNOSIS — F332 Major depressive disorder, recurrent severe without psychotic features: Secondary | ICD-10-CM | POA: Diagnosis not present

## 2020-12-25 DIAGNOSIS — F332 Major depressive disorder, recurrent severe without psychotic features: Secondary | ICD-10-CM | POA: Diagnosis not present

## 2020-12-27 DIAGNOSIS — F411 Generalized anxiety disorder: Secondary | ICD-10-CM | POA: Diagnosis not present

## 2020-12-28 DIAGNOSIS — F332 Major depressive disorder, recurrent severe without psychotic features: Secondary | ICD-10-CM | POA: Diagnosis not present

## 2020-12-31 DIAGNOSIS — H52203 Unspecified astigmatism, bilateral: Secondary | ICD-10-CM | POA: Diagnosis not present

## 2020-12-31 DIAGNOSIS — H10413 Chronic giant papillary conjunctivitis, bilateral: Secondary | ICD-10-CM | POA: Diagnosis not present

## 2021-01-01 DIAGNOSIS — F332 Major depressive disorder, recurrent severe without psychotic features: Secondary | ICD-10-CM | POA: Diagnosis not present

## 2021-01-04 DIAGNOSIS — F332 Major depressive disorder, recurrent severe without psychotic features: Secondary | ICD-10-CM | POA: Diagnosis not present

## 2021-01-08 DIAGNOSIS — F332 Major depressive disorder, recurrent severe without psychotic features: Secondary | ICD-10-CM | POA: Diagnosis not present

## 2021-01-09 DIAGNOSIS — K649 Unspecified hemorrhoids: Secondary | ICD-10-CM | POA: Diagnosis not present

## 2021-01-09 DIAGNOSIS — K5904 Chronic idiopathic constipation: Secondary | ICD-10-CM | POA: Diagnosis not present

## 2021-01-10 DIAGNOSIS — F411 Generalized anxiety disorder: Secondary | ICD-10-CM | POA: Diagnosis not present

## 2021-01-11 DIAGNOSIS — F332 Major depressive disorder, recurrent severe without psychotic features: Secondary | ICD-10-CM | POA: Diagnosis not present

## 2021-01-16 DIAGNOSIS — R61 Generalized hyperhidrosis: Secondary | ICD-10-CM | POA: Diagnosis not present

## 2021-01-16 DIAGNOSIS — Z23 Encounter for immunization: Secondary | ICD-10-CM | POA: Diagnosis not present

## 2021-01-16 DIAGNOSIS — I10 Essential (primary) hypertension: Secondary | ICD-10-CM | POA: Diagnosis not present

## 2021-01-16 DIAGNOSIS — R000 Tachycardia, unspecified: Secondary | ICD-10-CM | POA: Diagnosis not present

## 2021-01-16 DIAGNOSIS — R635 Abnormal weight gain: Secondary | ICD-10-CM | POA: Diagnosis not present

## 2021-01-16 DIAGNOSIS — L74519 Primary focal hyperhidrosis, unspecified: Secondary | ICD-10-CM | POA: Diagnosis not present

## 2021-01-16 DIAGNOSIS — F332 Major depressive disorder, recurrent severe without psychotic features: Secondary | ICD-10-CM | POA: Diagnosis not present

## 2021-01-17 DIAGNOSIS — F411 Generalized anxiety disorder: Secondary | ICD-10-CM | POA: Diagnosis not present

## 2021-01-18 DIAGNOSIS — F332 Major depressive disorder, recurrent severe without psychotic features: Secondary | ICD-10-CM | POA: Diagnosis not present

## 2021-01-22 DIAGNOSIS — F332 Major depressive disorder, recurrent severe without psychotic features: Secondary | ICD-10-CM | POA: Diagnosis not present

## 2021-01-24 DIAGNOSIS — F411 Generalized anxiety disorder: Secondary | ICD-10-CM | POA: Diagnosis not present

## 2021-01-25 DIAGNOSIS — F332 Major depressive disorder, recurrent severe without psychotic features: Secondary | ICD-10-CM | POA: Diagnosis not present

## 2021-01-28 DIAGNOSIS — F332 Major depressive disorder, recurrent severe without psychotic features: Secondary | ICD-10-CM | POA: Diagnosis not present

## 2021-01-30 DIAGNOSIS — F3341 Major depressive disorder, recurrent, in partial remission: Secondary | ICD-10-CM | POA: Diagnosis not present

## 2021-01-30 DIAGNOSIS — F332 Major depressive disorder, recurrent severe without psychotic features: Secondary | ICD-10-CM | POA: Diagnosis not present

## 2021-01-30 DIAGNOSIS — F410 Panic disorder [episodic paroxysmal anxiety] without agoraphobia: Secondary | ICD-10-CM | POA: Diagnosis not present

## 2021-01-31 DIAGNOSIS — F411 Generalized anxiety disorder: Secondary | ICD-10-CM | POA: Diagnosis not present

## 2021-02-04 ENCOUNTER — Other Ambulatory Visit: Payer: Self-pay

## 2021-02-04 ENCOUNTER — Ambulatory Visit: Payer: BC Managed Care – PPO | Admitting: Nurse Practitioner

## 2021-02-04 DIAGNOSIS — R8290 Unspecified abnormal findings in urine: Secondary | ICD-10-CM | POA: Diagnosis not present

## 2021-02-04 DIAGNOSIS — F332 Major depressive disorder, recurrent severe without psychotic features: Secondary | ICD-10-CM | POA: Diagnosis not present

## 2021-02-04 DIAGNOSIS — N3000 Acute cystitis without hematuria: Secondary | ICD-10-CM | POA: Diagnosis not present

## 2021-02-04 MED ORDER — SULFAMETHOXAZOLE-TRIMETHOPRIM 800-160 MG PO TABS: 1.0000 | ORAL_TABLET | Freq: Two times a day (BID) | ORAL | 0 refills | Status: DC

## 2021-02-04 NOTE — Progress Notes (Signed)
   Acute Office Visit  Subjective:    Patient ID: BONETTA MOSTEK, female    DOB: 11/30/1980, 40 y.o.   MRN: [id number]   HPI 40 y.o. presents today for malodorous urine that started 3 weeks ago. 4 days ago she began having dysuria, frequency and urgency.    Review of Systems  Constitutional: Negative.   Genitourinary:  Positive for dysuria, frequency and urgency. Negative for hematuria.      Objective:    Physical Exam Constitutional:      Appearance: Normal appearance.  Abdominal:     Tenderness: There is no right CVA tenderness or left CVA tenderness.    There were no vitals taken for this visit. Wt Readings from Last 3 Encounters:  12/18/20 206 lb (93.4 kg)  02/29/20 211 lb (95.7 kg)  02/16/19 180 lb (81.6 kg)   UA leukocytes 3+, nitrate positive, SG 1.020, yellow/cloudy. Microscopic: wbc 40-60, rbc 0-2, many bacteria     Assessment & Plan:   Problem List Items Addressed This Visit   None Visit Diagnoses     Acute cystitis without hematuria    -  Primary   Relevant Medications   sulfamethoxazole-trimethoprim (BACTRIM DS) 800-160 MG tablet   Abnormal urine odor       Relevant Orders   Urinalysis,Complete w/RFL Culture      Plan: UA suggestive of acute cystitis - Bactrim 800-165 mg BID x 3 days. Culture pending. Declined pyridium. Recommend increasing water intake.      Olivia Mackie DNP, 2:26 PM 02/04/2021

## 2021-02-06 DIAGNOSIS — F332 Major depressive disorder, recurrent severe without psychotic features: Secondary | ICD-10-CM | POA: Diagnosis not present

## 2021-02-07 LAB — URINE CULTURE
MICRO NUMBER:: 12663724
SPECIMEN QUALITY:: ADEQUATE

## 2021-02-07 LAB — URINALYSIS, COMPLETE W/RFL CULTURE
Bilirubin Urine: NEGATIVE
Casts: NONE SEEN /LPF
Crystals: NONE SEEN /HPF
Glucose, UA: NEGATIVE
Hyaline Cast: NONE SEEN /LPF
Nitrites, Initial: POSITIVE — AB
Protein, ur: NEGATIVE
Specific Gravity, Urine: 1.02 (ref 1.001–1.035)
Yeast: NONE SEEN /HPF
pH: 5.5 (ref 5.0–8.0)

## 2021-02-07 LAB — CULTURE INDICATED

## 2021-02-14 DIAGNOSIS — F411 Generalized anxiety disorder: Secondary | ICD-10-CM | POA: Diagnosis not present

## 2021-02-15 DIAGNOSIS — F332 Major depressive disorder, recurrent severe without psychotic features: Secondary | ICD-10-CM | POA: Diagnosis not present

## 2021-02-21 DIAGNOSIS — F411 Generalized anxiety disorder: Secondary | ICD-10-CM | POA: Diagnosis not present

## 2021-02-22 DIAGNOSIS — Z79899 Other long term (current) drug therapy: Secondary | ICD-10-CM | POA: Diagnosis not present

## 2021-02-22 DIAGNOSIS — Z1329 Encounter for screening for other suspected endocrine disorder: Secondary | ICD-10-CM | POA: Diagnosis not present

## 2021-02-22 DIAGNOSIS — Z130 Encounter for screening for diseases of the blood and blood-forming organs and certain disorders involving the immune mechanism: Secondary | ICD-10-CM | POA: Diagnosis not present

## 2021-02-22 DIAGNOSIS — E785 Hyperlipidemia, unspecified: Secondary | ICD-10-CM | POA: Diagnosis not present

## 2021-02-22 DIAGNOSIS — F332 Major depressive disorder, recurrent severe without psychotic features: Secondary | ICD-10-CM | POA: Diagnosis not present

## 2021-02-26 ENCOUNTER — Other Ambulatory Visit: Payer: Self-pay

## 2021-02-26 ENCOUNTER — Telehealth: Payer: Self-pay

## 2021-02-26 ENCOUNTER — Other Ambulatory Visit: Payer: BC Managed Care – PPO

## 2021-02-26 ENCOUNTER — Other Ambulatory Visit: Payer: Self-pay | Admitting: Nurse Practitioner

## 2021-02-26 DIAGNOSIS — N3000 Acute cystitis without hematuria: Secondary | ICD-10-CM

## 2021-02-26 DIAGNOSIS — R8290 Unspecified abnormal findings in urine: Secondary | ICD-10-CM

## 2021-02-26 DIAGNOSIS — R200 Anesthesia of skin: Secondary | ICD-10-CM

## 2021-02-26 MED ORDER — NITROFURANTOIN MONOHYD MACRO 100 MG PO CAPS: 100.0000 mg | ORAL_CAPSULE | Freq: Two times a day (BID) | ORAL | 0 refills | Status: DC

## 2021-02-26 NOTE — Telephone Encounter (Signed)
She will need to leave a urine sample. She was treated over 3 weeks ago. Could be a completely different bacteria and we need to culture it.

## 2021-02-26 NOTE — Telephone Encounter (Signed)
Patient c/o Urine with strong odor. SHe was here in office 02/04/21 for same thing and took Septra DS x 3 days. Sx improved but never went away.  SHe said she cannot afford copayment to come in again now because she is coming in 03/05/21 for her AEX with you.

## 2021-02-26 NOTE — Telephone Encounter (Signed)
Spoke with patient and informed her. Lab appt scheduled for this afternoon and lab order placed. She will come by to leave a urine specimen.

## 2021-02-28 DIAGNOSIS — F3111 Bipolar disorder, current episode manic without psychotic features, mild: Secondary | ICD-10-CM | POA: Diagnosis not present

## 2021-02-28 DIAGNOSIS — F411 Generalized anxiety disorder: Secondary | ICD-10-CM | POA: Diagnosis not present

## 2021-03-01 DIAGNOSIS — F332 Major depressive disorder, recurrent severe without psychotic features: Secondary | ICD-10-CM | POA: Diagnosis not present

## 2021-03-01 LAB — URINALYSIS, COMPLETE W/RFL CULTURE
Bilirubin Urine: NEGATIVE
Casts: NONE SEEN /LPF
Crystals: NONE SEEN /HPF
Glucose, UA: NEGATIVE
Hyaline Cast: NONE SEEN /LPF
Nitrites, Initial: POSITIVE — AB
Specific Gravity, Urine: 1.025 (ref 1.001–1.035)
WBC, UA: >=60 /HPF — AB (ref 0–5)
Yeast: NONE SEEN /HPF
pH: 5.5 (ref 5.0–8.0)

## 2021-03-01 LAB — URINE CULTURE
MICRO NUMBER:: 12750430
SPECIMEN QUALITY:: ADEQUATE

## 2021-03-01 LAB — CULTURE INDICATED

## 2021-03-05 ENCOUNTER — Ambulatory Visit (INDEPENDENT_AMBULATORY_CARE_PROVIDER_SITE_OTHER): Payer: BC Managed Care – PPO | Admitting: Nurse Practitioner

## 2021-03-05 ENCOUNTER — Other Ambulatory Visit: Payer: Self-pay

## 2021-03-05 ENCOUNTER — Encounter: Payer: Self-pay | Admitting: Nurse Practitioner

## 2021-03-05 VITALS — BP 124/78 | Ht 65.0 in | Wt 190.0 lb

## 2021-03-05 DIAGNOSIS — Z01419 Encounter for gynecological examination (general) (routine) without abnormal findings: Secondary | ICD-10-CM | POA: Diagnosis not present

## 2021-03-05 DIAGNOSIS — Z30431 Encounter for routine checking of intrauterine contraceptive device: Secondary | ICD-10-CM

## 2021-03-05 NOTE — Progress Notes (Signed)
° °  Abigail Higgins [birth year] [id number]   History:  40 y.o. G0 presents for annual exam. Mirena IUD inserted 02/2017, amenorrheic. 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 Depression/anxiety, bipolar disorder managed by psychiatry. Feels she is doing better emotionally on current regimen. She did 7 ECT treatments but only felt like it affected her memory. HTN managed by PCP, GI managing constipation, on Linzess. Currently being treat for UTI.   Gynecologic History No LMP recorded. (Menstrual status: IUD).   Contraception: IUD Sexually active: Yes  Health maintenance Last Pap: 02/16/2019. Results were: Normal, 5-year repeat Last mammogram: 11/06/2020. Results were: Normal after ultrasound Last Colonoscopy: Not indicated Last Dexa: Not indicated  Past medical history, past surgical history, family history and social history were all reviewed and documented in the EPIC chart. Married. Janitor for apartment complex. Has stepdaughter in [place] who has 5 and 16 yo girls.   ROS:  A ROS was performed and pertinent positives and negatives are included.  Exam:  Vitals:   03/05/21 0754  BP: 124/78  Weight: 190 lb (86.2 kg)  Height: 5\' 5"  (1.651 m)    Body mass index is 31.62 kg/m.  General appearance:  Normal Thyroid:  Symmetrical, normal in size, without palpable masses or nodularity. Respiratory  Auscultation:  Clear without wheezing or rhonchi Cardiovascular  Auscultation:  Regular rate, without rubs, murmurs or gallops  Edema/varicosities:  Not grossly evident Abdominal  Soft,nontender, without masses, guarding or rebound.  Liver/spleen:  No organomegaly noted  Hernia:  None appreciated  Skin  Inspection:  Grossly normal   Breasts: Examined lying and sitting.   Right: Without masses, retractions, discharge or axillary adenopathy.   Left: Without masses, retractions, discharge or axillary adenopathy. Genitourinary   Inguinal/mons:  Normal without inguinal adenopathy  External  genitalia:  Normal appearing vulva with no masses, tenderness, or lesions  BUS/Urethra/Skene's glands:  Normal  Vagina:  Normal appearing with normal color and discharge, no lesions  Cervix:  Normal appearing without discharge or lesions. IUD string visible  Uterus:  Normal in size, shape and contour.  Midline and mobile, nontender  Adnexa/parametria:     Rt: Normal in size, without masses or tenderness.   Lt: Normal in size, without masses or tenderness.  Anus and perineum: Normal  Digital rectal exam: Normal sphincter tone without palpated masses or tenderness  Patient informed chaperone available to be present for breast and pelvic exam. Patient has requested no chaperone to be present. Patient has been advised what will be completed during breast and pelvic exam.   Assessment/Plan:  40 y.o. G0 for annual exam.   Well female exam with routine gynecological exam - Education provided on SBEs, importance of preventative screenings, current guidelines, high calcium diet, regular exercise, and multivitamin daily. Labs with PCP.   Encounter for routine checking of intrauterine contraceptive device (IUD) - Mirena inserted 02/2017, amenorrheic. She is aware for 8-year FDA approval for pregnancy prevention.   Screening for cervical cancer - CIN-1 years ago, normal paps since. Will repeat pap at 5-year interval per guidelines.   Screening for breast cancer - Normal mammogram history.  Continue annual screenings.  Normal breast exam today.  Follow up in 1 year for annual.        03/2017 [facility], 8:11 AM 03/05/2021

## 2021-03-06 DIAGNOSIS — F332 Major depressive disorder, recurrent severe without psychotic features: Secondary | ICD-10-CM | POA: Diagnosis not present

## 2021-03-07 DIAGNOSIS — F411 Generalized anxiety disorder: Secondary | ICD-10-CM | POA: Diagnosis not present

## 2021-03-07 DIAGNOSIS — F3111 Bipolar disorder, current episode manic without psychotic features, mild: Secondary | ICD-10-CM | POA: Diagnosis not present

## 2021-03-14 DIAGNOSIS — F411 Generalized anxiety disorder: Secondary | ICD-10-CM | POA: Diagnosis not present

## 2021-03-14 DIAGNOSIS — F3111 Bipolar disorder, current episode manic without psychotic features, mild: Secondary | ICD-10-CM | POA: Diagnosis not present

## 2021-03-15 DIAGNOSIS — J029 Acute pharyngitis, unspecified: Secondary | ICD-10-CM | POA: Diagnosis not present

## 2021-03-15 DIAGNOSIS — R61 Generalized hyperhidrosis: Secondary | ICD-10-CM | POA: Diagnosis not present

## 2021-03-15 DIAGNOSIS — F332 Major depressive disorder, recurrent severe without psychotic features: Secondary | ICD-10-CM | POA: Diagnosis not present

## 2021-03-15 DIAGNOSIS — R682 Dry mouth, unspecified: Secondary | ICD-10-CM | POA: Diagnosis not present

## 2021-03-22 DIAGNOSIS — F332 Major depressive disorder, recurrent severe without psychotic features: Secondary | ICD-10-CM | POA: Diagnosis not present

## 2021-03-26 DIAGNOSIS — F332 Major depressive disorder, recurrent severe without psychotic features: Secondary | ICD-10-CM | POA: Diagnosis not present

## 2021-03-28 DIAGNOSIS — F411 Generalized anxiety disorder: Secondary | ICD-10-CM | POA: Diagnosis not present

## 2021-03-28 DIAGNOSIS — F4323 Adjustment disorder with mixed anxiety and depressed mood: Secondary | ICD-10-CM | POA: Diagnosis not present

## 2021-03-29 DIAGNOSIS — F332 Major depressive disorder, recurrent severe without psychotic features: Secondary | ICD-10-CM | POA: Diagnosis not present

## 2021-04-01 ENCOUNTER — Ambulatory Visit: Payer: BC Managed Care – PPO | Admitting: Nurse Practitioner

## 2021-04-01 ENCOUNTER — Other Ambulatory Visit: Payer: Self-pay

## 2021-04-01 ENCOUNTER — Encounter: Payer: Self-pay | Admitting: Nurse Practitioner

## 2021-04-01 VITALS — BP 118/76

## 2021-04-01 DIAGNOSIS — R300 Dysuria: Secondary | ICD-10-CM | POA: Diagnosis not present

## 2021-04-01 DIAGNOSIS — N3000 Acute cystitis without hematuria: Secondary | ICD-10-CM | POA: Diagnosis not present

## 2021-04-01 MED ORDER — CEFDINIR 300 MG PO CAPS
300.0000 mg | ORAL_CAPSULE | Freq: Two times a day (BID) | ORAL | 0 refills | Status: AC
Start: 2021-04-01 — End: 2021-04-08

## 2021-04-01 NOTE — Progress Notes (Signed)
° °  Acute Office Visit  Subjective:    Patient ID: Abigail Higgins, female    DOB: 1980-08-22, 41 y.o.   MRN: [id number]   HPI 41 y.o. presents today for dysuria, urgency and pelvic pressure that started a few days ago. E. Coli + UTI 02/04/2021, 02/26/2021. This is third UTI in less than 2 months. H/O urethral dilation in 2014.    Review of Systems  Constitutional: Negative.   Genitourinary:  Positive for dysuria, frequency, pelvic pain (Pressure) and urgency. Negative for difficulty urinating, flank pain and hematuria.      Objective:    Physical Exam Constitutional:      Appearance: Normal appearance.  Abdominal:     Tenderness: There is no right CVA tenderness or left CVA tenderness.  GU: Not indicated  BP 118/76  Wt Readings from Last 3 Encounters:  03/05/21 190 lb (86.2 kg)  12/18/20 206 lb (93.4 kg)  02/29/20 211 lb (95.7 kg)   UA: leukocytes 3+, nitrite positive, blood negative, SG 1.020, Cloudy/yellow. Microscopic: wbc 40-60, rbc none, many bacteria     Assessment & Plan:   Problem List Items Addressed This Visit   None Visit Diagnoses     Acute cystitis without hematuria    -  Primary   Relevant Medications   cefdinir (OMNICEF) 300 MG capsule   Dysuria       Relevant Orders   Urinalysis,Complete w/RFL Culture      Plan: Acute cystitis - Omnicef 300 mg BID x 7 days. Culture pending. Discussed recurrences and possible causes. Recommend avoiding tight under garments as she wears spanks to work daily and has active job, increase water intake, void often, disinfect or switch out sex toys, take probiotic. We also discussed urology evaluation if recurrence occurs. She is agreeable to plan.      [place], 4:15 PM 04/01/2021

## 2021-04-02 DIAGNOSIS — F332 Major depressive disorder, recurrent severe without psychotic features: Secondary | ICD-10-CM | POA: Diagnosis not present

## 2021-04-04 DIAGNOSIS — F411 Generalized anxiety disorder: Secondary | ICD-10-CM | POA: Diagnosis not present

## 2021-04-04 DIAGNOSIS — F331 Major depressive disorder, recurrent, moderate: Secondary | ICD-10-CM | POA: Diagnosis not present

## 2021-04-05 DIAGNOSIS — F332 Major depressive disorder, recurrent severe without psychotic features: Secondary | ICD-10-CM | POA: Diagnosis not present

## 2021-04-05 LAB — URINALYSIS, COMPLETE W/RFL CULTURE
Bilirubin Urine: NEGATIVE
Casts: NONE SEEN /LPF
Crystals: NONE SEEN /HPF
Glucose, UA: NEGATIVE
Hgb urine dipstick: NEGATIVE
Ketones, ur: NEGATIVE
Nitrites, Initial: POSITIVE — AB
Protein, ur: NEGATIVE
RBC / HPF: NONE SEEN /HPF (ref 0–2)
Specific Gravity, Urine: 1.02 (ref 1.001–1.035)
Yeast: NONE SEEN /HPF
pH: 6 (ref 5.0–8.0)

## 2021-04-05 LAB — CULTURE INDICATED

## 2021-04-05 LAB — URINE CULTURE
MICRO NUMBER:: 12880053
SPECIMEN QUALITY:: ADEQUATE

## 2021-04-08 ENCOUNTER — Encounter: Payer: Self-pay | Admitting: Nurse Practitioner

## 2021-04-08 ENCOUNTER — Other Ambulatory Visit: Payer: Self-pay | Admitting: Nurse Practitioner

## 2021-04-08 DIAGNOSIS — N762 Acute vulvitis: Secondary | ICD-10-CM

## 2021-04-08 MED ORDER — CLOBETASOL PROPIONATE 0.05 % EX OINT: 1.0000 "application " | TOPICAL_OINTMENT | Freq: Two times a day (BID) | CUTANEOUS | 0 refills | Status: AC

## 2021-04-12 DIAGNOSIS — F332 Major depressive disorder, recurrent severe without psychotic features: Secondary | ICD-10-CM | POA: Diagnosis not present

## 2021-04-16 DIAGNOSIS — F332 Major depressive disorder, recurrent severe without psychotic features: Secondary | ICD-10-CM | POA: Diagnosis not present

## 2021-04-24 DIAGNOSIS — F332 Major depressive disorder, recurrent severe without psychotic features: Secondary | ICD-10-CM | POA: Diagnosis not present

## 2021-04-26 DIAGNOSIS — F332 Major depressive disorder, recurrent severe without psychotic features: Secondary | ICD-10-CM | POA: Diagnosis not present

## 2021-05-03 DIAGNOSIS — F332 Major depressive disorder, recurrent severe without psychotic features: Secondary | ICD-10-CM | POA: Diagnosis not present

## 2021-05-07 DIAGNOSIS — F411 Generalized anxiety disorder: Secondary | ICD-10-CM | POA: Diagnosis not present

## 2021-05-07 DIAGNOSIS — F332 Major depressive disorder, recurrent severe without psychotic features: Secondary | ICD-10-CM | POA: Diagnosis not present

## 2021-05-07 DIAGNOSIS — F4389 Other reactions to severe stress: Secondary | ICD-10-CM | POA: Diagnosis not present

## 2021-05-09 DIAGNOSIS — F900 Attention-deficit hyperactivity disorder, predominantly inattentive type: Secondary | ICD-10-CM | POA: Diagnosis not present

## 2021-05-09 DIAGNOSIS — F331 Major depressive disorder, recurrent, moderate: Secondary | ICD-10-CM | POA: Diagnosis not present

## 2021-05-09 DIAGNOSIS — F410 Panic disorder [episodic paroxysmal anxiety] without agoraphobia: Secondary | ICD-10-CM | POA: Diagnosis not present

## 2021-05-10 DIAGNOSIS — F332 Major depressive disorder, recurrent severe without psychotic features: Secondary | ICD-10-CM | POA: Diagnosis not present

## 2021-05-11 DIAGNOSIS — F330 Major depressive disorder, recurrent, mild: Secondary | ICD-10-CM | POA: Diagnosis not present

## 2021-05-11 DIAGNOSIS — F411 Generalized anxiety disorder: Secondary | ICD-10-CM | POA: Diagnosis not present

## 2021-05-12 DIAGNOSIS — F411 Generalized anxiety disorder: Secondary | ICD-10-CM | POA: Diagnosis not present

## 2021-05-12 DIAGNOSIS — F330 Major depressive disorder, recurrent, mild: Secondary | ICD-10-CM | POA: Diagnosis not present

## 2021-05-13 DIAGNOSIS — F411 Generalized anxiety disorder: Secondary | ICD-10-CM | POA: Diagnosis not present

## 2021-05-13 DIAGNOSIS — F330 Major depressive disorder, recurrent, mild: Secondary | ICD-10-CM | POA: Diagnosis not present

## 2021-05-17 DIAGNOSIS — F332 Major depressive disorder, recurrent severe without psychotic features: Secondary | ICD-10-CM | POA: Diagnosis not present

## 2021-05-18 DIAGNOSIS — F411 Generalized anxiety disorder: Secondary | ICD-10-CM | POA: Diagnosis not present

## 2021-05-18 DIAGNOSIS — F330 Major depressive disorder, recurrent, mild: Secondary | ICD-10-CM | POA: Diagnosis not present

## 2021-05-21 DIAGNOSIS — F411 Generalized anxiety disorder: Secondary | ICD-10-CM | POA: Diagnosis not present

## 2021-05-21 DIAGNOSIS — F330 Major depressive disorder, recurrent, mild: Secondary | ICD-10-CM | POA: Diagnosis not present

## 2021-05-24 DIAGNOSIS — F332 Major depressive disorder, recurrent severe without psychotic features: Secondary | ICD-10-CM | POA: Diagnosis not present

## 2021-05-27 DIAGNOSIS — F411 Generalized anxiety disorder: Secondary | ICD-10-CM | POA: Diagnosis not present

## 2021-05-27 DIAGNOSIS — F330 Major depressive disorder, recurrent, mild: Secondary | ICD-10-CM | POA: Diagnosis not present

## 2021-05-29 DIAGNOSIS — F411 Generalized anxiety disorder: Secondary | ICD-10-CM | POA: Diagnosis not present

## 2021-05-29 DIAGNOSIS — F330 Major depressive disorder, recurrent, mild: Secondary | ICD-10-CM | POA: Diagnosis not present

## 2021-05-31 DIAGNOSIS — F332 Major depressive disorder, recurrent severe without psychotic features: Secondary | ICD-10-CM | POA: Diagnosis not present

## 2021-06-02 DIAGNOSIS — F330 Major depressive disorder, recurrent, mild: Secondary | ICD-10-CM | POA: Diagnosis not present

## 2021-06-02 DIAGNOSIS — F411 Generalized anxiety disorder: Secondary | ICD-10-CM | POA: Diagnosis not present

## 2021-06-06 DIAGNOSIS — F330 Major depressive disorder, recurrent, mild: Secondary | ICD-10-CM | POA: Diagnosis not present

## 2021-06-06 DIAGNOSIS — F411 Generalized anxiety disorder: Secondary | ICD-10-CM | POA: Diagnosis not present

## 2021-06-18 DIAGNOSIS — F332 Major depressive disorder, recurrent severe without psychotic features: Secondary | ICD-10-CM | POA: Diagnosis not present

## 2021-06-25 DIAGNOSIS — F332 Major depressive disorder, recurrent severe without psychotic features: Secondary | ICD-10-CM | POA: Diagnosis not present

## 2021-06-28 DIAGNOSIS — F332 Major depressive disorder, recurrent severe without psychotic features: Secondary | ICD-10-CM | POA: Diagnosis not present

## 2021-07-02 DIAGNOSIS — F332 Major depressive disorder, recurrent severe without psychotic features: Secondary | ICD-10-CM | POA: Diagnosis not present

## 2021-07-05 DIAGNOSIS — Z1329 Encounter for screening for other suspected endocrine disorder: Secondary | ICD-10-CM | POA: Diagnosis not present

## 2021-07-05 DIAGNOSIS — Z79899 Other long term (current) drug therapy: Secondary | ICD-10-CM | POA: Diagnosis not present

## 2021-07-05 DIAGNOSIS — Z130 Encounter for screening for diseases of the blood and blood-forming organs and certain disorders involving the immune mechanism: Secondary | ICD-10-CM | POA: Diagnosis not present

## 2021-07-05 DIAGNOSIS — F332 Major depressive disorder, recurrent severe without psychotic features: Secondary | ICD-10-CM | POA: Diagnosis not present

## 2021-07-05 DIAGNOSIS — E785 Hyperlipidemia, unspecified: Secondary | ICD-10-CM | POA: Diagnosis not present

## 2021-07-09 DIAGNOSIS — F332 Major depressive disorder, recurrent severe without psychotic features: Secondary | ICD-10-CM | POA: Diagnosis not present

## 2021-07-12 DIAGNOSIS — F332 Major depressive disorder, recurrent severe without psychotic features: Secondary | ICD-10-CM | POA: Diagnosis not present

## 2021-07-16 DIAGNOSIS — F411 Generalized anxiety disorder: Secondary | ICD-10-CM | POA: Diagnosis not present

## 2021-07-16 DIAGNOSIS — F4389 Other reactions to severe stress: Secondary | ICD-10-CM | POA: Diagnosis not present

## 2021-07-16 DIAGNOSIS — F4312 Post-traumatic stress disorder, chronic: Secondary | ICD-10-CM | POA: Diagnosis not present

## 2021-07-16 DIAGNOSIS — F332 Major depressive disorder, recurrent severe without psychotic features: Secondary | ICD-10-CM | POA: Diagnosis not present

## 2021-07-19 DIAGNOSIS — F332 Major depressive disorder, recurrent severe without psychotic features: Secondary | ICD-10-CM | POA: Diagnosis not present

## 2021-07-22 DIAGNOSIS — F331 Major depressive disorder, recurrent, moderate: Secondary | ICD-10-CM | POA: Diagnosis not present

## 2021-07-23 DIAGNOSIS — F411 Generalized anxiety disorder: Secondary | ICD-10-CM | POA: Diagnosis not present

## 2021-07-23 DIAGNOSIS — F332 Major depressive disorder, recurrent severe without psychotic features: Secondary | ICD-10-CM | POA: Diagnosis not present

## 2021-07-23 DIAGNOSIS — F4312 Post-traumatic stress disorder, chronic: Secondary | ICD-10-CM | POA: Diagnosis not present

## 2021-07-23 DIAGNOSIS — F4389 Other reactions to severe stress: Secondary | ICD-10-CM | POA: Diagnosis not present

## 2021-07-24 DIAGNOSIS — F332 Major depressive disorder, recurrent severe without psychotic features: Secondary | ICD-10-CM | POA: Diagnosis not present

## 2021-07-27 DIAGNOSIS — N3001 Acute cystitis with hematuria: Secondary | ICD-10-CM | POA: Diagnosis not present

## 2021-07-27 DIAGNOSIS — B3731 Acute candidiasis of vulva and vagina: Secondary | ICD-10-CM | POA: Diagnosis not present

## 2021-07-30 ENCOUNTER — Ambulatory Visit: Payer: BC Managed Care – PPO | Admitting: Nurse Practitioner

## 2021-07-31 DIAGNOSIS — L74519 Primary focal hyperhidrosis, unspecified: Secondary | ICD-10-CM | POA: Diagnosis not present

## 2021-07-31 DIAGNOSIS — F332 Major depressive disorder, recurrent severe without psychotic features: Secondary | ICD-10-CM | POA: Diagnosis not present

## 2021-07-31 DIAGNOSIS — D2239 Melanocytic nevi of other parts of face: Secondary | ICD-10-CM | POA: Diagnosis not present

## 2021-08-02 DIAGNOSIS — F900 Attention-deficit hyperactivity disorder, predominantly inattentive type: Secondary | ICD-10-CM | POA: Diagnosis not present

## 2021-08-02 DIAGNOSIS — F410 Panic disorder [episodic paroxysmal anxiety] without agoraphobia: Secondary | ICD-10-CM | POA: Diagnosis not present

## 2021-08-02 DIAGNOSIS — F3341 Major depressive disorder, recurrent, in partial remission: Secondary | ICD-10-CM | POA: Diagnosis not present

## 2021-08-06 DIAGNOSIS — F4312 Post-traumatic stress disorder, chronic: Secondary | ICD-10-CM | POA: Diagnosis not present

## 2021-08-06 DIAGNOSIS — F411 Generalized anxiety disorder: Secondary | ICD-10-CM | POA: Diagnosis not present

## 2021-08-06 DIAGNOSIS — F4389 Other reactions to severe stress: Secondary | ICD-10-CM | POA: Diagnosis not present

## 2021-08-06 DIAGNOSIS — F332 Major depressive disorder, recurrent severe without psychotic features: Secondary | ICD-10-CM | POA: Diagnosis not present

## 2021-08-09 DIAGNOSIS — F332 Major depressive disorder, recurrent severe without psychotic features: Secondary | ICD-10-CM | POA: Diagnosis not present

## 2021-08-13 DIAGNOSIS — F332 Major depressive disorder, recurrent severe without psychotic features: Secondary | ICD-10-CM | POA: Diagnosis not present

## 2021-08-14 DIAGNOSIS — F341 Dysthymic disorder: Secondary | ICD-10-CM | POA: Diagnosis not present

## 2021-08-14 DIAGNOSIS — F410 Panic disorder [episodic paroxysmal anxiety] without agoraphobia: Secondary | ICD-10-CM | POA: Diagnosis not present

## 2021-08-14 DIAGNOSIS — F3341 Major depressive disorder, recurrent, in partial remission: Secondary | ICD-10-CM | POA: Diagnosis not present

## 2021-08-14 DIAGNOSIS — F900 Attention-deficit hyperactivity disorder, predominantly inattentive type: Secondary | ICD-10-CM | POA: Diagnosis not present

## 2021-08-15 DIAGNOSIS — F332 Major depressive disorder, recurrent severe without psychotic features: Secondary | ICD-10-CM | POA: Diagnosis not present

## 2021-08-15 DIAGNOSIS — F4312 Post-traumatic stress disorder, chronic: Secondary | ICD-10-CM | POA: Diagnosis not present

## 2021-08-15 DIAGNOSIS — F411 Generalized anxiety disorder: Secondary | ICD-10-CM | POA: Diagnosis not present

## 2021-08-15 DIAGNOSIS — F4389 Other reactions to severe stress: Secondary | ICD-10-CM | POA: Diagnosis not present

## 2021-08-16 ENCOUNTER — Encounter (HOSPITAL_BASED_OUTPATIENT_CLINIC_OR_DEPARTMENT_OTHER): Payer: Self-pay

## 2021-08-16 ENCOUNTER — Emergency Department (HOSPITAL_BASED_OUTPATIENT_CLINIC_OR_DEPARTMENT_OTHER): Payer: BC Managed Care – PPO

## 2021-08-16 ENCOUNTER — Other Ambulatory Visit: Payer: Self-pay

## 2021-08-16 ENCOUNTER — Other Ambulatory Visit (HOSPITAL_BASED_OUTPATIENT_CLINIC_OR_DEPARTMENT_OTHER): Payer: Self-pay

## 2021-08-16 ENCOUNTER — Emergency Department (HOSPITAL_BASED_OUTPATIENT_CLINIC_OR_DEPARTMENT_OTHER)
Admission: EM | Admit: 2021-08-16 | Discharge: 2021-08-16 | Disposition: A | Discharge status: Home / Self Care | Payer: BC Managed Care – PPO | Attending: Emergency Medicine | Admitting: Emergency Medicine

## 2021-08-16 DIAGNOSIS — F332 Major depressive disorder, recurrent severe without psychotic features: Secondary | ICD-10-CM | POA: Diagnosis not present

## 2021-08-16 DIAGNOSIS — Z87891 Personal history of nicotine dependence: Secondary | ICD-10-CM | POA: Diagnosis not present

## 2021-08-16 DIAGNOSIS — R1013 Epigastric pain: Secondary | ICD-10-CM | POA: Insufficient documentation

## 2021-08-16 DIAGNOSIS — R0789 Other chest pain: Secondary | ICD-10-CM | POA: Diagnosis not present

## 2021-08-16 DIAGNOSIS — R079 Chest pain, unspecified: Secondary | ICD-10-CM | POA: Diagnosis not present

## 2021-08-16 DIAGNOSIS — R0602 Shortness of breath: Secondary | ICD-10-CM | POA: Diagnosis not present

## 2021-08-16 LAB — CBC
HCT: 38.2 % (ref 36.0–46.0)
Hemoglobin: 12.6 g/dL (ref 12.0–15.0)
MCH: 30.2 pg (ref 26.0–34.0)
MCHC: 33 g/dL (ref 30.0–36.0)
MCV: 91.6 fL (ref 80.0–100.0)
Platelets: 211 10*3/uL (ref 150–400)
RBC: 4.17 MIL/uL (ref 3.87–5.11)
RDW: 14.9 % (ref 11.5–15.5)
WBC: 5 10*3/uL (ref 4.0–10.5)
nRBC: 0 % (ref 0.0–0.2)

## 2021-08-16 LAB — COMPREHENSIVE METABOLIC PANEL
ALT: 16 U/L (ref 0–44)
AST: 19 U/L (ref 15–41)
Albumin: 4.4 g/dL (ref 3.5–5.0)
Alkaline Phosphatase: 65 U/L (ref 38–126)
Anion gap: 10 (ref 5–15)
BUN: 16 mg/dL (ref 6–20)
CO2: 23 mmol/L (ref 22–32)
Calcium: 10 mg/dL (ref 8.9–10.3)
Chloride: 108 mmol/L (ref 98–111)
Creatinine, Ser: 0.83 mg/dL (ref 0.44–1.00)
GFR, Estimated: >60 mL/min (ref >60)
Glucose, Bld: 94 mg/dL (ref 70–99)
Potassium: 3.8 mmol/L (ref 3.5–5.1)
Sodium: 141 mmol/L (ref 135–145)
Total Bilirubin: 1 mg/dL (ref 0.3–1.2)
Total Protein: 7.1 g/dL (ref 6.5–8.1)

## 2021-08-16 LAB — URINALYSIS, ROUTINE W REFLEX MICROSCOPIC
Bilirubin Urine: NEGATIVE
Glucose, UA: NEGATIVE mg/dL
Hgb urine dipstick: NEGATIVE
Ketones, ur: NEGATIVE mg/dL
Leukocytes,Ua: NEGATIVE
Nitrite: NEGATIVE
Protein, ur: NEGATIVE mg/dL
Specific Gravity, Urine: 1.015 (ref 1.005–1.030)
pH: 6.5 (ref 5.0–8.0)

## 2021-08-16 LAB — LIPASE, BLOOD: Lipase: 18 U/L (ref 11–51)

## 2021-08-16 LAB — TROPONIN I (HIGH SENSITIVITY)
Troponin I (High Sensitivity): <2 ng/L (ref <18)
Troponin I (High Sensitivity): <2 ng/L (ref <18)

## 2021-08-16 LAB — PROTIME-INR
INR: 1 (ref 0.8–1.2)
Prothrombin Time: 12.7 seconds (ref 11.4–15.2)

## 2021-08-16 MED ORDER — SUCRALFATE 1 G PO TABS
1.0000 g | ORAL_TABLET | Freq: Three times a day (TID) | ORAL | 0 refills | Status: DC
  Filled 2021-08-16: qty 28, 7d supply, fill #0

## 2021-08-16 MED ORDER — SODIUM CHLORIDE 0.9 % IV BOLUS
1000.0000 mL | Freq: Once | INTRAVENOUS | Status: AC
  Administered 2021-08-16: 1000 mL via INTRAVENOUS

## 2021-08-16 MED ORDER — FENTANYL CITRATE PF 50 MCG/ML IJ SOSY
50.0000 ug | PREFILLED_SYRINGE | Freq: Once | INTRAMUSCULAR | Status: AC
  Administered 2021-08-16: 50 ug via INTRAVENOUS
  Filled 2021-08-16: qty 1

## 2021-08-16 MED ORDER — FAMOTIDINE IN NACL 20-0.9 MG/50ML-% IV SOLN
20.0000 mg | Freq: Once | INTRAVENOUS | Status: AC
  Administered 2021-08-16: 20 mg via INTRAVENOUS
  Filled 2021-08-16: qty 50

## 2021-08-16 NOTE — ED Triage Notes (Signed)
Pt states that she is having CP with SOB since 5am, pt also having epigastric pain, denies n/v

## 2021-08-16 NOTE — ED Provider Notes (Signed)
[facility] Emergency Department Provider Note MRN:  [id number]  Arrival date & time: 08/16/21     Chief Complaint   Chest Pain   History of Present Illness   Abigail Higgins is a 41 y.o. year-old female with a history of anxiety, interstitial cystitis presenting to the ED with chief complaint of chest pain.  Sudden pain in the chest this morning at 5 AM while getting ready for work.  Described as severe.  Pain also in the epigastrium.  Pain is improved with time, currently only 4 out of 10.  No nausea, no vomiting, dull headache, no shortness of breath, no leg pain or swelling.  Has happened a few times in the past.  Review of Systems  A thorough review of systems was obtained and all systems are negative except as noted in the HPI and PMH.   Patient's Health History    Past Medical History:  Diagnosis Date   ADD (attention deficit disorder)    Anxiety    CIN I (cervical intraepithelial neoplasia I)    Depression    IC (interstitial cystitis)    Mild acid reflux WATCHES DIET   Urethral disorder URETHRAL SPRAYING    Past Surgical History:  Procedure Laterality Date   COLPOSCOPY     CYSTOSCOPY WITH URETHRAL DILATATION N/A 08/02/2012   Procedure: CYSTOSCOPY WITH URETHRAL DILATATION WITH INSTILLATION OF PYRIDIUM AND PELVIC EXAM;  Surgeon: Ailene Rud, MD;  Location: [place];  Service: Urology;  Laterality: N/A;   INTRAUTERINE DEVICE INSERTION  03/05/2017   MIRENA   RHINOPLASTY  AGE 22   TONSILLECTOMY  AGE 70    Family History  Adopted: Yes    Social History   Socioeconomic History   Marital status: Married    Spouse name: Not on file   Number of children: Not on file   Years of education: Not on file   Highest education level: Not on file  Occupational History   Not on file  Tobacco Use   Smoking status: Former    Packs/day: 0.50    Years: 7.00    Pack years: 3.50    Types: Cigarettes    Quit date: 01/16/2015     Years since quitting: 6.5   Smokeless tobacco: Never  Vaping Use   Vaping Use: Former  Substance and Sexual Activity   Alcohol use: No    Alcohol/week: 0.0 standard drinks   Drug use: No   Sexual activity: Yes    Birth control/protection: I.U.D., Condom    Comment: 1st intercourse- 13, partners- greater than 5 Mirena IUD 03/05/2017  Other Topics Concern   Not on file  Social History Narrative   Significant other - Vernetta Honey   Works at [employer] 2.5 years and [employer] for 4-5 years   Previous hx of substance abuse    Social Determinants of Radio broadcast assistant Strain: Not on file  Food Insecurity: Not on file  Transportation Needs: Not on file  Physical Activity: Not on file  Stress: Not on file  Social Connections: Not on file  Intimate Partner Violence: Not on file     Physical Exam   Vitals:   08/16/21 0613  BP: (!) 131/91  Pulse: 96  Resp: 16  Temp: 97.8 F (36.6 C)  SpO2: 97%    CONSTITUTIONAL: Well-appearing, NAD NEURO/PSYCH:  Alert and oriented x 3, no focal deficits EYES:  eyes equal and reactive ENT/NECK:  no LAD,  no JVD CARDIO: Regular rate, well-perfused, normal S1 and S2 PULM:  CTAB no wheezing or rhonchi GI/GU:  non-distended, non-tender MSK/SPINE:  No gross deformities, no edema SKIN:  no rash, atraumatic   *Additional and/or pertinent findings included in MDM below  Diagnostic and Interventional Summary    EKG Interpretation  Date/Time:  Friday August 16 2021 06:15:24 EDT Ventricular Rate:  89 PR Interval:  169 QRS Duration: 65 QT Interval:  416 QTC Calculation: 507 R Axis:   1 Text Interpretation: Sinus rhythm Low voltage, precordial leads Borderline T wave abnormalities Borderline prolonged QT interval Confirmed by Gerlene Fee [phone]) on 08/16/2021 6:36:31 AM       Labs Reviewed  CBC  COMPREHENSIVE METABOLIC PANEL  LIPASE, BLOOD  PROTIME-INR  URINALYSIS, ROUTINE W REFLEX MICROSCOPIC  TROPONIN  I (HIGH SENSITIVITY)    DG Chest Port 1 View  Final Result      Medications  fentaNYL (SUBLIMAZE) injection 50 mcg (has no administration in time range)  famotidine (PEPCID) IVPB 20 mg premix (has no administration in time range)     Procedures  /  Critical Care Procedures  ED Course and Medical Decision Making  Initial Impression and Ddx Differential diagnosis includes ACS, pancreatitis, GERD, overall today very low concern for PE, PERC negative.  Dissection is felt to be extremely unlikely given that it is happened multiple times in the past.  Awaiting labs, chest x-ray.  Will need 2 troponins.  Anticipating discharge if reassuring work-up.  Past medical/surgical history that increases complexity of ED encounter: None  Interpretation of Diagnostics I personally reviewed the EKG and my interpretation is as follows: Sinus rhythm without concerning ischemic features  Labs pending  Patient Reassessment and Ultimate Disposition/Management     Signed out to oncoming provider.  Patient management required discussion with the following services or consulting groups:  None  Complexity of Problems Addressed Acute illness or injury that poses threat of life of bodily function  Additional Data Reviewed and Analyzed Further history obtained from: Further history from spouse/family member  Additional Factors Impacting ED Encounter Risk None  Barth Kirks. Sedonia Small, Katie [email or web address]  Final Clinical Impressions(s) / ED Diagnoses     ICD-10-CM   1. Chest pain, unspecified type  R07.9       ED Discharge Orders     None        Discharge Instructions Discussed with and Provided to Patient:   Discharge Instructions   None      Maudie Flakes, MD 08/16/21 [phone]

## 2021-08-16 NOTE — ED Notes (Signed)
PCXR completed at this time.

## 2021-08-16 NOTE — ED Notes (Signed)
RT note: Pt. ambulated to RR independently, placed back on telemetry/BP./P.O. after returning.

## 2021-08-16 NOTE — ED Notes (Signed)
RT note: Unable to drawl second Troponin off of R IV, RN made aware.

## 2021-08-16 NOTE — ED Provider Notes (Signed)
  Provider Note MRN:  [id number]  Arrival date & time: 08/16/21    ED Course and Medical Decision Making  Assumed care from [facility] at shift change.  See not from prior team for complete details, in brief:  41 year old female with history of anxiety, interstitial cystitis to the ED for chest pain Onset of pain around 5 AM this morning while getting up for work; pain midsternal and epigastrium.  Reports is happened in the past with similar circumstances  Plan per prior physician reassessment, delta troponin  Labs reviewed and are stable Suspicion for ACS is low HEART score is 0  The patient's chest pain is not suggestive of pulmonary embolus, cardiac ischemia, aortic dissection, pericarditis, myocarditis, pulmonary embolism, pneumothorax, pneumonia, Zoster, or esophageal perforation, or other serious etiology.  Historically not abrupt in onset, tearing or ripping, pulses symmetric. EKG nonspecific for ischemia/infarction. No dysrhythmias, brugada, WPW, prolonged QT noted.   CXR neg, trop neg x2  Given the extremely low risk of these diagnoses further testing and evaluation for these possibilities does not appear to be indicated at this time. Patient in no distress and overall condition improved here in the ED. Detailed discussions were had with the patient regarding current findings, and need for close f/u with PCP or on call doctor. The patient has been instructed to return immediately if the symptoms worsen in any way for re-evaluation. Patient verbalized understanding and is in agreement with current care plan. All questions answered prior to discharge.   Procedures  Final Clinical Impressions(s) / ED Diagnoses     ICD-10-CM   1. Atypical chest pain  R07.89       ED Discharge Orders          Ordered    sucralfate (CARAFATE) 1 g tablet  3 times daily with meals & bedtime        08/16/21 1013              Discharge Instructions      Return to the Emergency Department if  you have unusual chest pain, pressure, or discomfort, shortness of breath, nausea, vomiting, burping, heartburn, tingling upper body parts, sweating, cold, clammy skin, or racing heartbeat. Call 911 if you think you are having a heart attack. Take all cardiac medications as prescribed - notify your doctor if you have any side effects. Follow cardiac diet - avoid fatty & fried foods, don't eat too much red meat, eat lots of fruits & vegetables, and dairy products should be low fat. Please lose weight if you are overweight. Become more active with walking, gardening, or any other activity that gets you to moving.   Please return to the emergency department immediately for any new or concerning symptoms, or if you get worse.          Tanda Rockers A, DO 08/16/21 1022

## 2021-08-16 NOTE — ED Notes (Signed)
Discharge instructions, follow up care, and prescriptions reviewed and explained, pt verbalized understanding. Pt caox4 and ambulatory on departure.  

## 2021-08-16 NOTE — Discharge Instructions (Signed)

## 2021-08-20 DIAGNOSIS — F411 Generalized anxiety disorder: Secondary | ICD-10-CM | POA: Diagnosis not present

## 2021-08-20 DIAGNOSIS — F4389 Other reactions to severe stress: Secondary | ICD-10-CM | POA: Diagnosis not present

## 2021-08-20 DIAGNOSIS — F4312 Post-traumatic stress disorder, chronic: Secondary | ICD-10-CM | POA: Diagnosis not present

## 2021-08-20 DIAGNOSIS — F332 Major depressive disorder, recurrent severe without psychotic features: Secondary | ICD-10-CM | POA: Diagnosis not present

## 2021-08-22 DIAGNOSIS — F332 Major depressive disorder, recurrent severe without psychotic features: Secondary | ICD-10-CM | POA: Diagnosis not present

## 2021-08-22 DIAGNOSIS — F4312 Post-traumatic stress disorder, chronic: Secondary | ICD-10-CM | POA: Diagnosis not present

## 2021-08-22 DIAGNOSIS — F411 Generalized anxiety disorder: Secondary | ICD-10-CM | POA: Diagnosis not present

## 2021-08-22 DIAGNOSIS — F4389 Other reactions to severe stress: Secondary | ICD-10-CM | POA: Diagnosis not present

## 2021-08-23 DIAGNOSIS — F419 Anxiety disorder, unspecified: Secondary | ICD-10-CM | POA: Diagnosis not present

## 2021-08-23 DIAGNOSIS — I10 Essential (primary) hypertension: Secondary | ICD-10-CM | POA: Diagnosis not present

## 2021-08-27 DIAGNOSIS — F332 Major depressive disorder, recurrent severe without psychotic features: Secondary | ICD-10-CM | POA: Diagnosis not present

## 2021-08-27 DIAGNOSIS — F4389 Other reactions to severe stress: Secondary | ICD-10-CM | POA: Diagnosis not present

## 2021-08-27 DIAGNOSIS — F4312 Post-traumatic stress disorder, chronic: Secondary | ICD-10-CM | POA: Diagnosis not present

## 2021-08-27 DIAGNOSIS — F411 Generalized anxiety disorder: Secondary | ICD-10-CM | POA: Diagnosis not present

## 2021-08-30 DIAGNOSIS — F332 Major depressive disorder, recurrent severe without psychotic features: Secondary | ICD-10-CM | POA: Diagnosis not present

## 2021-09-02 DIAGNOSIS — F332 Major depressive disorder, recurrent severe without psychotic features: Secondary | ICD-10-CM | POA: Diagnosis not present

## 2021-09-03 DIAGNOSIS — F332 Major depressive disorder, recurrent severe without psychotic features: Secondary | ICD-10-CM | POA: Diagnosis not present

## 2021-09-03 DIAGNOSIS — F4389 Other reactions to severe stress: Secondary | ICD-10-CM | POA: Diagnosis not present

## 2021-09-03 DIAGNOSIS — F411 Generalized anxiety disorder: Secondary | ICD-10-CM | POA: Diagnosis not present

## 2021-09-03 DIAGNOSIS — F4312 Post-traumatic stress disorder, chronic: Secondary | ICD-10-CM | POA: Diagnosis not present

## 2021-09-05 DIAGNOSIS — F4312 Post-traumatic stress disorder, chronic: Secondary | ICD-10-CM | POA: Diagnosis not present

## 2021-09-05 DIAGNOSIS — F411 Generalized anxiety disorder: Secondary | ICD-10-CM | POA: Diagnosis not present

## 2021-09-05 DIAGNOSIS — F4389 Other reactions to severe stress: Secondary | ICD-10-CM | POA: Diagnosis not present

## 2021-09-05 DIAGNOSIS — F332 Major depressive disorder, recurrent severe without psychotic features: Secondary | ICD-10-CM | POA: Diagnosis not present

## 2021-09-06 DIAGNOSIS — K921 Melena: Secondary | ICD-10-CM | POA: Diagnosis not present

## 2021-09-06 DIAGNOSIS — K649 Unspecified hemorrhoids: Secondary | ICD-10-CM | POA: Diagnosis not present

## 2021-09-06 DIAGNOSIS — K5904 Chronic idiopathic constipation: Secondary | ICD-10-CM | POA: Diagnosis not present

## 2021-09-10 DIAGNOSIS — F332 Major depressive disorder, recurrent severe without psychotic features: Secondary | ICD-10-CM | POA: Diagnosis not present

## 2021-09-10 DIAGNOSIS — F411 Generalized anxiety disorder: Secondary | ICD-10-CM | POA: Diagnosis not present

## 2021-09-10 DIAGNOSIS — F4312 Post-traumatic stress disorder, chronic: Secondary | ICD-10-CM | POA: Diagnosis not present

## 2021-09-10 DIAGNOSIS — F4389 Other reactions to severe stress: Secondary | ICD-10-CM | POA: Diagnosis not present

## 2021-09-12 DIAGNOSIS — F332 Major depressive disorder, recurrent severe without psychotic features: Secondary | ICD-10-CM | POA: Diagnosis not present

## 2021-09-12 DIAGNOSIS — F411 Generalized anxiety disorder: Secondary | ICD-10-CM | POA: Diagnosis not present

## 2021-09-12 DIAGNOSIS — F4389 Other reactions to severe stress: Secondary | ICD-10-CM | POA: Diagnosis not present

## 2021-09-12 DIAGNOSIS — F4312 Post-traumatic stress disorder, chronic: Secondary | ICD-10-CM | POA: Diagnosis not present

## 2021-09-13 DIAGNOSIS — F332 Major depressive disorder, recurrent severe without psychotic features: Secondary | ICD-10-CM | POA: Diagnosis not present

## 2021-09-16 DIAGNOSIS — F332 Major depressive disorder, recurrent severe without psychotic features: Secondary | ICD-10-CM | POA: Diagnosis not present

## 2021-09-17 DIAGNOSIS — F411 Generalized anxiety disorder: Secondary | ICD-10-CM | POA: Diagnosis not present

## 2021-09-17 DIAGNOSIS — F4312 Post-traumatic stress disorder, chronic: Secondary | ICD-10-CM | POA: Diagnosis not present

## 2021-09-17 DIAGNOSIS — F332 Major depressive disorder, recurrent severe without psychotic features: Secondary | ICD-10-CM | POA: Diagnosis not present

## 2021-09-17 DIAGNOSIS — F4389 Other reactions to severe stress: Secondary | ICD-10-CM | POA: Diagnosis not present

## 2021-09-19 DIAGNOSIS — F4389 Other reactions to severe stress: Secondary | ICD-10-CM | POA: Diagnosis not present

## 2021-09-19 DIAGNOSIS — F4312 Post-traumatic stress disorder, chronic: Secondary | ICD-10-CM | POA: Diagnosis not present

## 2021-09-19 DIAGNOSIS — F411 Generalized anxiety disorder: Secondary | ICD-10-CM | POA: Diagnosis not present

## 2021-09-19 DIAGNOSIS — F332 Major depressive disorder, recurrent severe without psychotic features: Secondary | ICD-10-CM | POA: Diagnosis not present

## 2021-09-20 DIAGNOSIS — F332 Major depressive disorder, recurrent severe without psychotic features: Secondary | ICD-10-CM | POA: Diagnosis not present

## 2021-10-01 DIAGNOSIS — F332 Major depressive disorder, recurrent severe without psychotic features: Secondary | ICD-10-CM | POA: Diagnosis not present

## 2021-10-01 DIAGNOSIS — F4389 Other reactions to severe stress: Secondary | ICD-10-CM | POA: Diagnosis not present

## 2021-10-01 DIAGNOSIS — F4312 Post-traumatic stress disorder, chronic: Secondary | ICD-10-CM | POA: Diagnosis not present

## 2021-10-01 DIAGNOSIS — F411 Generalized anxiety disorder: Secondary | ICD-10-CM | POA: Diagnosis not present

## 2021-10-02 DIAGNOSIS — D235 Other benign neoplasm of skin of trunk: Secondary | ICD-10-CM | POA: Diagnosis not present

## 2021-10-02 DIAGNOSIS — L74511 Primary focal hyperhidrosis, face: Secondary | ICD-10-CM | POA: Diagnosis not present

## 2021-10-02 DIAGNOSIS — L728 Other follicular cysts of the skin and subcutaneous tissue: Secondary | ICD-10-CM | POA: Diagnosis not present

## 2021-10-02 DIAGNOSIS — L298 Other pruritus: Secondary | ICD-10-CM | POA: Diagnosis not present

## 2021-10-02 DIAGNOSIS — L538 Other specified erythematous conditions: Secondary | ICD-10-CM | POA: Diagnosis not present

## 2021-10-02 DIAGNOSIS — L74519 Primary focal hyperhidrosis, unspecified: Secondary | ICD-10-CM | POA: Diagnosis not present

## 2021-10-04 DIAGNOSIS — F332 Major depressive disorder, recurrent severe without psychotic features: Secondary | ICD-10-CM | POA: Diagnosis not present

## 2021-10-11 DIAGNOSIS — F332 Major depressive disorder, recurrent severe without psychotic features: Secondary | ICD-10-CM | POA: Diagnosis not present

## 2021-10-15 DIAGNOSIS — F332 Major depressive disorder, recurrent severe without psychotic features: Secondary | ICD-10-CM | POA: Diagnosis not present

## 2021-10-15 DIAGNOSIS — F4389 Other reactions to severe stress: Secondary | ICD-10-CM | POA: Diagnosis not present

## 2021-10-15 DIAGNOSIS — F4312 Post-traumatic stress disorder, chronic: Secondary | ICD-10-CM | POA: Diagnosis not present

## 2021-10-15 DIAGNOSIS — F411 Generalized anxiety disorder: Secondary | ICD-10-CM | POA: Diagnosis not present

## 2021-10-17 DIAGNOSIS — F4312 Post-traumatic stress disorder, chronic: Secondary | ICD-10-CM | POA: Diagnosis not present

## 2021-10-17 DIAGNOSIS — F411 Generalized anxiety disorder: Secondary | ICD-10-CM | POA: Diagnosis not present

## 2021-10-17 DIAGNOSIS — F332 Major depressive disorder, recurrent severe without psychotic features: Secondary | ICD-10-CM | POA: Diagnosis not present

## 2021-10-17 DIAGNOSIS — F603 Borderline personality disorder: Secondary | ICD-10-CM | POA: Diagnosis not present

## 2021-10-18 DIAGNOSIS — F332 Major depressive disorder, recurrent severe without psychotic features: Secondary | ICD-10-CM | POA: Diagnosis not present

## 2021-10-22 DIAGNOSIS — F332 Major depressive disorder, recurrent severe without psychotic features: Secondary | ICD-10-CM | POA: Diagnosis not present

## 2021-10-22 DIAGNOSIS — F4312 Post-traumatic stress disorder, chronic: Secondary | ICD-10-CM | POA: Diagnosis not present

## 2021-10-22 DIAGNOSIS — F603 Borderline personality disorder: Secondary | ICD-10-CM | POA: Diagnosis not present

## 2021-10-22 DIAGNOSIS — F411 Generalized anxiety disorder: Secondary | ICD-10-CM | POA: Diagnosis not present

## 2021-10-24 ENCOUNTER — Ambulatory Visit: Payer: BC Managed Care – PPO | Admitting: Physician Assistant

## 2021-10-24 DIAGNOSIS — F332 Major depressive disorder, recurrent severe without psychotic features: Secondary | ICD-10-CM | POA: Diagnosis not present

## 2021-10-24 DIAGNOSIS — F411 Generalized anxiety disorder: Secondary | ICD-10-CM | POA: Diagnosis not present

## 2021-10-24 DIAGNOSIS — F4312 Post-traumatic stress disorder, chronic: Secondary | ICD-10-CM | POA: Diagnosis not present

## 2021-10-24 DIAGNOSIS — F603 Borderline personality disorder: Secondary | ICD-10-CM | POA: Diagnosis not present

## 2021-10-25 DIAGNOSIS — F332 Major depressive disorder, recurrent severe without psychotic features: Secondary | ICD-10-CM | POA: Diagnosis not present

## 2021-10-26 DIAGNOSIS — F3341 Major depressive disorder, recurrent, in partial remission: Secondary | ICD-10-CM | POA: Diagnosis not present

## 2021-10-29 DIAGNOSIS — F603 Borderline personality disorder: Secondary | ICD-10-CM | POA: Diagnosis not present

## 2021-10-29 DIAGNOSIS — F4312 Post-traumatic stress disorder, chronic: Secondary | ICD-10-CM | POA: Diagnosis not present

## 2021-10-29 DIAGNOSIS — F332 Major depressive disorder, recurrent severe without psychotic features: Secondary | ICD-10-CM | POA: Diagnosis not present

## 2021-10-29 DIAGNOSIS — F4389 Other reactions to severe stress: Secondary | ICD-10-CM | POA: Diagnosis not present

## 2021-10-29 DIAGNOSIS — F411 Generalized anxiety disorder: Secondary | ICD-10-CM | POA: Diagnosis not present

## 2021-11-05 DIAGNOSIS — F4312 Post-traumatic stress disorder, chronic: Secondary | ICD-10-CM | POA: Diagnosis not present

## 2021-11-05 DIAGNOSIS — F332 Major depressive disorder, recurrent severe without psychotic features: Secondary | ICD-10-CM | POA: Diagnosis not present

## 2021-11-05 DIAGNOSIS — F411 Generalized anxiety disorder: Secondary | ICD-10-CM | POA: Diagnosis not present

## 2021-11-05 DIAGNOSIS — F4389 Other reactions to severe stress: Secondary | ICD-10-CM | POA: Diagnosis not present

## 2021-11-05 DIAGNOSIS — F603 Borderline personality disorder: Secondary | ICD-10-CM | POA: Diagnosis not present

## 2021-11-07 DIAGNOSIS — F411 Generalized anxiety disorder: Secondary | ICD-10-CM | POA: Diagnosis not present

## 2021-11-07 DIAGNOSIS — F4312 Post-traumatic stress disorder, chronic: Secondary | ICD-10-CM | POA: Diagnosis not present

## 2021-11-07 DIAGNOSIS — F603 Borderline personality disorder: Secondary | ICD-10-CM | POA: Diagnosis not present

## 2021-11-07 DIAGNOSIS — F4389 Other reactions to severe stress: Secondary | ICD-10-CM | POA: Diagnosis not present

## 2021-11-11 DIAGNOSIS — Z6827 Body mass index (BMI) 27.0-27.9, adult: Secondary | ICD-10-CM | POA: Diagnosis not present

## 2021-11-11 DIAGNOSIS — J0100 Acute maxillary sinusitis, unspecified: Secondary | ICD-10-CM | POA: Diagnosis not present

## 2021-11-11 DIAGNOSIS — J309 Allergic rhinitis, unspecified: Secondary | ICD-10-CM | POA: Diagnosis not present

## 2021-11-13 DIAGNOSIS — J019 Acute sinusitis, unspecified: Secondary | ICD-10-CM | POA: Diagnosis not present

## 2021-11-13 DIAGNOSIS — F332 Major depressive disorder, recurrent severe without psychotic features: Secondary | ICD-10-CM | POA: Diagnosis not present

## 2021-11-14 DIAGNOSIS — F332 Major depressive disorder, recurrent severe without psychotic features: Secondary | ICD-10-CM | POA: Diagnosis not present

## 2021-11-14 DIAGNOSIS — F603 Borderline personality disorder: Secondary | ICD-10-CM | POA: Diagnosis not present

## 2021-11-14 DIAGNOSIS — F4312 Post-traumatic stress disorder, chronic: Secondary | ICD-10-CM | POA: Diagnosis not present

## 2021-11-14 DIAGNOSIS — F411 Generalized anxiety disorder: Secondary | ICD-10-CM | POA: Diagnosis not present

## 2021-11-19 DIAGNOSIS — J019 Acute sinusitis, unspecified: Secondary | ICD-10-CM | POA: Diagnosis not present

## 2021-11-19 DIAGNOSIS — F332 Major depressive disorder, recurrent severe without psychotic features: Secondary | ICD-10-CM | POA: Diagnosis not present

## 2021-11-19 DIAGNOSIS — F4312 Post-traumatic stress disorder, chronic: Secondary | ICD-10-CM | POA: Diagnosis not present

## 2021-11-19 DIAGNOSIS — F603 Borderline personality disorder: Secondary | ICD-10-CM | POA: Diagnosis not present

## 2021-11-19 DIAGNOSIS — F411 Generalized anxiety disorder: Secondary | ICD-10-CM | POA: Diagnosis not present

## 2021-11-29 DIAGNOSIS — I10 Essential (primary) hypertension: Secondary | ICD-10-CM | POA: Diagnosis not present

## 2021-11-29 DIAGNOSIS — J019 Acute sinusitis, unspecified: Secondary | ICD-10-CM | POA: Diagnosis not present

## 2021-12-02 DIAGNOSIS — F332 Major depressive disorder, recurrent severe without psychotic features: Secondary | ICD-10-CM | POA: Diagnosis not present

## 2021-12-02 DIAGNOSIS — F341 Dysthymic disorder: Secondary | ICD-10-CM | POA: Diagnosis not present

## 2021-12-02 DIAGNOSIS — F410 Panic disorder [episodic paroxysmal anxiety] without agoraphobia: Secondary | ICD-10-CM | POA: Diagnosis not present

## 2021-12-02 DIAGNOSIS — F900 Attention-deficit hyperactivity disorder, predominantly inattentive type: Secondary | ICD-10-CM | POA: Diagnosis not present

## 2021-12-03 DIAGNOSIS — F332 Major depressive disorder, recurrent severe without psychotic features: Secondary | ICD-10-CM | POA: Diagnosis not present

## 2021-12-03 DIAGNOSIS — F411 Generalized anxiety disorder: Secondary | ICD-10-CM | POA: Diagnosis not present

## 2021-12-03 DIAGNOSIS — F603 Borderline personality disorder: Secondary | ICD-10-CM | POA: Diagnosis not present

## 2021-12-03 DIAGNOSIS — F4312 Post-traumatic stress disorder, chronic: Secondary | ICD-10-CM | POA: Diagnosis not present

## 2021-12-15 IMAGING — MG MM DIGITAL SCREENING BILAT W/ TOMO AND CAD
8 series · 8 of 24 positions shown · non-contrast
Comparison: None.

CLINICAL DATA: Screening. Baseline examination.

EXAM:
DIGITAL SCREENING BILATERAL MAMMOGRAM WITH TOMOSYNTHESIS AND CAD
TECHNIQUE: Bilateral screening digital craniocaudal and mediolateral oblique
mammograms were obtained. Bilateral screening digital breast
tomosynthesis was performed. The images were evaluated with
computer-aided detection.

[L CC synth-2D]
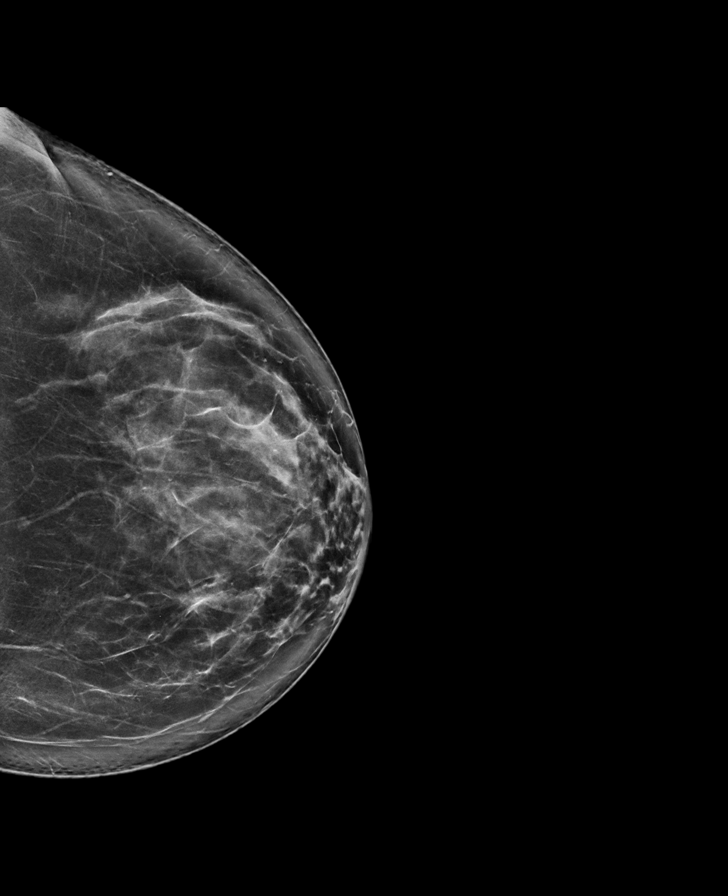

[R CC synth-2D]
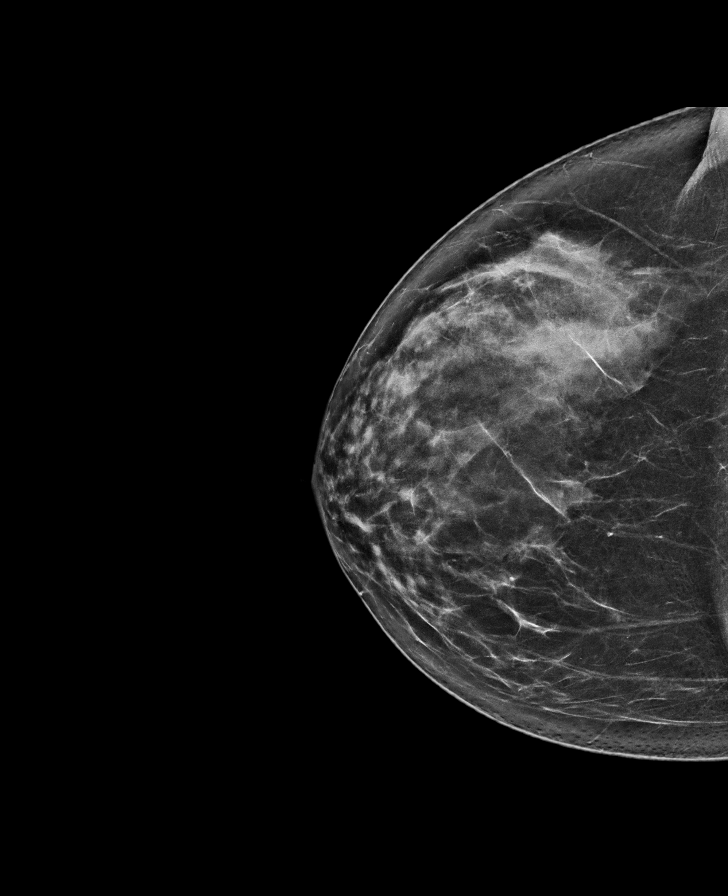

[R MLO synth-2D]
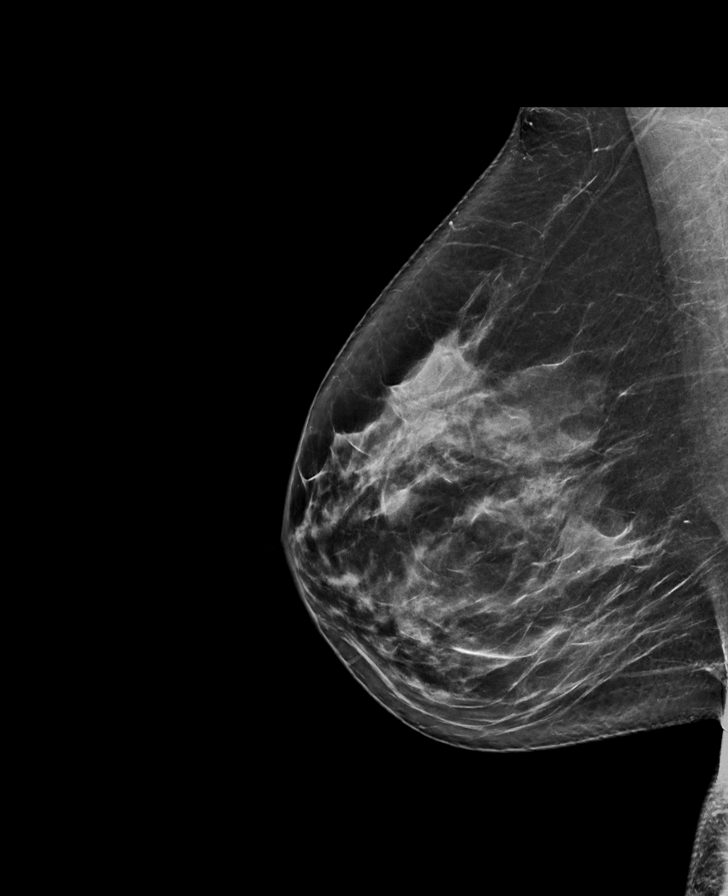

[L MLO synth-2D]
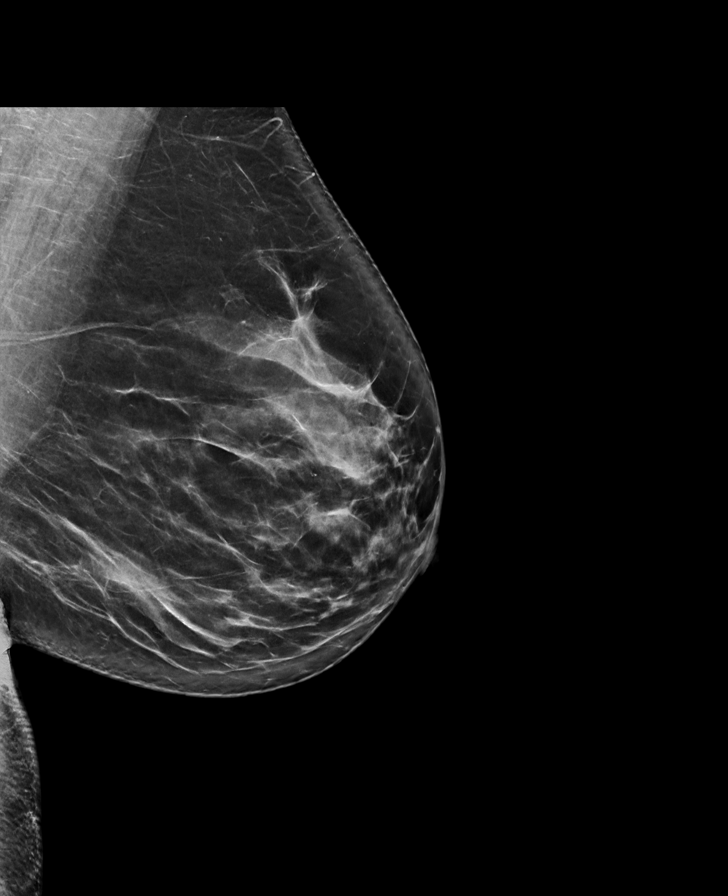

[L CC tomo · tomo slice 43/86.0]
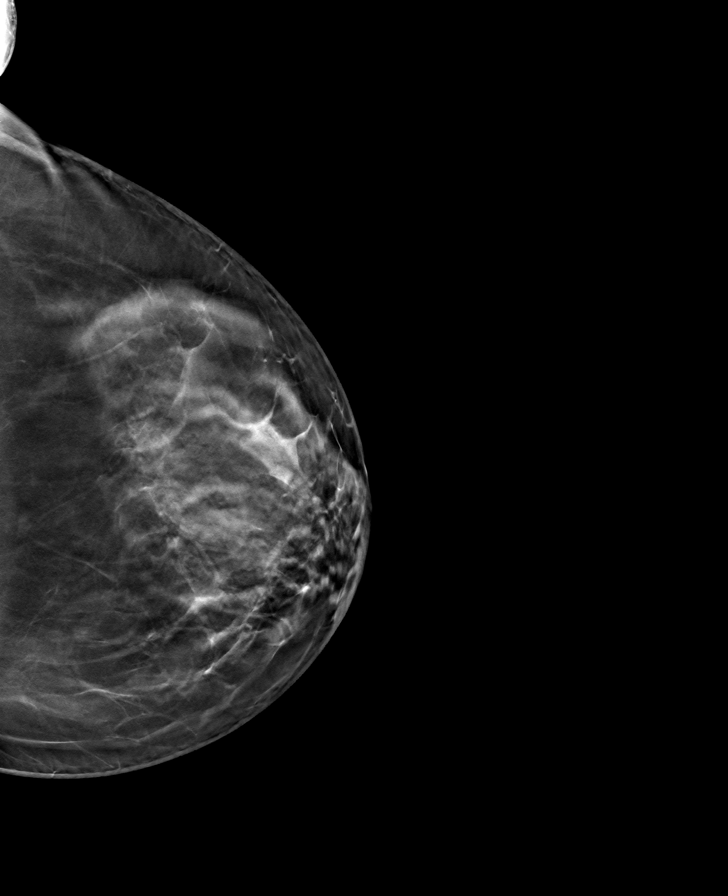

[L MLO tomo · tomo slice 43/85.0]
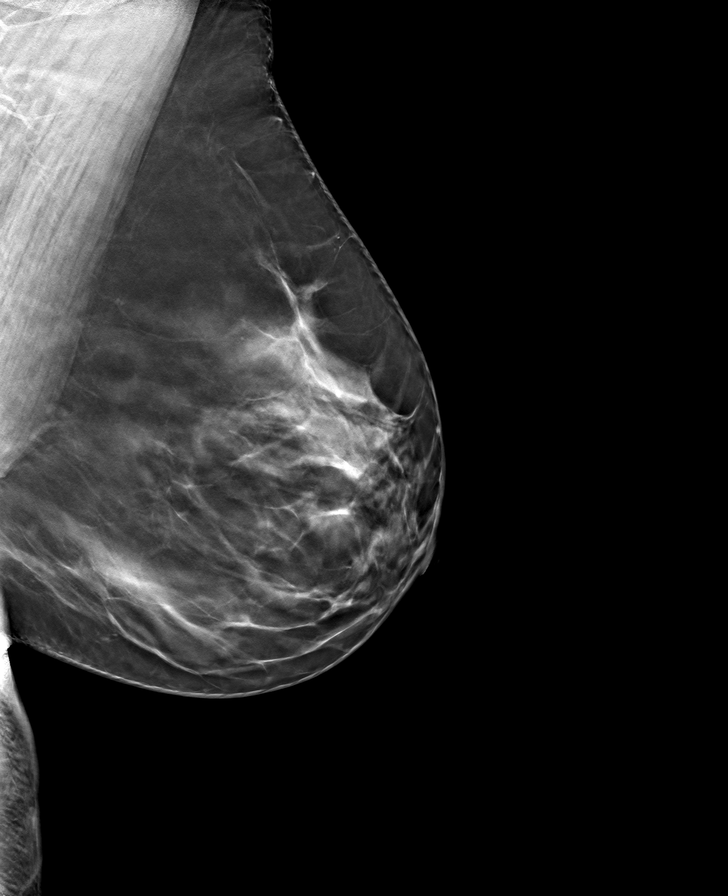

[R CC tomo · tomo slice 39/76.0]
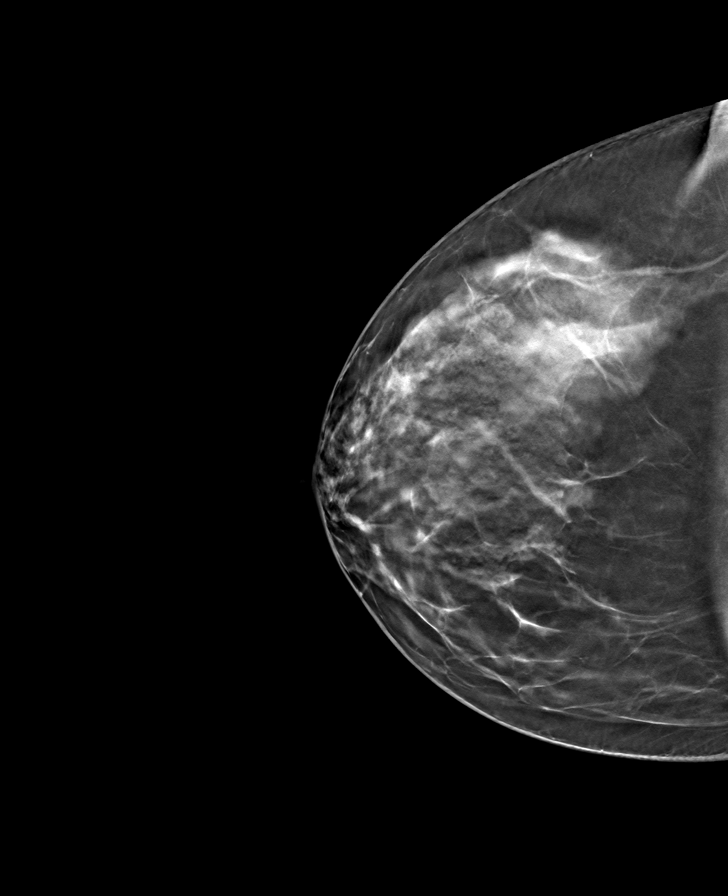

[R MLO tomo · tomo slice 41/82.0]
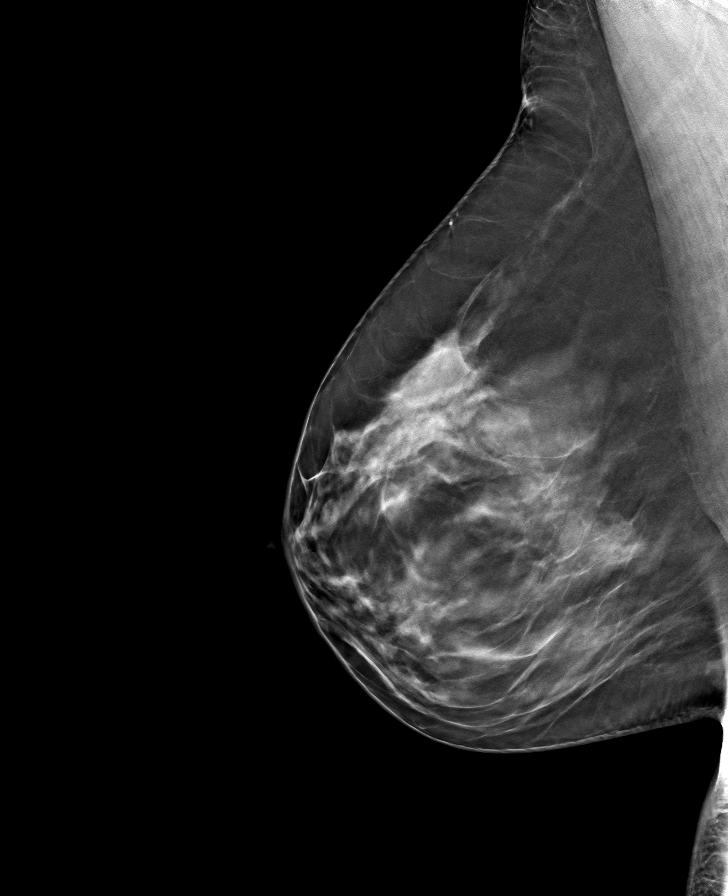

[8 of 24 positions shown; findings below may reference images not displayed]

ACR Breast Density Category c: The breast tissue is heterogeneously
dense, which may obscure small masses.
FINDINGS: In the right breast a possible OUTER RIGHT breast asymmetry requires
further evaluation.

In the left breast possible distortions requires further evaluation.
IMPRESSION: Further evaluation is suggested for possible asymmetry in the right
breast.

Further evaluation is suggested for possible distortions in the left
breast.

RECOMMENDATION:
Diagnostic mammogram and possibly ultrasound of both breasts.
(Code:[id number])

The patient will be contacted regarding the findings, and additional
imaging will be scheduled.

BI-RADS CATEGORY  0: Incomplete. Need additional imaging evaluation
and/or prior mammograms for comparison.

## 2021-12-17 DIAGNOSIS — F603 Borderline personality disorder: Secondary | ICD-10-CM | POA: Diagnosis not present

## 2021-12-17 DIAGNOSIS — F332 Major depressive disorder, recurrent severe without psychotic features: Secondary | ICD-10-CM | POA: Diagnosis not present

## 2021-12-17 DIAGNOSIS — F411 Generalized anxiety disorder: Secondary | ICD-10-CM | POA: Diagnosis not present

## 2021-12-17 DIAGNOSIS — F4312 Post-traumatic stress disorder, chronic: Secondary | ICD-10-CM | POA: Diagnosis not present

## 2021-12-24 DIAGNOSIS — F332 Major depressive disorder, recurrent severe without psychotic features: Secondary | ICD-10-CM | POA: Diagnosis not present

## 2021-12-24 DIAGNOSIS — F603 Borderline personality disorder: Secondary | ICD-10-CM | POA: Diagnosis not present

## 2021-12-24 DIAGNOSIS — F411 Generalized anxiety disorder: Secondary | ICD-10-CM | POA: Diagnosis not present

## 2021-12-24 DIAGNOSIS — F4312 Post-traumatic stress disorder, chronic: Secondary | ICD-10-CM | POA: Diagnosis not present

## 2022-01-07 DIAGNOSIS — F411 Generalized anxiety disorder: Secondary | ICD-10-CM | POA: Diagnosis not present

## 2022-01-07 DIAGNOSIS — F603 Borderline personality disorder: Secondary | ICD-10-CM | POA: Diagnosis not present

## 2022-01-07 DIAGNOSIS — F4312 Post-traumatic stress disorder, chronic: Secondary | ICD-10-CM | POA: Diagnosis not present

## 2022-01-07 DIAGNOSIS — F332 Major depressive disorder, recurrent severe without psychotic features: Secondary | ICD-10-CM | POA: Diagnosis not present

## 2022-01-09 DIAGNOSIS — F411 Generalized anxiety disorder: Secondary | ICD-10-CM | POA: Diagnosis not present

## 2022-01-09 DIAGNOSIS — F332 Major depressive disorder, recurrent severe without psychotic features: Secondary | ICD-10-CM | POA: Diagnosis not present

## 2022-01-09 DIAGNOSIS — F4312 Post-traumatic stress disorder, chronic: Secondary | ICD-10-CM | POA: Diagnosis not present

## 2022-01-09 DIAGNOSIS — F603 Borderline personality disorder: Secondary | ICD-10-CM | POA: Diagnosis not present

## 2022-01-21 DIAGNOSIS — F411 Generalized anxiety disorder: Secondary | ICD-10-CM | POA: Diagnosis not present

## 2022-01-21 DIAGNOSIS — F332 Major depressive disorder, recurrent severe without psychotic features: Secondary | ICD-10-CM | POA: Diagnosis not present

## 2022-01-21 DIAGNOSIS — F603 Borderline personality disorder: Secondary | ICD-10-CM | POA: Diagnosis not present

## 2022-01-21 DIAGNOSIS — F4312 Post-traumatic stress disorder, chronic: Secondary | ICD-10-CM | POA: Diagnosis not present

## 2022-02-04 DIAGNOSIS — F411 Generalized anxiety disorder: Secondary | ICD-10-CM | POA: Diagnosis not present

## 2022-02-04 DIAGNOSIS — F4312 Post-traumatic stress disorder, chronic: Secondary | ICD-10-CM | POA: Diagnosis not present

## 2022-02-04 DIAGNOSIS — F603 Borderline personality disorder: Secondary | ICD-10-CM | POA: Diagnosis not present

## 2022-02-04 DIAGNOSIS — F332 Major depressive disorder, recurrent severe without psychotic features: Secondary | ICD-10-CM | POA: Diagnosis not present

## 2022-02-10 ENCOUNTER — Ambulatory Visit: Payer: BC Managed Care – PPO | Admitting: Nurse Practitioner

## 2022-02-10 VITALS — BP 120/80 | HR 91

## 2022-02-10 DIAGNOSIS — N762 Acute vulvitis: Secondary | ICD-10-CM | POA: Diagnosis not present

## 2022-02-10 DIAGNOSIS — N3946 Mixed incontinence: Secondary | ICD-10-CM | POA: Diagnosis not present

## 2022-02-10 DIAGNOSIS — N3010 Interstitial cystitis (chronic) without hematuria: Secondary | ICD-10-CM | POA: Diagnosis not present

## 2022-02-10 MED ORDER — CLOBETASOL PROPIONATE 0.05 % EX OINT: 1.0000 | TOPICAL_OINTMENT | Freq: Two times a day (BID) | CUTANEOUS | 0 refills | Status: DC

## 2022-02-10 MED ORDER — OXYBUTYNIN CHLORIDE ER 5 MG PO TB24: 5.0000 mg | ORAL_TABLET | Freq: Every day | ORAL | 1 refills | Status: DC

## 2022-02-10 NOTE — Progress Notes (Signed)
   Acute Office Visit  Subjective:    Patient ID: Abigail Higgins, female    DOB: [birth year], 41 y.o.   MRN: [id number]   HPI 41 y.o. presents today for mixed urinary incontinence. Urge incontinence is the most bothersome. She tries to take frequent bathroom breaks at work but it is hard to stop what she is doing to go. Denies dysuria, frequency, or hematuria. She used to just have dribbling during incontinence episodes but now she has episodes of fully emptying bladder. Complains of itching in perineum. Has been using mycology ointment. Thought itching was from hemorrhoids.    Review of Systems  Constitutional: Negative.   Genitourinary:  Positive for vaginal pain (Itching of perineum). Negative for difficulty urinating, dysuria, flank pain, frequency, hematuria and urgency.       Incontinence       Objective:    Physical Exam Constitutional:      Appearance: Normal appearance.  Genitourinary:    General: Normal vulva.     Vagina: Normal.     Cervix: Normal.     Rectum: External hemorrhoid (Very small, non-bleeding, non-erythematous hemorrhoid) present.       BP 120/80   Pulse 91   SpO2 97%  Wt Readings from Last 3 Encounters:  08/16/21 174 lb (78.9 kg)  03/05/21 190 lb (86.2 kg)  12/18/20 206 lb (93.4 kg)        Patient informed chaperone available to be present for breast and/or pelvic exam. Patient has requested no chaperone to be present. Patient has been advised what will be completed during breast and pelvic exam.   UA negative  Assessment & Plan:   Problem List Items Addressed This Visit   None Visit Diagnoses     Mixed incontinence    -  Primary   Relevant Medications   oxybutynin (DITROPAN XL) 5 MG 24 hr tablet   Other Relevant Orders   Urinalysis,Complete w/RFL Culture   Acute vulvitis       Relevant Medications   clobetasol ointment (TEMOVATE) 0.05 %      Plan: UA unremarkable. Discussed differences of stress and urge incontinence, causes,  and management options for both. Will start Ditropan 5 mg daily. Also recommend pelvic floor PT. She is interested in PT if medications are not effective. Apply Clobetasol ointment to perineum BID x 7 days. Aware to use sparingly after this.      Olivia Mackie DNP, 4:27 PM 02/10/2022

## 2022-02-11 ENCOUNTER — Other Ambulatory Visit: Payer: Self-pay | Admitting: Nurse Practitioner

## 2022-02-11 ENCOUNTER — Telehealth: Payer: Self-pay | Admitting: *Deleted

## 2022-02-11 DIAGNOSIS — F603 Borderline personality disorder: Secondary | ICD-10-CM | POA: Diagnosis not present

## 2022-02-11 DIAGNOSIS — F4389 Other reactions to severe stress: Secondary | ICD-10-CM | POA: Diagnosis not present

## 2022-02-11 DIAGNOSIS — F411 Generalized anxiety disorder: Secondary | ICD-10-CM | POA: Diagnosis not present

## 2022-02-11 DIAGNOSIS — F4312 Post-traumatic stress disorder, chronic: Secondary | ICD-10-CM | POA: Diagnosis not present

## 2022-02-11 NOTE — Telephone Encounter (Signed)
Patient was prescribed Oxybutynin 5 mg at visit on 02/10/22. 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 She asked if another medication would be an options to help with mixed incontinence? If you think this is best medication she is willing to try medication again. Please advise

## 2022-02-11 NOTE — Telephone Encounter (Signed)
We could switch to a different class of medication but coverage can be difficult. It would be Myrbetriq.

## 2022-02-12 ENCOUNTER — Other Ambulatory Visit: Payer: Self-pay | Admitting: Nurse Practitioner

## 2022-02-12 DIAGNOSIS — N3281 Overactive bladder: Secondary | ICD-10-CM

## 2022-02-12 LAB — URINALYSIS, COMPLETE W/RFL CULTURE
Bilirubin Urine: NEGATIVE
Glucose, UA: NEGATIVE
Hgb urine dipstick: NEGATIVE
Hyaline Cast: NONE SEEN /LPF
Ketones, ur: NEGATIVE
Leukocyte Esterase: NEGATIVE
Nitrites, Initial: NEGATIVE
Protein, ur: NEGATIVE
Specific Gravity, Urine: 1.018 (ref 1.001–1.035)
pH: 5 (ref 5.0–8.0)

## 2022-02-12 LAB — CULTURE INDICATED

## 2022-02-12 LAB — URINE CULTURE
MICRO NUMBER:: 14233457
SPECIMEN QUALITY:: ADEQUATE

## 2022-02-12 MED ORDER — MIRABEGRON ER 25 MG PO TB24: 25.0000 mg | ORAL_TABLET | Freq: Every day | ORAL | 2 refills | Status: DC

## 2022-02-12 NOTE — Telephone Encounter (Signed)
Patient said she will try Myretriq send to [facility]. Please advise

## 2022-02-17 DIAGNOSIS — E785 Hyperlipidemia, unspecified: Secondary | ICD-10-CM | POA: Diagnosis not present

## 2022-02-17 DIAGNOSIS — Z130 Encounter for screening for diseases of the blood and blood-forming organs and certain disorders involving the immune mechanism: Secondary | ICD-10-CM | POA: Diagnosis not present

## 2022-02-17 DIAGNOSIS — Z79899 Other long term (current) drug therapy: Secondary | ICD-10-CM | POA: Diagnosis not present

## 2022-02-17 DIAGNOSIS — Z1329 Encounter for screening for other suspected endocrine disorder: Secondary | ICD-10-CM | POA: Diagnosis not present

## 2022-02-19 DIAGNOSIS — K921 Melena: Secondary | ICD-10-CM | POA: Diagnosis not present

## 2022-02-19 DIAGNOSIS — K648 Other hemorrhoids: Secondary | ICD-10-CM | POA: Diagnosis not present

## 2022-03-25 DIAGNOSIS — F410 Panic disorder [episodic paroxysmal anxiety] without agoraphobia: Secondary | ICD-10-CM | POA: Diagnosis not present

## 2022-03-25 DIAGNOSIS — F3342 Major depressive disorder, recurrent, in full remission: Secondary | ICD-10-CM | POA: Diagnosis not present

## 2022-03-25 DIAGNOSIS — F900 Attention-deficit hyperactivity disorder, predominantly inattentive type: Secondary | ICD-10-CM | POA: Diagnosis not present

## 2022-03-26 ENCOUNTER — Telehealth: Payer: Self-pay

## 2022-03-26 NOTE — Telephone Encounter (Signed)
Thank you for relaying the message. Glad she has responded to it so well.

## 2022-03-26 NOTE — Telephone Encounter (Signed)
Patient called in voice mail. Wanted to "extend a sincere and warm thank you to Marny Lowenstein, NP for prescribing the Mybetriq".  Helping so much and she states she is 90% better and has gone down a pad size. Said she is "ecstatic".  Just wanted Tiffany to know.

## 2022-04-01 DIAGNOSIS — F332 Major depressive disorder, recurrent severe without psychotic features: Secondary | ICD-10-CM | POA: Diagnosis not present

## 2022-04-01 DIAGNOSIS — F603 Borderline personality disorder: Secondary | ICD-10-CM | POA: Diagnosis not present

## 2022-04-01 DIAGNOSIS — F411 Generalized anxiety disorder: Secondary | ICD-10-CM | POA: Diagnosis not present

## 2022-04-01 DIAGNOSIS — F4312 Post-traumatic stress disorder, chronic: Secondary | ICD-10-CM | POA: Diagnosis not present

## 2022-04-15 DIAGNOSIS — F4312 Post-traumatic stress disorder, chronic: Secondary | ICD-10-CM | POA: Diagnosis not present

## 2022-04-15 DIAGNOSIS — F603 Borderline personality disorder: Secondary | ICD-10-CM | POA: Diagnosis not present

## 2022-04-15 DIAGNOSIS — F411 Generalized anxiety disorder: Secondary | ICD-10-CM | POA: Diagnosis not present

## 2022-04-15 DIAGNOSIS — F332 Major depressive disorder, recurrent severe without psychotic features: Secondary | ICD-10-CM | POA: Diagnosis not present

## 2022-04-29 DIAGNOSIS — F332 Major depressive disorder, recurrent severe without psychotic features: Secondary | ICD-10-CM | POA: Diagnosis not present

## 2022-04-29 DIAGNOSIS — F4312 Post-traumatic stress disorder, chronic: Secondary | ICD-10-CM | POA: Diagnosis not present

## 2022-04-29 DIAGNOSIS — F603 Borderline personality disorder: Secondary | ICD-10-CM | POA: Diagnosis not present

## 2022-04-29 DIAGNOSIS — F411 Generalized anxiety disorder: Secondary | ICD-10-CM | POA: Diagnosis not present

## 2022-04-30 DIAGNOSIS — F900 Attention-deficit hyperactivity disorder, predominantly inattentive type: Secondary | ICD-10-CM | POA: Diagnosis not present

## 2022-04-30 DIAGNOSIS — F410 Panic disorder [episodic paroxysmal anxiety] without agoraphobia: Secondary | ICD-10-CM | POA: Diagnosis not present

## 2022-04-30 DIAGNOSIS — F3342 Major depressive disorder, recurrent, in full remission: Secondary | ICD-10-CM | POA: Diagnosis not present

## 2022-05-06 DIAGNOSIS — F332 Major depressive disorder, recurrent severe without psychotic features: Secondary | ICD-10-CM | POA: Diagnosis not present

## 2022-05-06 DIAGNOSIS — F603 Borderline personality disorder: Secondary | ICD-10-CM | POA: Diagnosis not present

## 2022-05-06 DIAGNOSIS — F4312 Post-traumatic stress disorder, chronic: Secondary | ICD-10-CM | POA: Diagnosis not present

## 2022-05-06 DIAGNOSIS — F411 Generalized anxiety disorder: Secondary | ICD-10-CM | POA: Diagnosis not present

## 2022-05-13 DIAGNOSIS — F603 Borderline personality disorder: Secondary | ICD-10-CM | POA: Diagnosis not present

## 2022-05-13 DIAGNOSIS — F411 Generalized anxiety disorder: Secondary | ICD-10-CM | POA: Diagnosis not present

## 2022-05-13 DIAGNOSIS — F332 Major depressive disorder, recurrent severe without psychotic features: Secondary | ICD-10-CM | POA: Diagnosis not present

## 2022-05-13 DIAGNOSIS — F4312 Post-traumatic stress disorder, chronic: Secondary | ICD-10-CM | POA: Diagnosis not present

## 2022-05-27 DIAGNOSIS — F603 Borderline personality disorder: Secondary | ICD-10-CM | POA: Diagnosis not present

## 2022-05-27 DIAGNOSIS — F332 Major depressive disorder, recurrent severe without psychotic features: Secondary | ICD-10-CM | POA: Diagnosis not present

## 2022-05-27 DIAGNOSIS — F4389 Other reactions to severe stress: Secondary | ICD-10-CM | POA: Diagnosis not present

## 2022-05-27 DIAGNOSIS — F4312 Post-traumatic stress disorder, chronic: Secondary | ICD-10-CM | POA: Diagnosis not present

## 2022-06-09 DIAGNOSIS — F410 Panic disorder [episodic paroxysmal anxiety] without agoraphobia: Secondary | ICD-10-CM | POA: Diagnosis not present

## 2022-06-09 DIAGNOSIS — F332 Major depressive disorder, recurrent severe without psychotic features: Secondary | ICD-10-CM | POA: Diagnosis not present

## 2022-06-09 DIAGNOSIS — F900 Attention-deficit hyperactivity disorder, predominantly inattentive type: Secondary | ICD-10-CM | POA: Diagnosis not present

## 2022-06-09 DIAGNOSIS — F3342 Major depressive disorder, recurrent, in full remission: Secondary | ICD-10-CM | POA: Diagnosis not present

## 2022-06-09 DIAGNOSIS — F5101 Primary insomnia: Secondary | ICD-10-CM | POA: Diagnosis not present

## 2022-06-15 ENCOUNTER — Other Ambulatory Visit: Payer: Self-pay | Admitting: Nurse Practitioner

## 2022-06-15 DIAGNOSIS — N3281 Overactive bladder: Secondary | ICD-10-CM

## 2022-06-16 NOTE — Telephone Encounter (Signed)
Med refill request: Myrbetriq Last appt: 02/10/22 Next AEX: not scheduled Last MMG (if hormonal med) Refill authorized: Please Advise?

## 2022-06-17 DIAGNOSIS — F603 Borderline personality disorder: Secondary | ICD-10-CM | POA: Diagnosis not present

## 2022-06-17 DIAGNOSIS — F4312 Post-traumatic stress disorder, chronic: Secondary | ICD-10-CM | POA: Diagnosis not present

## 2022-06-17 DIAGNOSIS — F332 Major depressive disorder, recurrent severe without psychotic features: Secondary | ICD-10-CM | POA: Diagnosis not present

## 2022-06-17 DIAGNOSIS — F4389 Other reactions to severe stress: Secondary | ICD-10-CM | POA: Diagnosis not present

## 2022-07-08 DIAGNOSIS — Z23 Encounter for immunization: Secondary | ICD-10-CM | POA: Diagnosis not present

## 2022-07-08 DIAGNOSIS — L309 Dermatitis, unspecified: Secondary | ICD-10-CM | POA: Diagnosis not present

## 2022-07-08 DIAGNOSIS — R635 Abnormal weight gain: Secondary | ICD-10-CM | POA: Diagnosis not present

## 2022-07-21 ENCOUNTER — Encounter (HOSPITAL_COMMUNITY): Payer: Self-pay

## 2022-07-28 DIAGNOSIS — F330 Major depressive disorder, recurrent, mild: Secondary | ICD-10-CM | POA: Diagnosis not present

## 2022-07-28 DIAGNOSIS — F603 Borderline personality disorder: Secondary | ICD-10-CM | POA: Diagnosis not present

## 2022-07-28 DIAGNOSIS — F411 Generalized anxiety disorder: Secondary | ICD-10-CM | POA: Diagnosis not present

## 2022-07-30 DIAGNOSIS — R300 Dysuria: Secondary | ICD-10-CM | POA: Diagnosis not present

## 2022-07-30 DIAGNOSIS — R61 Generalized hyperhidrosis: Secondary | ICD-10-CM | POA: Diagnosis not present

## 2022-08-04 DIAGNOSIS — F411 Generalized anxiety disorder: Secondary | ICD-10-CM | POA: Diagnosis not present

## 2022-08-04 DIAGNOSIS — F330 Major depressive disorder, recurrent, mild: Secondary | ICD-10-CM | POA: Diagnosis not present

## 2022-08-04 DIAGNOSIS — F603 Borderline personality disorder: Secondary | ICD-10-CM | POA: Diagnosis not present

## 2022-08-14 DIAGNOSIS — R61 Generalized hyperhidrosis: Secondary | ICD-10-CM | POA: Diagnosis not present

## 2022-08-14 DIAGNOSIS — L301 Dyshidrosis [pompholyx]: Secondary | ICD-10-CM | POA: Diagnosis not present

## 2022-08-14 DIAGNOSIS — D2239 Melanocytic nevi of other parts of face: Secondary | ICD-10-CM | POA: Diagnosis not present

## 2022-08-19 ENCOUNTER — Ambulatory Visit: Payer: BC Managed Care – PPO | Admitting: Nurse Practitioner

## 2022-08-19 ENCOUNTER — Encounter: Payer: Self-pay | Admitting: Nurse Practitioner

## 2022-08-19 VITALS — BP 122/74 | HR 92 | Temp 98.2°F | Ht 65.0 in | Wt 161.0 lb

## 2022-08-19 DIAGNOSIS — N39498 Other specified urinary incontinence: Secondary | ICD-10-CM | POA: Diagnosis not present

## 2022-08-19 DIAGNOSIS — R42 Dizziness and giddiness: Secondary | ICD-10-CM | POA: Diagnosis not present

## 2022-08-19 DIAGNOSIS — R32 Unspecified urinary incontinence: Secondary | ICD-10-CM

## 2022-08-19 DIAGNOSIS — R232 Flushing: Secondary | ICD-10-CM

## 2022-08-19 DIAGNOSIS — R5383 Other fatigue: Secondary | ICD-10-CM

## 2022-08-19 NOTE — Progress Notes (Signed)
   Acute Office Visit  Subjective:    Patient ID: Abigail Higgins, female    DOB: [birth year], 42 y.o.   MRN: [id number]   HPI 42 y.o. G0 presents today with complaints of hot flashes, dizziness, and extreme fatigue for 2-3 weeks. Treated for possible UTI by virtual visit 07/31/2022 elsewhere with macrobid. Symptoms did not improve. Taking Mybertriq for OAB. Mirena IUD in place, amenorrheic. Weaned herself off Abilify a few weeks ago due to weight gain. Has been taking for years. Reports doing ok emotionally without it.   No LMP recorded. (Menstrual status: IUD).    Review of Systems  Constitutional:  Positive for fatigue.  Endocrine: Positive for heat intolerance.  Genitourinary:  Negative for dysuria, flank pain, frequency, hematuria, pelvic pain, urgency, vaginal bleeding and vaginal discharge.       Urinary incontinence (not new)  Neurological:  Positive for dizziness.       Objective:    Physical Exam Constitutional:      Appearance: Normal appearance.   GU: Not indicated  BP 122/74   Pulse 92   Temp 98.2 F (36.8 C)   Ht 5\' 5"  (1.651 m)   Wt 161 lb (73 kg)   SpO2 99%   BMI 26.79 kg/m  Wt Readings from Last 3 Encounters:  08/19/22 161 lb (73 kg)  08/16/21 174 lb (78.9 kg)  03/05/21 190 lb (86.2 kg)        Patient informed chaperone available to be present for breast and/or pelvic exam. Patient has requested no chaperone to be present. Patient has been advised what will be completed during breast and pelvic exam.   UA: trace leukocytes, negative nitrites, trace protein, negative blood, dark yellow/light cloudy. Microscopic: wbc 0-5, rbc none, few bacteria  Assessment & Plan:   Problem List Items Addressed This Visit   None Visit Diagnoses     Fatigue, unspecified type    -  Primary   Hot flashes       Urinary incontinence, unspecified type       Relevant Orders   Urinalysis,Complete w/RFL Culture (Completed)   Dizziness          Plan: Symptoms  likely s/t discontinuation of Abilify. Will monitor. Discussed withdrawal symptoms. If still having symptoms after 2-3 weeks, labs recommended and she is agreeable. UA unremarkable.      Olivia Mackie DNP, 9:55 AM 08/19/2022

## 2022-08-20 DIAGNOSIS — F330 Major depressive disorder, recurrent, mild: Secondary | ICD-10-CM | POA: Diagnosis not present

## 2022-08-20 DIAGNOSIS — F411 Generalized anxiety disorder: Secondary | ICD-10-CM | POA: Diagnosis not present

## 2022-08-20 DIAGNOSIS — F4312 Post-traumatic stress disorder, chronic: Secondary | ICD-10-CM | POA: Diagnosis not present

## 2022-08-20 DIAGNOSIS — F603 Borderline personality disorder: Secondary | ICD-10-CM | POA: Diagnosis not present

## 2022-08-21 LAB — URINE CULTURE
MICRO NUMBER:: 15038395
Result:: NO GROWTH
SPECIMEN QUALITY:: ADEQUATE

## 2022-08-21 LAB — URINALYSIS, COMPLETE W/RFL CULTURE
Bilirubin Urine: NEGATIVE
Glucose, UA: NEGATIVE
Hgb urine dipstick: NEGATIVE
Hyaline Cast: NONE SEEN /LPF
Ketones, ur: NEGATIVE
Nitrites, Initial: NEGATIVE
RBC / HPF: NONE SEEN /HPF (ref 0–2)
Specific Gravity, Urine: 1.02 (ref 1.001–1.035)
pH: 6 (ref 5.0–8.0)

## 2022-08-21 LAB — CULTURE INDICATED

## 2022-08-26 DIAGNOSIS — Z1329 Encounter for screening for other suspected endocrine disorder: Secondary | ICD-10-CM | POA: Diagnosis not present

## 2022-08-26 DIAGNOSIS — Z130 Encounter for screening for diseases of the blood and blood-forming organs and certain disorders involving the immune mechanism: Secondary | ICD-10-CM | POA: Diagnosis not present

## 2022-09-11 ENCOUNTER — Other Ambulatory Visit: Payer: Self-pay | Admitting: Nurse Practitioner

## 2022-09-11 DIAGNOSIS — N3281 Overactive bladder: Secondary | ICD-10-CM

## 2022-09-11 NOTE — Telephone Encounter (Signed)
Med refill request: Myrbetriq Last OV: 08/19/22 Next AEX: not scheduled Last MMG (if hormonal med) 11/28/20 Refill authorized: Please Advise, #[phone], 0 RF

## 2022-09-24 IMAGING — DX DG CHEST 1V PORT
1 series · 1 of 1 positions shown · non-contrast
Comparison: No priors.

CLINICAL DATA: 40-year-old female with history of chest pain and
shortness of breath since 5 a.m.

EXAM:
PORTABLE CHEST 1 VIEW

[chest ap]
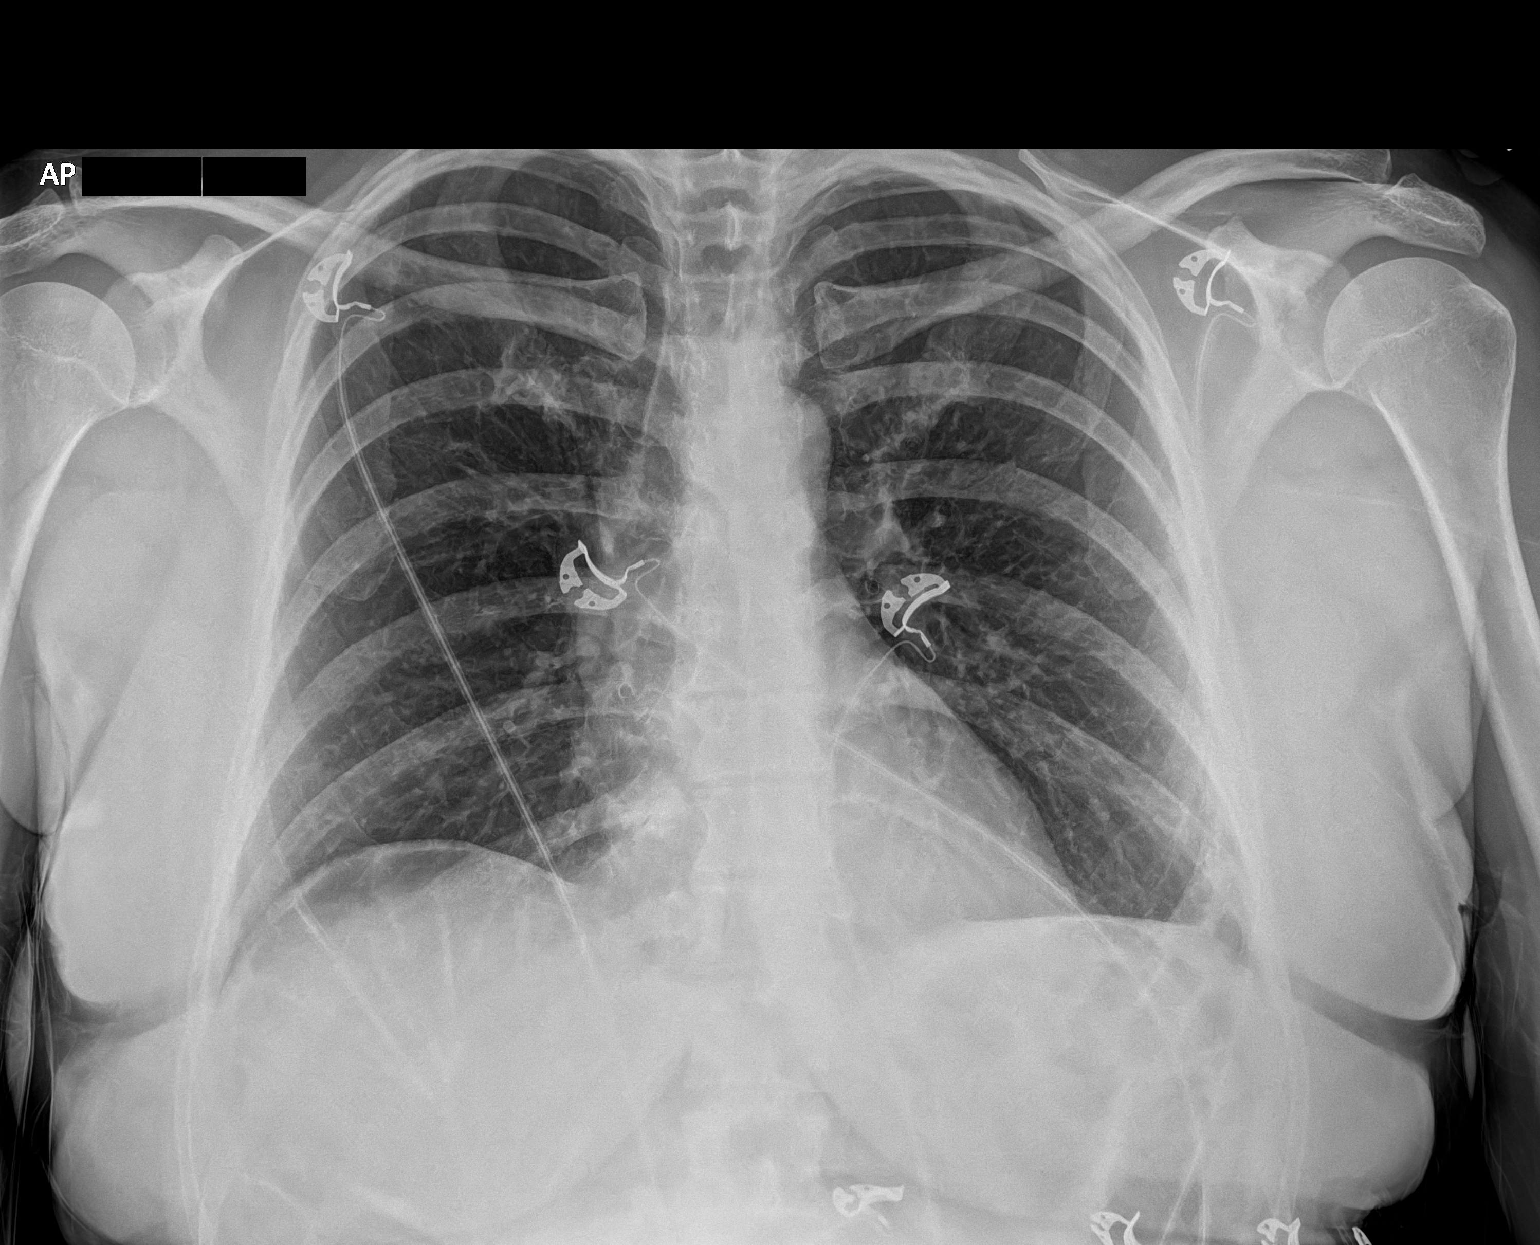

[1 of 1 positions shown; findings below may reference images not displayed]

FINDINGS: Lung volumes are normal. No consolidative airspace disease. No
pleural effusions. No pneumothorax. No pulmonary nodule or mass
noted. Pulmonary vasculature and the cardiomediastinal silhouette
are within normal limits.
IMPRESSION: No radiographic evidence of acute cardiopulmonary disease.

## 2022-10-01 DIAGNOSIS — F332 Major depressive disorder, recurrent severe without psychotic features: Secondary | ICD-10-CM | POA: Diagnosis not present

## 2022-10-01 DIAGNOSIS — F410 Panic disorder [episodic paroxysmal anxiety] without agoraphobia: Secondary | ICD-10-CM | POA: Diagnosis not present

## 2022-10-01 DIAGNOSIS — F900 Attention-deficit hyperactivity disorder, predominantly inattentive type: Secondary | ICD-10-CM | POA: Diagnosis not present

## 2022-10-13 DIAGNOSIS — F410 Panic disorder [episodic paroxysmal anxiety] without agoraphobia: Secondary | ICD-10-CM | POA: Diagnosis not present

## 2022-10-13 DIAGNOSIS — F900 Attention-deficit hyperactivity disorder, predominantly inattentive type: Secondary | ICD-10-CM | POA: Diagnosis not present

## 2022-10-13 DIAGNOSIS — F331 Major depressive disorder, recurrent, moderate: Secondary | ICD-10-CM | POA: Diagnosis not present

## 2022-10-19 DIAGNOSIS — F603 Borderline personality disorder: Secondary | ICD-10-CM | POA: Diagnosis not present

## 2022-10-26 ENCOUNTER — Other Ambulatory Visit: Payer: Self-pay | Admitting: Obstetrics & Gynecology

## 2022-10-26 DIAGNOSIS — F603 Borderline personality disorder: Secondary | ICD-10-CM | POA: Diagnosis not present

## 2022-10-26 DIAGNOSIS — N3281 Overactive bladder: Secondary | ICD-10-CM

## 2022-10-28 NOTE — Telephone Encounter (Signed)
Med refill request:Myrbetriq 25 mg tab24, po daily Last AEX: 03/05/21 -TW Next AEX: Not scheduled Last MMG (if hormonal med) N/A  Spoke with patient. Patient states she is unsure if she is still taking medication, will review with her husband. Advised updated AEX needed for refills. Patient will review and return call.   Rx refused.   Routing to provider for final review. Patient is agreeable to disposition. Will close encounter.

## 2022-10-29 ENCOUNTER — Inpatient Hospital Stay (HOSPITAL_BASED_OUTPATIENT_CLINIC_OR_DEPARTMENT_OTHER)
Admission: EM | Admit: 2022-10-29 | Discharge: 2022-10-31 | DRG: 897 | Disposition: A | Discharge status: Home / Self Care | Payer: BC Managed Care – PPO | Attending: Internal Medicine | Admitting: Internal Medicine

## 2022-10-29 ENCOUNTER — Other Ambulatory Visit: Payer: Self-pay

## 2022-10-29 ENCOUNTER — Encounter (HOSPITAL_BASED_OUTPATIENT_CLINIC_OR_DEPARTMENT_OTHER): Payer: Self-pay | Admitting: Emergency Medicine

## 2022-10-29 DIAGNOSIS — Z5986 Financial insecurity: Secondary | ICD-10-CM

## 2022-10-29 DIAGNOSIS — F5104 Psychophysiologic insomnia: Secondary | ICD-10-CM | POA: Diagnosis not present

## 2022-10-29 DIAGNOSIS — R443 Hallucinations, unspecified: Secondary | ICD-10-CM | POA: Diagnosis present

## 2022-10-29 DIAGNOSIS — F410 Panic disorder [episodic paroxysmal anxiety] without agoraphobia: Secondary | ICD-10-CM | POA: Diagnosis present

## 2022-10-29 DIAGNOSIS — Z87410 Personal history of cervical dysplasia: Secondary | ICD-10-CM

## 2022-10-29 DIAGNOSIS — K219 Gastro-esophageal reflux disease without esophagitis: Secondary | ICD-10-CM | POA: Diagnosis not present

## 2022-10-29 DIAGNOSIS — Z1152 Encounter for screening for COVID-19: Secondary | ICD-10-CM

## 2022-10-29 DIAGNOSIS — F419 Anxiety disorder, unspecified: Secondary | ICD-10-CM | POA: Diagnosis not present

## 2022-10-29 DIAGNOSIS — K5909 Other constipation: Secondary | ICD-10-CM | POA: Diagnosis not present

## 2022-10-29 DIAGNOSIS — F1210 Cannabis abuse, uncomplicated: Secondary | ICD-10-CM | POA: Diagnosis not present

## 2022-10-29 DIAGNOSIS — R45851 Suicidal ideations: Secondary | ICD-10-CM | POA: Diagnosis present

## 2022-10-29 DIAGNOSIS — Z801 Family history of malignant neoplasm of trachea, bronchus and lung: Secondary | ICD-10-CM | POA: Diagnosis not present

## 2022-10-29 DIAGNOSIS — F909 Attention-deficit hyperactivity disorder, unspecified type: Secondary | ICD-10-CM | POA: Diagnosis not present

## 2022-10-29 DIAGNOSIS — F603 Borderline personality disorder: Secondary | ICD-10-CM | POA: Diagnosis present

## 2022-10-29 DIAGNOSIS — F13932 Sedative, hypnotic or anxiolytic use, unspecified with withdrawal with perceptual disturbances: Principal | ICD-10-CM

## 2022-10-29 DIAGNOSIS — F13939 Sedative, hypnotic or anxiolytic use, unspecified with withdrawal, unspecified: Secondary | ICD-10-CM | POA: Diagnosis present

## 2022-10-29 DIAGNOSIS — F13232 Sedative, hypnotic or anxiolytic dependence with withdrawal with perceptual disturbance: Secondary | ICD-10-CM | POA: Diagnosis not present

## 2022-10-29 DIAGNOSIS — F13931 Sedative, hypnotic or anxiolytic use, unspecified with withdrawal delirium: Secondary | ICD-10-CM | POA: Diagnosis not present

## 2022-10-29 DIAGNOSIS — F32A Depression, unspecified: Secondary | ICD-10-CM | POA: Diagnosis present

## 2022-10-29 DIAGNOSIS — I10 Essential (primary) hypertension: Secondary | ICD-10-CM | POA: Diagnosis not present

## 2022-10-29 DIAGNOSIS — Z975 Presence of (intrauterine) contraceptive device: Secondary | ICD-10-CM | POA: Diagnosis not present

## 2022-10-29 DIAGNOSIS — E785 Hyperlipidemia, unspecified: Secondary | ICD-10-CM | POA: Diagnosis not present

## 2022-10-29 DIAGNOSIS — Z79899 Other long term (current) drug therapy: Secondary | ICD-10-CM

## 2022-10-29 DIAGNOSIS — F13239 Sedative, hypnotic or anxiolytic dependence with withdrawal, unspecified: Secondary | ICD-10-CM | POA: Diagnosis not present

## 2022-10-29 LAB — COMPREHENSIVE METABOLIC PANEL
ALT: 11 U/L (ref 0–44)
AST: 13 U/L — ABNORMAL LOW (ref 15–41)
Albumin: 4.6 g/dL (ref 3.5–5.0)
Alkaline Phosphatase: 48 U/L (ref 38–126)
Anion gap: 12 (ref 5–15)
BUN: 13 mg/dL (ref 6–20)
CO2: 20 mmol/L — ABNORMAL LOW (ref 22–32)
Calcium: 10 mg/dL (ref 8.9–10.3)
Chloride: 107 mmol/L (ref 98–111)
Creatinine, Ser: 0.69 mg/dL (ref 0.44–1.00)
GFR, Estimated: >60 mL/min (ref >60)
Glucose, Bld: 94 mg/dL (ref 70–99)
Potassium: 3.6 mmol/L (ref 3.5–5.1)
Sodium: 139 mmol/L (ref 135–145)
Total Bilirubin: 1.8 mg/dL — ABNORMAL HIGH (ref 0.3–1.2)
Total Protein: 7.5 g/dL (ref 6.5–8.1)

## 2022-10-29 LAB — CBC
HCT: 41.1 % (ref 36.0–46.0)
Hemoglobin: 14.1 g/dL (ref 12.0–15.0)
MCH: 31.2 pg (ref 26.0–34.0)
MCHC: 34.3 g/dL (ref 30.0–36.0)
MCV: 90.9 fL (ref 80.0–100.0)
Platelets: 280 10*3/uL (ref 150–400)
RBC: 4.52 MIL/uL (ref 3.87–5.11)
RDW: 13.2 % (ref 11.5–15.5)
WBC: 10 10*3/uL (ref 4.0–10.5)
nRBC: 0 % (ref 0.0–0.2)

## 2022-10-29 LAB — SALICYLATE LEVEL: Salicylate Lvl: <7 mg/dL — ABNORMAL LOW (ref 7.0–30.0)

## 2022-10-29 LAB — ACETAMINOPHEN LEVEL: Acetaminophen (Tylenol), Serum: <10 ug/mL — ABNORMAL LOW (ref 10–30)

## 2022-10-29 LAB — LITHIUM LEVEL: Lithium Lvl: 0.34 mmol/L — ABNORMAL LOW (ref 0.60–1.20)

## 2022-10-29 LAB — PREGNANCY, URINE: Preg Test, Ur: NEGATIVE

## 2022-10-29 LAB — ETHANOL: Alcohol, Ethyl (B): <10 mg/dL (ref <10)

## 2022-10-29 MED ORDER — SODIUM CHLORIDE 0.9 % IV BOLUS
1000.0000 mL | Freq: Once | INTRAVENOUS | Status: AC
  Administered 2022-10-29: 1000 mL via INTRAVENOUS

## 2022-10-29 MED ORDER — LORAZEPAM 2 MG/ML IJ SOLN
1.0000 mg | Freq: Once | INTRAMUSCULAR | Status: AC
  Administered 2022-10-29: 1 mg via INTRAVENOUS
  Filled 2022-10-29: qty 1

## 2022-10-29 MED ORDER — LORAZEPAM 2 MG/ML IJ SOLN
2.0000 mg | Freq: Once | INTRAMUSCULAR | Status: AC
  Administered 2022-10-29: 2 mg via INTRAVENOUS
  Filled 2022-10-29: qty 1

## 2022-10-29 MED ORDER — ALPRAZOLAM 0.5 MG PO TABS: 1.0000 mg | ORAL_TABLET | Freq: Once | ORAL | Status: DC

## 2022-10-29 NOTE — ED Triage Notes (Addendum)
Pt presents for medication refill, has not had xanax consistently since Friday, found one tab in purse at 2pm today but did not help significantly. Rx cannot be refilled until 16th. Pt is crying in triage, expresses extreme anxiety.  Pt endorses auditory hallucinations (thinking she could hear people upstairs), visual hallucinations (does not wish to speak about this). Endorses SI. Denies HI

## 2022-10-29 NOTE — Progress Notes (Addendum)
Plan of Care Note for accepted transfer  Patient: Abigail Higgins    UJW:[id number]  DOA: 10/29/2022     Facility requesting transfer: [facility] Requesting Provider: Arby Barrette. MD Reason for transfer: Waxing and waning confusion and passive suicidal ideation in the setting of benzodiazepine withdrawal Facility course:  42 year old female with significant medical history of chronic dermatitis, morbid obesity and generalized anxiety disorder (with history of previous suicidal ideation and thought) presented to emergency department accompanied by her husband with complaining of worsening generalized anxiety, difficulty sleeping and tremor.  Patient takes Xanax 1 mg 4 times daily but for last 2 days she has been ran out of Xanax.  A presentation to ED patient was hemodynamically stable.  Patient has active suicidal ideation and made IVC by ED provider in the emergency department.  Vitals at presentation found tachycardic 111, respiratory rate 25, blood pressure 127/116 and O2 sat 99% room air. Pending UDS.  Blood alcohol level less than 10. CBC grossly unremarkable except low bicarb 20.  Hospitalist team has been consulted to admit the patient for further evaluation of underlying psychiatric condition and monitor for which he does admit withdrawal.  Plan of care: Admitting patient for monitor for benzodiazepine withdrawal.  Patient need formal psychiatry evaluation and psychiatry need to decide about is it appropriate to take off the IVC on not.  The patient is accepted for admission to Telemetry unit, at [facility].    Check [email or web address] for on-call coverage.  [facility] will assume care on arrival to accepting facility. Until arrival, medical decision making responsibilities remain with the EDP.  However, [facility] available 24/7 for questions and assistance.   Nursing staff please page [facility] Admits and Consults [phone]) as soon as the patient arrives to the hospital.     Author: Tereasa Coop, MD  10/29/2022  [facility]

## 2022-10-29 NOTE — ED Provider Notes (Signed)
[place] EMERGENCY DEPARTMENT AT [facility] Provider Note   CSN: [id number] Arrival date & time: 10/29/22  1802     History {Add pertinent medical, surgical, social history, OB history to HPI:1} No chief complaint on file.   Abigail Higgins is a 42 y.o. female.  HPI Patient reports a long history of mental health problems.  She is here with her husband who is also historian.  Over the last week she has had worsening anxiety and difficulty sleeping.  She was taking some extra dose of her regular prescribed Xanax and has run out 2 days early.  Symptoms have escalated with tremors and crying and sleeplessness.  Patient reports she is experiencing some hallucinations.  She reports this has happened in the past and usually it is just figures that do not interact with her.  She reports over the past couple days this has been more intense with seeing things in her peripheral vision and colors as well.  Patient has vomited several times in the past couple days.  She reports taking her temperature and feeling hot but temperature has not measured greater than 98 degrees.  Patient has had suicidal ideations.  She reports she has had them since her childhood.  She reports that has been somewhat more frequent of a thought over the past few days but without any specific plan.  Patient reports that she had promised her father that she would never kill herself and does not intend to follow through on this.  Patient denies alcohol use.  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  Patient reports that she does use THC product which seems to help her symptoms typically.    Home Medications Prior to Admission medications   Medication Sig Start Date End Date Taking? Authorizing Provider  ALPRAZolam Prudy Feeler) 1 MG tablet Take 1 mg by mouth 4 (four) times daily as needed.    [provider]  amLODipine (NORVASC) 5 MG tablet Take 5 mg by mouth daily. 12/19/20   [provider]   azelastine (ASTELIN) 0.1 % nasal spray two sprays by Both Nostrils route 2 (two) times daily. 11/13/21   [provider]  Biotin 57846 MCG TABS Take by mouth.    [provider]  busPIRone (BUSPAR) 30 MG tablet Take by mouth.    [provider]  clobetasol ointment (TEMOVATE) 0.05 % Apply 1 Application topically 2 (two) times daily. 02/10/22   Olivia Mackie, NP  levonorgestrel (MIRENA) 20 MCG/24HR IUD 1 each by Intrauterine route once. Reported on 05/29/2015    [provider]  LINZESS 290 MCG CAPS capsule Take 290 mcg by mouth daily. 01/12/21   [provider]  lithium carbonate (ESKALITH) 450 MG CR tablet Take 450 mg by mouth at bedtime. 02/11/21   [provider]  MYRBETRIQ 25 MG TB24 tablet Take 1 tablet by mouth once daily 09/11/22   Genia Del, MD  omeprazole (PRILOSEC) 20 MG capsule Take 20 mg by mouth daily.    [provider]  rosuvastatin (CRESTOR) 10 MG tablet Take 10 mg by mouth daily.    [provider]  sertraline (ZOLOFT) 100 MG tablet Take 200 mg by mouth daily.    [provider]  VYVANSE 60 MG capsule Take 60 mg by mouth every morning. 01/24/21   [provider]      Allergies    Patient has no known allergies.    Review of Systems   Review of Systems  Physical Exam Updated Vital Signs BP 112/73   Pulse 90   Temp 97.9 F (36.6 C) (Oral)   Resp (!) 25   Wt 73 kg   SpO2 99%   BMI 26.78 kg/m  Physical Exam Constitutional:      Comments: Patient was lightly sleeping and withdrawn during initial part of examination.  She is answering questions no respiratory distress.  HENT:     Mouth/Throat:     Pharynx: Oropharynx is clear.  Eyes:     Extraocular Movements: Extraocular movements intact.  Cardiovascular:     Rate and Rhythm: Normal rate and regular rhythm.  Pulmonary:     Effort: Pulmonary effort is normal.     Breath sounds: Normal breath sounds.  Abdominal:      Comments: Abdomen soft.  Patient endorses some diffuse tenderness no guarding.  Musculoskeletal:        General: No swelling or tenderness. Normal range of motion.     Right lower leg: No edema.     Left lower leg: No edema.  Skin:    General: Skin is warm and dry.  Neurological:     General: No focal deficit present.     Mental Status: She is oriented to person, place, and time.     Comments: Patient is not tremulous at rest.  However when awakened and interacting she became extremely stressed and panicked crying when we discussed the possibility of hospitalization for suicidal ideation.  She became much more tremulous and most inconsolable.  No focal motor deficits.  Patient does not appear to be responding to internal stimuli at this time.  She does appear situationally oriented.  Psychiatric:     Comments: Withdrawn and extremely anxious.     ED Results / Procedures / Treatments   Labs (all labs ordered are listed, but only abnormal results are displayed) Labs Reviewed  COMPREHENSIVE METABOLIC PANEL - Abnormal; Notable for the following components:      Result Value   CO2 20 (*)    AST 13 (*)    Total Bilirubin 1.8 (*)    All other components within normal limits  SALICYLATE LEVEL - Abnormal; Notable for the following components:   Salicylate Lvl <7.0 (*)    All other components within normal limits  ACETAMINOPHEN LEVEL - Abnormal; Notable for the following components:   Acetaminophen (Tylenol), Serum <10 (*)    All other components within normal limits  LITHIUM LEVEL - Abnormal; Notable for the following components:   Lithium Lvl 0.34 (*)    All other components within normal limits  SARS CORONAVIRUS 2 BY RT PCR  ETHANOL  CBC  RAPID URINE DRUG SCREEN, HOSP PERFORMED  PREGNANCY, URINE    EKG None  Radiology No results found.  Procedures Procedures  {Document cardiac monitor, telemetry assessment procedure when appropriate:1}  Medications Ordered in  ED Medications  LORazepam (ATIVAN) injection 1 mg (has no administration in time range)  sodium chloride 0.9 % bolus 1,000 mL (0 mLs Intravenous Stopped 10/29/22 2144)  LORazepam (ATIVAN) injection 2 mg (2 mg Intravenous Given 10/29/22 2112)    ED Course/ Medical Decision Making/ A&P   {   Click here for ABCD2, HEART and other calculatorsREFRESH Note before signing :1}                              Medical Decision Making Amount and/or Complexity of Data Reviewed Labs: ordered.  Risk Prescription  drug management.   Patient presents as outlined.  She has had increasing anxiety and tearfulness over at least several days.  She also describes some hallucinations.  At 1 point there may have been auditory hallucinations but she also describes visual hallucinations.  This may be multifactorial with possible benzodiazepine withdrawal although, patient does seem well oriented situationally and is not exhibiting acutely psychotic symptoms.  She does describe some chronic significant mental health issues with chronic suicidal fantasies and at least nondistracting hallucinations.  At this time we will proceed with rehydration and administration of Ativan for combination of potential benzodiazepine withdrawal and severe anxiety.  Will reassess the patient when she is more calm and will review treatment options.  23: 15 patient is reassessed.  She is resting but awake, she is tremulous and has a waxing and waning level of insight and situational awareness.  There is some tangential hallucinatory type speech and then she reorients and is tearful.  Patient is fearful of hospitalization however at this time, after administration of Ativan she is exhibiting continued tremulousness and altered sensorium concerning for benzodiazepine withdrawal versus psychotic break.  Patient is aware of psychosis and feels that she is having a severe breakdown.  However at this time I do not feel that she is medically cleared from  perspective of commingled benzodiazepine withdrawal.  At this time we will plan for admission with hydration and treatment for suspected benzodiazepine withdrawal.  I will plan for IVC given concern for psychosis and lack of insightful decision making that could result in personal and significant injury.  Patient has passive suicidal ideation.  At this time she does not have plan but has been having increasingly truce of thoughts.  Patient is medically clear from perspective of potential benzo withdrawal, she may need TTS eval for passive suicidal ideation.  This time, patient cannot be reasonably cleared.  {Document critical care time when appropriate:1} {Document review of labs and clinical decision tools ie heart score, Chads2Vasc2 etc:1}  {Document your independent review of radiology images, and any outside records:1} {Document your discussion with family members, caretakers, and with consultants:1} {Document social determinants of health affecting pt's care:1} {Document your decision making why or why not admission, treatments were needed:1} Final Clinical Impression(s) / ED Diagnoses Final diagnoses:  Benzodiazepine withdrawal with perceptual disturbance (HCC)  Panic anxiety syndrome  Suicidal ideation    Rx / DC Orders ED Discharge Orders     None

## 2022-10-30 DIAGNOSIS — Z5986 Financial insecurity: Secondary | ICD-10-CM | POA: Diagnosis not present

## 2022-10-30 DIAGNOSIS — F1210 Cannabis abuse, uncomplicated: Secondary | ICD-10-CM | POA: Diagnosis present

## 2022-10-30 DIAGNOSIS — F13939 Sedative, hypnotic or anxiolytic use, unspecified with withdrawal, unspecified: Secondary | ICD-10-CM

## 2022-10-30 DIAGNOSIS — Z975 Presence of (intrauterine) contraceptive device: Secondary | ICD-10-CM | POA: Diagnosis not present

## 2022-10-30 DIAGNOSIS — E785 Hyperlipidemia, unspecified: Secondary | ICD-10-CM | POA: Diagnosis present

## 2022-10-30 DIAGNOSIS — F13232 Sedative, hypnotic or anxiolytic dependence with withdrawal with perceptual disturbance: Secondary | ICD-10-CM | POA: Diagnosis present

## 2022-10-30 DIAGNOSIS — I10 Essential (primary) hypertension: Secondary | ICD-10-CM | POA: Diagnosis present

## 2022-10-30 DIAGNOSIS — Z801 Family history of malignant neoplasm of trachea, bronchus and lung: Secondary | ICD-10-CM | POA: Diagnosis not present

## 2022-10-30 DIAGNOSIS — F410 Panic disorder [episodic paroxysmal anxiety] without agoraphobia: Secondary | ICD-10-CM | POA: Diagnosis present

## 2022-10-30 DIAGNOSIS — F603 Borderline personality disorder: Secondary | ICD-10-CM | POA: Diagnosis present

## 2022-10-30 DIAGNOSIS — K219 Gastro-esophageal reflux disease without esophagitis: Secondary | ICD-10-CM | POA: Diagnosis present

## 2022-10-30 DIAGNOSIS — Z79899 Other long term (current) drug therapy: Secondary | ICD-10-CM | POA: Diagnosis not present

## 2022-10-30 DIAGNOSIS — F32A Depression, unspecified: Secondary | ICD-10-CM | POA: Diagnosis present

## 2022-10-30 DIAGNOSIS — Z1152 Encounter for screening for COVID-19: Secondary | ICD-10-CM | POA: Diagnosis not present

## 2022-10-30 DIAGNOSIS — Z87410 Personal history of cervical dysplasia: Secondary | ICD-10-CM | POA: Diagnosis not present

## 2022-10-30 DIAGNOSIS — F5104 Psychophysiologic insomnia: Secondary | ICD-10-CM | POA: Diagnosis present

## 2022-10-30 DIAGNOSIS — F909 Attention-deficit hyperactivity disorder, unspecified type: Secondary | ICD-10-CM | POA: Diagnosis present

## 2022-10-30 DIAGNOSIS — R45851 Suicidal ideations: Secondary | ICD-10-CM | POA: Insufficient documentation

## 2022-10-30 DIAGNOSIS — K5909 Other constipation: Secondary | ICD-10-CM | POA: Diagnosis present

## 2022-10-30 DIAGNOSIS — R443 Hallucinations, unspecified: Secondary | ICD-10-CM | POA: Insufficient documentation

## 2022-10-30 DIAGNOSIS — F13931 Sedative, hypnotic or anxiolytic use, unspecified with withdrawal delirium: Secondary | ICD-10-CM | POA: Diagnosis not present

## 2022-10-30 LAB — HIV ANTIBODY (ROUTINE TESTING W REFLEX): HIV Screen 4th Generation wRfx: NONREACTIVE

## 2022-10-30 LAB — RAPID URINE DRUG SCREEN, HOSP PERFORMED
Amphetamines: POSITIVE — AB
Barbiturates: NOT DETECTED
Benzodiazepines: POSITIVE — AB
Cocaine: NOT DETECTED
Opiates: NOT DETECTED
Tetrahydrocannabinol: POSITIVE — AB

## 2022-10-30 LAB — FERRITIN: Ferritin: 89 ng/mL (ref 11–307)

## 2022-10-30 LAB — SARS CORONAVIRUS 2 BY RT PCR: SARS Coronavirus 2 by RT PCR: NEGATIVE

## 2022-10-30 MED ORDER — LORAZEPAM 2 MG/ML IJ SOLN
1.0000 mg | INTRAMUSCULAR | Status: DC | PRN
  Administered 2022-10-30: 2 mg via INTRAVENOUS
  Filled 2022-10-30: qty 1

## 2022-10-30 MED ORDER — OLANZAPINE 5 MG PO TABS
10.0000 mg | ORAL_TABLET | Freq: Every day | ORAL | Status: DC
  Administered 2022-10-30: 10 mg via ORAL
  Filled 2022-10-30: qty 2

## 2022-10-30 MED ORDER — ACETAMINOPHEN 325 MG PO TABS
650.0000 mg | ORAL_TABLET | Freq: Four times a day (QID) | ORAL | Status: DC | PRN
  Administered 2022-10-30: 650 mg via ORAL
  Filled 2022-10-30: qty 2

## 2022-10-30 MED ORDER — OLANZAPINE 5 MG PO TABS
5.0000 mg | ORAL_TABLET | Freq: Two times a day (BID) | ORAL | Status: DC
  Administered 2022-10-31 (×2): 5 mg via ORAL
  Filled 2022-10-30 (×2): qty 1

## 2022-10-30 MED ORDER — ALPRAZOLAM 0.5 MG PO TABS
0.5000 mg | ORAL_TABLET | Freq: Three times a day (TID) | ORAL | Status: DC | PRN
  Administered 2022-10-30: 0.5 mg via ORAL
  Filled 2022-10-30: qty 1

## 2022-10-30 MED ORDER — ENOXAPARIN SODIUM 40 MG/0.4ML IJ SOSY
40.0000 mg | PREFILLED_SYRINGE | INTRAMUSCULAR | Status: DC
  Administered 2022-10-30: 40 mg via SUBCUTANEOUS
  Filled 2022-10-30 (×2): qty 0.4

## 2022-10-30 MED ORDER — LORAZEPAM 1 MG PO TABS
1.0000 mg | ORAL_TABLET | ORAL | Status: DC | PRN
  Administered 2022-10-30: 2 mg via ORAL
  Filled 2022-10-30: qty 2

## 2022-10-30 MED ORDER — OLANZAPINE 5 MG PO TABS
2.5000 mg | ORAL_TABLET | Freq: Once | ORAL | Status: AC
  Administered 2022-10-30: 2.5 mg via ORAL
  Filled 2022-10-30: qty 1

## 2022-10-30 MED ORDER — SODIUM CHLORIDE 0.9 % IV SOLN: INTRAVENOUS | Status: AC

## 2022-10-30 NOTE — Consult Note (Signed)
[facility] Face-to-Face Psychiatry Consult   Reason for Consult:  Self harm Referring Physician:  Dr. Loney Loh Patient Identification: Abigail Higgins MRN:  [id number] Principal Diagnosis: Benzodiazepine withdrawal (HCC) Diagnosis:  Principal Problem:   Benzodiazepine withdrawal (HCC) Active Problems:   HLD (hyperlipidemia)   HTN (hypertension)   GERD (gastroesophageal reflux disease)   Chronic constipation   Marijuana abuse   Suicidal ideation   Hallucinations   Total Time Spent in Direct Patient Care:  I personally spent 120 minutes on the unit in direct patient care. The direct patient care time included face-to-face time with the patient, reviewing the patient's chart, communicating with other professionals, and coordinating care. Greater than 50% of this time was spent in counseling or coordinating care with the patient regarding goals of hospitalization, psycho-education, and discharge planning needs.    Subjective: "I am not suicidal.  I sometimes have suicidal thoughts, but I have never acted on them, I am just dealing with them.  I made a promise to my husband and father that I would never harm myself, and they promised me they would never psychiatrically admit me."   HPI:  Abigail Higgins is a 42 y.o. female patient  with medical history significant of hypertension, hyperlipidemia, recurrent depression requiring ECT in the past, anxiety, ADD, substance abuse, GERD, chronic constipation presented to ED due to worsening anxiety and difficulty sleeping over the past week.  She was taking extra doses of her regular prescribed Xanax and ran out 2 days early.  Symptoms have escalated with tremors, crying, and sleeplessness.  Patient endorsed extreme anxiety, auditory/visual hallucinations, and also suicidal ideation without plan.  IVC paperwork done by ED provider.  Psychiatry consult placed for suicidal ideation, anxiety, insomnia, auditory and visual hallucinations.  On assessment today,  patient is sitting in bed.  Her husband, Abigail Higgins, is at the bedside.  Patient is agreeable to husband staying at bedside and providing collateral information.  Patient currently is under the care of a psychiatrist Dr. Evelene Croon for the past 4 years.  She is currently being prescribed Vyvanse 60 mg for ADHD for many years; Zoloft 200 mg every morning for depression since January 2024; lithium 450 mg at bedtime for mood stability for the past 1-2 years when Seroquel was discontinued; BuSpar 30 mg twice daily for anxiety; trazodone 200 mg at bedtime for sleep.  Patient states she takes aripiprazole, however this is not on any other medication lists.  She is uncertain of the dose.  Patient also admits to smoking  CBD at night to help with sleep. Recently patient was prescribed Ambien for sleep which she did not find effective.  She did not continue this medication.  She has also not continued on trazodone as this was ineffective for sleep.  She admits that she is prescribed Xanax 4 times daily which is effective in managing her anxiety through the day but is becoming less effective.  When all other sleep aids did not work, she admits to using 2-3 Xanax at night to help her fall asleep, thus causing her to run out early and going through withdrawal. Patient describes that she feels overstimulated at night, and is unable to shut her mind down. Patient describes recent stressor of her adoptive father's death on 05/10/2024after an 31-month battle with lung cancer.  She states that since that time she has felt like she has had the life force sucked out of her and she has been exhausted and fatigued.  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  She states that it has been difficult though to get up and out of bed for the past 1-2 weeks.  She continues to enjoy time with her 2 dogs (a boxer makes an Engineer, production) and has restarted doing [place]/coloring.  She describes  low energy and concentration, and relies on her Vyvanse to get her going through the day.  Her appetite had been stable up to the past week when she was sick. Patient states that she has AVH daily- hears muffled conversations, sometimes can make out what is being said, but "not talking to me".  Sees figures/traffic that might say hello- intradimensional platform Knows difference between intuition and what's not real.  She describes a difficult relationship with her adoptive mother, but sees her once a week to be supportive.  She states that her husband asks as a buffer to prevent tension. Patient specifically denies any suicidal ideation, plan, or intent.  She states that she is hoping to be home prior to Saturday so that she can take her mother out for lunch.  She specifically denies any homicidal ideation. Husband does not endorse any concerns for patient's safety.  They have been married for 9 years and he has known him for 11 years.  In this time, she has not exhibited any self harm or suicidal gestures.  At this time, the patient states that she feels overmedicated and "not myself".  She is interested in reducing her medication burden.  I provided her with education regarding benzodiazepine dependence, and effects of THC on anxiety and sleep. There are weapons in the home, however they are secured from patient.  Collateral: A call was placed to patient's primary psychiatrist's office to discuss discharge planning regarding medications.  A detailed message was left securely with Dr. Carie Caddy staff for a return call. A call was made in the presence of patient and patient's husband.   Past Psychiatric History: ADHD since childhood, treated with stimulants and play therapy; bipolar depression She admits a psychiatric hospitalization at age 18 years for a "breakdown" and self-harm. Patient reports using a hair dryer wire on her back in order to feel pain as a child.  She denies any recent self-harm for  many years. Patient was alcoholic, but has maintained sobriety for 20 years. Diagnosed with borderline personality disorder   Risk to Self:  denies Risk to Others:  denies Prior Inpatient Therapy:   at 42 years old Prior Outpatient Therapy:  Psychiatry and therapy since childhood for ADHD, depression and borderline personality disorder.   Past Medical History:  Past Medical History:  Diagnosis Date   ADD (attention deficit disorder)    Anxiety    CIN I (cervical intraepithelial neoplasia I)    Depression    IC (interstitial cystitis)    Mild acid reflux WATCHES DIET   Urethral disorder URETHRAL SPRAYING    Past Surgical History:  Procedure Laterality Date   COLPOSCOPY     CYSTOSCOPY WITH URETHRAL DILATATION N/A 08/02/2012   Procedure: CYSTOSCOPY WITH URETHRAL DILATATION WITH INSTILLATION OF PYRIDIUM AND PELVIC EXAM;  Surgeon: Kathi Ludwig, MD;  Location: [facility];  Service: Urology;  Laterality: N/A;   INTRAUTERINE DEVICE INSERTION  03/05/2017   MIRENA   RHINOPLASTY  AGE 8   TONSILLECTOMY  AGE 32   Family History:  Family History  Adopted: Yes   Family Psychiatric  History: adopted at 1 month.  Bio family history not known.   Social History:  Social History   Substance and Sexual Activity  Alcohol Use No   Alcohol/week: 0.0 standard drinks of alcohol     Social History   Substance and Sexual Activity  Drug Use No    Social History   Socioeconomic History   Marital status: Married    Spouse name: Not on file   Number of children: Not on file   Years of education: Not on file   Highest education level: Not on file  Occupational History   Not on file  Tobacco Use   Smoking status: Former    Current packs/day: 0.00    Average packs/day: 0.5 packs/day for 7.0 years (3.5 ttl pk-yrs)    Types: Cigarettes    Start date: 01/16/2008    Quit date: 01/16/2015    Years since quitting: 7.7   Smokeless tobacco: Never  Vaping Use   Vaping  status: Former  Substance and Sexual Activity   Alcohol use: No    Alcohol/week: 0.0 standard drinks of alcohol   Drug use: No   Sexual activity: Yes    Birth control/protection: I.U.D., Condom    Comment: 1st intercourse- 13, partners- greater than 5 Mirena IUD 03/05/2017  Other Topics Concern   Not on file  Social History Narrative   Significant other - Rocky Crafts   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   Previous hx of substance abuse    Social Determinants of Health   Financial Resource Strain: Medium Risk (07/08/2022)   Received from [facility], [facility]   Overall Financial Resource Strain (CARDIA)    Difficulty of Paying Living Expenses: Somewhat hard  Food Insecurity: Food Insecurity Present (07/08/2022)   Received from [facility], [facility]   Hunger Vital Sign    Worried About Running Out of Food in the Last Year: Sometimes true    Ran Out of Food in the Last Year: Sometimes true  Transportation Needs: No Transportation Needs (07/08/2022)   Received from [facility], [facility]   PRAPARE - Transportation    Lack of Transportation (Medical): No    Lack of Transportation (Non-Medical): No  Physical Activity: Sufficiently Active (01/14/2021)   Received from [facility], [facility]   Exercise Vital Sign    Days of Exercise per Week: 5 days    Minutes of Exercise per Session: 30 min  Stress: Stress Concern Present (01/14/2021)   Received from [facility], [facility] - Occupational Stress Questionnaire    Feeling of Stress : Very much  Social Connections: Unknown (07/18/2021)   Received from [facility], [facility]   Social Network    Social Network: Not on file   Additional Social History:  First marriage, lives with husband, Abigail Higgins, x 9 years  She has 2 Ambulance person and 2 grandchildren   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  She was 8 AM-5 PM  Adoptive father passed away from lung cancer 07-15-22 he was an anesthesiologist Adoptive mother is living, was a Engineer, civil (consulting) and she and patient had strained relationship Patient celebrates her re-  birthday on [birth year] when she was adopted at 1 month of age    Allergies:  No Known Allergies  Labs:  Results for orders placed or performed during the hospital encounter of 10/29/22 (from the past 48 hour(s))  Comprehensive metabolic panel  Status: Abnormal   Collection Time: 10/29/22  7:17 PM  Result Value Ref Range   Sodium 139 135 - 145 mmol/L   Potassium 3.6 3.5 - 5.1 mmol/L   Chloride 107 98 - 111 mmol/L   CO2 20 (L) 22 - 32 mmol/L   Glucose, Bld 94 70 - 99 mg/dL    Comment: Glucose reference range applies only to samples taken after fasting for at least 8 hours.   BUN 13 6 - 20 mg/dL   Creatinine, Ser 1.61 0.44 - 1.00 mg/dL   Calcium 09.6 8.9 - 04.5 mg/dL   Total Protein 7.5 6.5 - 8.1 g/dL   Albumin 4.6 3.5 - 5.0 g/dL   AST 13 (L) 15 - 41 U/L   ALT 11 0 - 44 U/L   Alkaline Phosphatase 48 38 - 126 U/L   Total Bilirubin 1.8 (H) 0.3 - 1.2 mg/dL   GFR, Estimated >40 >98 mL/min    Comment: (NOTE) Calculated using the CKD-EPI Creatinine Equation (2021)    Anion gap 12 5 - 15    Comment: Performed at [facility], [address], [place], [place] [address]  Ethanol     Status: None   Collection Time: 10/29/22  7:17 PM  Result Value Ref Range   Alcohol, Ethyl (B) <10 <10 mg/dL    Comment: (NOTE) Lowest detectable limit for serum alcohol is 10 mg/dL.  For medical purposes only. Performed at [facility], [address], [place], [place] [address]   Salicylate level     Status: Abnormal   Collection Time: 10/29/22  7:17 PM  Result Value Ref Range   Salicylate Lvl <7.0 (L) 7.0 - 30.0 mg/dL    Comment: Performed at [facility], [address], [place], [place] [address]  Acetaminophen  level     Status: Abnormal   Collection Time: 10/29/22  7:17 PM  Result Value Ref Range   Acetaminophen (Tylenol), Serum <10 (L) 10 - 30 ug/mL    Comment: (NOTE) Therapeutic concentrations vary significantly. A range of 10-30 ug/mL  may be an effective concentration for many patients. However, some  are best treated at concentrations outside of this range. Acetaminophen concentrations >150 ug/mL at 4 hours after ingestion  and >50 ug/mL at 12 hours after ingestion are often associated with  toxic reactions.  Performed at [facility], [address] [place], [place] [address]   cbc     Status: None   Collection Time: 10/29/22  7:17 PM  Result Value Ref Range   WBC 10.0 4.0 - 10.5 K/uL   RBC 4.52 3.87 - 5.11 MIL/uL   Hemoglobin 14.1 12.0 - 15.0 g/dL   HCT 46.9 62.9 - 52.8 %   MCV 90.9 80.0 - 100.0 fL   MCH 31.2 26.0 - 34.0 pg   MCHC 34.3 30.0 - 36.0 g/dL   RDW 41.3 24.4 - 01.0 %   Platelets 280 150 - 400 K/uL   nRBC 0.0 0.0 - 0.2 %    Comment: Performed at [facility], [address] [place], [place] [address]  Rapid urine drug screen (hospital performed)     Status: Abnormal   Collection Time: 10/29/22  7:17 PM  Result Value Ref Range   Opiates NONE DETECTED NONE DETECTED   Cocaine NONE DETECTED NONE DETECTED   Benzodiazepines POSITIVE (A) NONE DETECTED   Amphetamines POSITIVE (A) NONE DETECTED   Tetrahydrocannabinol POSITIVE (A) NONE DETECTED   Barbiturates NONE DETECTED NONE DETECTED  Comment: (NOTE) DRUG SCREEN FOR MEDICAL PURPOSES ONLY.  IF CONFIRMATION IS NEEDED FOR ANY PURPOSE, NOTIFY LAB WITHIN 5 DAYS.  LOWEST DETECTABLE LIMITS FOR URINE DRUG SCREEN Drug Class                     Cutoff (ng/mL) Amphetamine and metabolites    1000 Barbiturate and metabolites    200 Benzodiazepine                 200 Opiates and metabolites        300 Cocaine and metabolites        300 THC                            50 Performed at  [facility], [address] [place], [place] [address]   Pregnancy, urine     Status: None   Collection Time: 10/29/22  7:17 PM  Result Value Ref Range   Preg Test, Ur NEGATIVE NEGATIVE    Comment:        THE SENSITIVITY OF THIS METHODOLOGY IS >25 mIU/mL. Performed at [facility], [address] [place], [place] [address]   Lithium level     Status: Abnormal   Collection Time: 10/29/22  9:15 PM  Result Value Ref Range   Lithium Lvl 0.34 (L) 0.60 - 1.20 mmol/L    Comment: Performed at [facility], [address] [place], [place] [address]  SARS Coronavirus 2 by RT PCR (hospital order, performed in [facility] hospital lab) *cepheid single result test* Anterior Nasal Swab     Status: None   Collection Time: 10/29/22 11:03 PM   Specimen: Anterior Nasal Swab  Result Value Ref Range   SARS Coronavirus 2 by RT PCR NEGATIVE NEGATIVE    Comment: (NOTE) SARS-CoV-2 target nucleic acids are NOT DETECTED.  The SARS-CoV-2 RNA is generally detectable in upper and lower respiratory specimens during the acute phase of infection. The lowest concentration of SARS-CoV-2 viral copies this assay can detect is 250 copies / mL. A negative result does not preclude SARS-CoV-2 infection and should not be used as the sole basis for treatment or other patient management decisions.  A negative result may occur with improper specimen collection / handling, submission of specimen other than nasopharyngeal swab, presence of viral mutation(s) within the areas targeted by this assay, and inadequate number of viral copies (<250 copies / mL). A negative result must be combined with clinical observations, patient history, and epidemiological information.  Fact Sheet for Patients:   RoadLapTop.co.za  Fact Sheet for Healthcare Providers: http://kim-miller.com/  This test is not yet approved or  cleared by the [place] FDA and has been authorized for detection and/or diagnosis of SARS-CoV-2 by FDA under an Emergency Use Authorization (EUA).  This EUA will remain in effect (meaning this test can be used) for the duration of the COVID-19 declaration under Section 564(b)(1) of the Act, 21 U.S.C. section 360bbb-3(b)(1), unless the authorization is terminated or revoked sooner.  Performed at [facility], [address], [place], [place] [address]   HIV Antibody (routine testing w rflx)     Status: None   Collection Time: 10/30/22  4:59 AM  Result Value Ref Range   HIV Screen 4th Generation wRfx Non Reactive Non Reactive    Comment: Performed at [facility], [address], [place], [place] [address]    Current  Facility-Administered Medications  Medication Dose Route Frequency Provider Last Rate Last Admin   0.9 %  sodium chloride infusion   Intravenous Continuous John Giovanni, MD 125 mL/hr at 10/30/22 0442 New Bag at 10/30/22 0442   enoxaparin (LOVENOX) injection 40 mg  40 mg Subcutaneous Q24H John Giovanni, MD   40 mg at 10/30/22 1016   LORazepam (ATIVAN) tablet 1-4 mg  1-4 mg Oral Q1H PRN John Giovanni, MD       Or   LORazepam (ATIVAN) injection 1-4 mg  1-4 mg Intravenous Q1H PRN John Giovanni, MD   2 mg at 10/30/22 0742   OLANZapine (ZYPREXA) tablet 2.5 mg  2.5 mg Oral Once Mariel Craft, MD        Musculoskeletal: Strength & Muscle Tone: decreased Gait & Station: unsteady Patient leans: N/A   Psychiatric Specialty Exam:  Presentation  General Appearance: Casual  Eye Contact:Good  Speech:Clear and Coherent  Speech Volume:Normal  Handedness:Right   Mood and Affect  Mood:Anxious; Dysphoric  Affect:Congruent   Thought Process  Thought Processes:Coherent; Goal Directed  Descriptions of Associations:Intact  Orientation:Full (Time, Place and Person)  Thought Content:Rumination  History of Schizophrenia/Schizoaffective  disorder:No data recorded Duration of Psychotic Symptoms:No data recorded Hallucinations:Hallucinations: None  Ideas of Reference:None  Suicidal Thoughts:Suicidal Thoughts: No  Homicidal Thoughts:Homicidal Thoughts: No   Sensorium  Memory:Immediate Good; Recent Good; Remote Good  Judgment:Impaired  Insight:Fair   Executive Functions  Concentration:Good  Attention Span:Good  Recall:Good  Fund of Knowledge:Good  Language:Good   Psychomotor Activity  Psychomotor Activity:Psychomotor Activity: Decreased   Assets  Assets:Communication Skills; Desire for Improvement; Financial Resources/Insurance; Housing; Intimacy; Resilience; Social Support; Vocational/Educational   Sleep  Sleep:Sleep: Poor   Physical Exam: Physical Exam HENT:     Head: Normocephalic.  Cardiovascular:     Pulses: Normal pulses.  Pulmonary:     Effort: Pulmonary effort is normal. No respiratory distress.  Musculoskeletal:        General: No swelling.  Skin:    General: Skin is warm and dry.  Neurological:     General: No focal deficit present.     Mental Status: She is alert and oriented to person, place, and time.  Psychiatric:     Comments: anxious    Review of Systems  Psychiatric/Behavioral:  Positive for depression and substance abuse (prescribed stimulants and benzodiazepines). Negative for hallucinations, memory loss and suicidal ideas. The patient is nervous/anxious and has insomnia.    Blood pressure (!) 144/92, pulse 93, temperature 98.2 F (36.8 C), temperature source Oral, resp. rate 17, weight 73 kg, SpO2 97%. Body mass index is 26.78 kg/m.  Treatment Plan Summary: Daily contact with patient to assess and evaluate symptoms and progress in treatment and Medication management  Disposition: No evidence of imminent risk to self or others at present.     ## Psychiatric Medication Recommendations:  -- Provided Zyprexa 2.5 mg PRN for anxiety/agitation, which allowed patient.   Of rest.  Patient requests to continue Zyprexa scheduled --Start Zyprexa 5 mg with breakfast and lunch and 10 mg at bedtime -- Continue CIWA protocol -- Hold other psychotropic medication at this time.  Patient is desiring to wean off of medications.     ## Medical Decision Making Capacity:  Capacity was not formally addressed during this encounter; however, the patient appeared to understand and participate in the discussion about their treatment plan at least on a superficial level.    ## Further Work-up:  -- per primary   -- most recent  EKG 10/29/2022-QTc 437 -- Pertinent labwork reviewed earlier this admission includes: Added ferritin level for complaints of restless leg syndrome, and 89; HIV nonreactive; COVID negative; lithium levels below 0.34 (patient noted that she had missed dose prior to admission); urine pregnancy negative; urine drug screen positive for benzodiazepines, amphetamines, tetrahydrocannabinol; CBC, CMP essentially unremarkable; no elevation in Tylenol, salicylate, or ethanol; UA with reflex C&S without growth  ## Disposition:  -- Patient is currently under IVC, however does not appear to meet criteria for inpatient psychiatric admission. -- We will continue to reassess after she has completed withdrawal protocol for benzodiazepines   ## Behavioral / Environmental:  --  Utilize compassion and acknowledge the patient's experiences while setting clear and realistic expectations for care.   ## Safety and Observation Level:  - Based on my clinical evaluation, I estimate the patient to be at low  risk of self harm. - At this time, we recommend 1: 1 level of observation for safety. This decision is based on my review of the chart including patient's history and current presentation, interview of the patient, mental status examination, and consideration of suicide risk including evaluating suicidal ideation, plan, intent, suicidal or self-harm behaviors, risk factors, and  protective factors. This judgment is based on our ability to directly address suicide risk, implement suicide prevention strategies and develop a safety plan while the patient is in the clinical setting. Please contact our team if there is a concern that risk level has changed.     Thank you for this consult request. Recommendations have been communicated to the primary team.  We will follow at this time to reevaluate effectiveness of Zyprexa.   Will ensure ECG following initiation of antipsychotic medication.   Mariel Craft, MD Mariel Craft, MD 10/30/2022 12:31 PM

## 2022-10-30 NOTE — Progress Notes (Signed)
Courtesy visit - no billing.  Patient is seen and examined today morning. She is tremulous, had panic attack, aggressive with staff this morning, got ativan per CIWA protocol. Patient is admitted for confusion, visual, auditory hallucinations, benzo withdrawal, depression. Psychiatry evaluation called by admitting physician. Significant other at bedside. Low dose xanax ordered. Sitter at bedside. Further management as per clinical course.

## 2022-10-30 NOTE — ED Notes (Signed)
[phone] significant other for updates.

## 2022-10-30 NOTE — H&P (Signed)
History and Physical    Abigail Higgins MVH:[id number] DOB: [birth year] DOA: 10/29/2022  PCP: Porfirio Oar, PA  Patient coming from: [facility] ED  Chief Complaint: Anxiety  HPI: Abigail Higgins is a 42 y.o. female with medical history significant of hypertension, hyperlipidemia, recurrent depression requiring ECT in the past, anxiety, ADD, substance abuse, GERD, chronic constipation presented to ED due to worsening anxiety and difficulty sleeping over the past week.  She was taking extra doses of her regular prescribed Xanax and ran out 2 days early.  Symptoms have escalated with tremors, crying, and sleeplessness.  Patient endorsed extreme anxiety, auditory/visual hallucinations, and also suicidal ideation without plan.  IVC paperwork done by ED provider.  Patient was tachycardic to the 110s on arrival to the ED, later improved.  Afebrile.  Labs showing no leukocytosis, bicarb 20, glucose 94, T. bili 1.8 and no elevation of transaminases or alkaline phosphatase, blood ethanol level undetectable, salicylate level <7.0, acetaminophen level <10, lithium level 0.34, SARS-CoV-2 PCR negative.  UDS positive for benzodiazepines, amphetamines, and THC. She was admitted to [facility] for further management due to waxing and waning confusion and passive suicidal ideation in the setting of benzodiazepine withdrawal.  Patient received a total of IV Ativan 3 mg and 1 L normal saline in the ED.  History limited as patient is currently somnolent.  She reports taking extra doses of her home Xanax to help with uncontrolled anxiety and insomnia.  She ran out of Xanax 2 days ago.  Endorsing auditory and visual hallucinations.  Patient states "I have been fantasizing of different ways of hurting myself" but not able to elaborate.  She takes Vyvanse for ADD and denies taking any extra doses of this medication.  Reports marijuana use.  No other complaints.  Review of Systems:  Review of Systems  All other systems  reviewed and are negative.   Past Medical History:  Diagnosis Date   ADD (attention deficit disorder)    Anxiety    CIN I (cervical intraepithelial neoplasia I)    Depression    IC (interstitial cystitis)    Mild acid reflux WATCHES DIET   Urethral disorder URETHRAL SPRAYING    Past Surgical History:  Procedure Laterality Date   COLPOSCOPY     CYSTOSCOPY WITH URETHRAL DILATATION N/A 08/02/2012   Procedure: CYSTOSCOPY WITH URETHRAL DILATATION WITH INSTILLATION OF PYRIDIUM AND PELVIC EXAM;  Surgeon: Kathi Ludwig, MD;  Location: [facility];  Service: Urology;  Laterality: N/A;   INTRAUTERINE DEVICE INSERTION  03/05/2017   MIRENA   RHINOPLASTY  AGE 78   TONSILLECTOMY  AGE 1     reports that she quit smoking about 7 years ago. Her smoking use included cigarettes. She started smoking about 14 years ago. She has a 3.5 pack-year smoking history. She has never used smokeless tobacco. She reports that she does not drink alcohol and does not use drugs.  No Known Allergies  Family History  Adopted: Yes    Prior to Admission medications   Medication Sig Start Date End Date Taking? Authorizing Provider  ALPRAZolam Prudy Feeler) 1 MG tablet Take 1 mg by mouth 4 (four) times daily as needed.    [provider]  amLODipine (NORVASC) 5 MG tablet Take 5 mg by mouth daily. 12/19/20   [provider]  azelastine (ASTELIN) 0.1 % nasal spray two sprays by Both Nostrils route 2 (two) times daily. 11/13/21   [provider]  Biotin 84132 MCG TABS Take by  mouth.    [provider]  busPIRone (BUSPAR) 30 MG tablet Take by mouth.    [provider]  clobetasol ointment (TEMOVATE) 0.05 % Apply 1 Application topically 2 (two) times daily. 02/10/22   Olivia Mackie, NP  levonorgestrel (MIRENA) 20 MCG/24HR IUD 1 each by Intrauterine route once. Reported on 05/29/2015    [provider]  LINZESS 290 MCG CAPS capsule Take 290 mcg by  mouth daily. 01/12/21   [provider]  lithium carbonate (ESKALITH) 450 MG CR tablet Take 450 mg by mouth at bedtime. 02/11/21   [provider]  MYRBETRIQ 25 MG TB24 tablet Take 1 tablet by mouth once daily 09/11/22   Genia Del, MD  omeprazole (PRILOSEC) 20 MG capsule Take 20 mg by mouth daily.    [provider]  rosuvastatin (CRESTOR) 10 MG tablet Take 10 mg by mouth daily.    [provider]  sertraline (ZOLOFT) 100 MG tablet Take 200 mg by mouth daily.    [provider]  VYVANSE 60 MG capsule Take 60 mg by mouth every morning. 01/24/21   [provider]    Physical Exam: Vitals:   10/29/22 2300 10/29/22 2357 10/30/22 0200 10/30/22 0241  BP: 121/88  (!) 128/92 (!) 141/90  Pulse: (!) 101  87 84  Resp: (!) 22  16 (!) 21  Temp:  98.7 F (37.1 C) 98.5 F (36.9 C) 98.3 F (36.8 C)  TempSrc:  Oral Oral   SpO2: 99%  97% 97%  Weight:        Physical Exam Vitals reviewed.  Constitutional:      General: She is not in acute distress. HENT:     Head: Normocephalic and atraumatic.  Eyes:     Extraocular Movements: Extraocular movements intact.  Cardiovascular:     Rate and Rhythm: Normal rate and regular rhythm.     Pulses: Normal pulses.  Pulmonary:     Effort: Pulmonary effort is normal. No respiratory distress.     Breath sounds: Normal breath sounds. No wheezing or rales.  Abdominal:     General: Bowel sounds are normal. There is no distension.     Palpations: Abdomen is soft.     Tenderness: There is no abdominal tenderness.  Musculoskeletal:     Cervical back: Normal range of motion.     Right lower leg: No edema.     Left lower leg: No edema.  Skin:    General: Skin is warm and dry.  Neurological:     General: No focal deficit present.     Mental Status: She is alert and oriented to person, place, and time.     Cranial Nerves: No cranial nerve deficit.     Sensory: No sensory deficit.     Motor: No  weakness.     Labs on Admission: I have personally reviewed following labs and imaging studies  CBC: Recent Labs  Lab 10/29/22 1917  WBC 10.0  HGB 14.1  HCT 41.1  MCV 90.9  PLT 280   Basic Metabolic Panel: Recent Labs  Lab 10/29/22 1917  NA 139  K 3.6  CL 107  CO2 20*  GLUCOSE 94  BUN 13  CREATININE 0.69  CALCIUM 10.0   GFR: Estimated Creatinine Clearance: 90.8 mL/min (by C-G formula based on SCr of 0.69 mg/dL). Liver Function Tests: Recent Labs  Lab 10/29/22 1917  AST 13*  ALT 11  ALKPHOS 48  BILITOT 1.8*  PROT 7.5  ALBUMIN  4.6   No results for input(s): "LIPASE", "AMYLASE" in the last 168 hours. No results for input(s): "AMMONIA" in the last 168 hours. Coagulation Profile: No results for input(s): "INR", "PROTIME" in the last 168 hours. Cardiac Enzymes: No results for input(s): "CKTOTAL", "CKMB", "CKMBINDEX", "TROPONINI" in the last 168 hours. BNP (last 3 results) No results for input(s): "PROBNP" in the last 8760 hours. HbA1C: No results for input(s): "HGBA1C" in the last 72 hours. CBG: No results for input(s): "GLUCAP" in the last 168 hours. Lipid Profile: No results for input(s): "CHOL", "HDL", "LDLCALC", "TRIG", "CHOLHDL", "LDLDIRECT" in the last 72 hours. Thyroid Function Tests: No results for input(s): "TSH", "T4TOTAL", "FREET4", "T3FREE", "THYROIDAB" in the last 72 hours. Anemia Panel: No results for input(s): "VITAMINB12", "FOLATE", "FERRITIN", "TIBC", "IRON", "RETICCTPCT" in the last 72 hours. Urine analysis:    Component Value Date/Time   COLORURINE DARK YELLOW 08/19/2022 0945   APPEARANCEUR SLIGHTLY CLOUDY (A) 08/19/2022 0945   LABSPEC 1.020 08/19/2022 0945   PHURINE 6.0 08/19/2022 0945   GLUCOSEU NEGATIVE 08/19/2022 0945   GLUCOSEU NEGATIVE 03/28/2013 1015   HGBUR NEGATIVE 08/19/2022 0945   BILIRUBINUR NEGATIVE 08/16/2021 0703   BILIRUBINUR negative 05/04/2017 1504   BILIRUBINUR large 02/09/2014 0842   KETONESUR NEGATIVE  08/19/2022 0945   PROTEINUR TRACE (A) 08/19/2022 0945   UROBILINOGEN 0.2 05/04/2017 1504   UROBILINOGEN 0.2 08/01/2014 1345   NITRITE NEGATIVE 08/16/2021 0703   LEUKOCYTESUR NEGATIVE 08/16/2021 0703    Radiological Exams on Admission: No results found.  EKG: Independently reviewed.  Sinus rhythm, no significant change since previous EKG from over a year ago except QT interval has improved.  Assessment and Plan  Waxing and waning confusion, auditory/visual hallucinations, and suicidal ideation in the setting of suspected benzodiazepine withdrawal, marijuana abuse, and history of recurrent depression requiring ECT in the past, anxiety, ADD Patient reports taking extra doses of her regular prescribed Xanax due to uncontrolled anxiety and insomnia.  Ran out of Xanax 2 days ago and having auditory and visual hallucinations.  Also endorsing suicidal ideation.  Blood ethanol level undetectable, salicylate level <7.0, acetaminophen level <10, lithium level 0.34.  UDS positive for benzodiazepines, amphetamines, and THC.  She is on Vyvanse due to history of ADD and denies taking any extra doses of this medication.  Tachycardia resolved after 1 L IV fluids.  Placed on CIWA monitoring and IV Ativan as needed.  Continue IV fluid hydration.  Psychiatry consult placed.  Suicide and seizure precautions.  Hypertension Hyperlipidemia GERD Chronic constipation Pharmacy med rec pending.  DVT prophylaxis: Lovenox Code Status: Full code by default.  Patient does not have capacity for decision-making. Family Communication: No family available at this time. Consults called: Psychiatry Level of care: Telemetry bed Admission status: It is my clinical opinion that admission to INPATIENT is reasonable and necessary because of the expectation that this patient will require hospital care that crosses at least 2 midnights to treat this condition based on the medical complexity of the problems presented.  Given the  aforementioned information, the predictability of an adverse outcome is felt to be significant.   John Giovanni MD [facility]  If 7PM-7AM, please contact night-coverage [email or web address]  10/30/2022, 2:56 AM

## 2022-10-30 NOTE — Plan of Care (Signed)

## 2022-10-31 DIAGNOSIS — F410 Panic disorder [episodic paroxysmal anxiety] without agoraphobia: Secondary | ICD-10-CM | POA: Diagnosis not present

## 2022-10-31 DIAGNOSIS — F13931 Sedative, hypnotic or anxiolytic use, unspecified with withdrawal delirium: Secondary | ICD-10-CM | POA: Diagnosis not present

## 2022-10-31 MED ORDER — OLANZAPINE 5 MG PO TABS: 5.0000 mg | ORAL_TABLET | Freq: Two times a day (BID) | ORAL | 0 refills | Status: DC

## 2022-10-31 MED ORDER — OLANZAPINE 10 MG PO TABS: 10.0000 mg | ORAL_TABLET | Freq: Every day | ORAL | 0 refills | Status: DC

## 2022-10-31 NOTE — Consult Note (Signed)
[facility] Face-to-Face Psychiatry Consult   Reason for Consult:  Self harm Referring Physician:  Dr. Loney Loh Patient Identification: Abigail Higgins MRN:  [id number] Principal Diagnosis: Benzodiazepine withdrawal (HCC) Diagnosis:  Active Problems:   HLD (hyperlipidemia)   HTN (hypertension)   GERD (gastroesophageal reflux disease)   Chronic constipation   Marijuana abuse   Panic anxiety syndrome   Total Time Spent in Direct Patient Care:  I personally spent 40 minutes on the unit in direct patient care. The direct patient care time included face-to-face time with the patient, reviewing the patient's chart, communicating with other professionals, and coordinating care. Greater than 50% of this time was spent in counseling or coordinating care with the patient regarding goals of hospitalization, psycho-education, and discharge planning needs.    Subjective: "I feel better.  More like myself"   HPI:  Abigail Higgins is a 42 y.o. female patient  with medical history significant of hypertension, hyperlipidemia, recurrent depression requiring ECT in the past, anxiety, ADD, substance abuse, GERD, chronic constipation presented to ED due to worsening anxiety and difficulty sleeping over the past week.  She was taking extra doses of her regular prescribed Xanax and ran out 2 days early.  Symptoms have escalated with tremors, crying, and sleeplessness.  Patient endorsed extreme anxiety, auditory/visual hallucinations, and also suicidal ideation without plan.  IVC paperwork done by ED provider.  Psychiatry consult placed for suicidal ideation, anxiety, insomnia, auditory and visual hallucinations.  10/30/2022: patient is sitting in bed.  Her husband, Truddie Hidden, is at the bedside.  Patient is agreeable to husband staying at bedside and providing collateral information.  Patient currently is under the care of a psychiatrist Dr. Evelene Croon for the past 4 years.  She is currently being prescribed Vyvanse 60 mg for ADHD  for many years; Zoloft 200 mg every morning for depression since January 2024; lithium 450 mg at bedtime for mood stability for the past 1-2 years when Seroquel was discontinued; BuSpar 30 mg twice daily for anxiety; trazodone 200 mg at bedtime for sleep.  Patient states she takes aripiprazole, however this is not on any other medication lists.  She is uncertain of the dose.  Patient also admits to smoking  CBD at night to help with sleep. Recently patient was prescribed Ambien for sleep which she did not find effective.  She did not continue this medication.  She has also not continued on trazodone as this was ineffective for sleep.  She admits that she is prescribed Xanax 4 times daily which is effective in managing her anxiety through the day but is becoming less effective.  When all other sleep aids did not work, she admits to using 2-3 Xanax at night to help her fall asleep, thus causing her to run out early and going through withdrawal. Patient describes that she feels overstimulated at night, and is unable to shut her mind down. Patient describes recent stressor of her adoptive father's death on May 03, 2024after an 60-month battle with lung cancer.  She states that since that time she has felt like she has had the life force sucked out of her and she has been exhausted and fatigued.  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  She states that it has been difficult though to get up and out of bed for the past 1-2 weeks.  She continues to enjoy time with her 2 dogs (a boxer makes an Engineer, production) and has  restarted doing [place]/coloring.  She describes low energy and concentration, and relies on her Vyvanse to get her going through the day.  Her appetite had been stable up to the past week when she was sick. Patient states that she has AVH daily- hears muffled conversations, sometimes can make out what is being said, but "not talking to me".   Sees figures/traffic that might say hello- intradimensional platform Knows difference between intuition and what's not real.  She describes a difficult relationship with her adoptive mother, but sees her once a week to be supportive.  She states that her husband asks as a buffer to prevent tension. Patient specifically denies any suicidal ideation, plan, or intent.  She states that she is hoping to be home prior to Saturday so that she can take her mother out for lunch.  She specifically denies any homicidal ideation. Husband does not endorse any concerns for patient's safety.  They have been married for 9 years and he has known him for 11 years.  In this time, she has not exhibited any self harm or suicidal gestures.  At this time, the patient states that she feels overmedicated and "not myself".  She is interested in reducing her medication burden.  I provided her with education regarding benzodiazepine dependence, and effects of THC on anxiety and sleep. There are weapons in the home, however they are secured from patient.  Collateral: A call was placed to patient's primary psychiatrist's office to discuss discharge planning regarding medications.  A detailed message was left securely with Dr. Carie Caddy staff for a return call. A call was made in the presence of patient and patient's husband.   Patient's primary physician returned call to this provider at 1930 p.m. on 10/30/2022.  Patient presentation and medication misuse was reported to psychiatrist.  Patient's primary psychiatrist is willing to continue the patient, and the surprise that patient missed his medications without notifying her.  She  was appreciative of the call, and will ensure that she sees patient within 1 week following discharge.  Psychiatrist specifically denied any concerns for the patient's safety to include any suicidal ideation, plan, or intent.   10/31/2022: Patient seen this morning.  She is sitting upright in bed and  smiling.  She states that she slept well last night and her anxiety has been controlled with Zyprexa dosing.  She does still report mild anxiety regarding hospitalization and follow-up plans to come off of psychiatric polypharmacy.  She again denies any suicide attempt or thoughts of self-harm.  She specifically denies any SI or HI ideation, plan, or intent.  She denies any auditory or visual hallucinations.  She has not had any benzodiazepines since 1544 on 10/30/2022.  She is denying any withdrawal symptoms.  Patient is future oriented.  She denies concerns about meeting with her mother, and states that she did speak with her mother regarding her hospitalization and desired to discontinue multiple medications.  She stated that mother was supportive.  Confirmed that patient did make a follow-up appointment with her psychiatrist for Wednesday, August 21 at 6:15 PM.  Husband is available at bedside for safety planning. patient and husband were able to engage in safety planning including plan to return to nearest emergency room or contact emergency services if she feels unable to maintain her own safety or the safety of others. Patient had no further questions, comments, or concerns.  Plan to discharge into care of her husband, who agrees to maintain patient safety. Patient aware to return  to nearest crisis center, ED or to call 911 for worsening symptoms of depression, suicidal or homicidal thoughts or AVH.  Patient is provided information on the [facility] behavioral health urgent care for future behavioral health crisis situations as needed.  I have reviewed discharge medications with patient.  At this time, patient is desiring to stay on Zyprexa as monotherapy.  She understands that this can help with anxiety, sleep, mood stabilization and decreasing ruminating thoughts. She has tolerated coming off of Xanax, Ambien, trazodone, BuSpar, lithium, Zoloft, and Abilify.  She has not received Vyvanse while in the  hospital.  I have discussed with her decreasing Vyvanse dose that she is able as this may be contributing to her anxiety and wakefulness at night. Patient is open to cessation of marijuana.  She has successfully abstained from alcohol for 20 years, so I suspect that she will be successful with this. I have provided her with resources for cognitive behavioral therapy for insomnia a patient workbook as well as a  therapist guide, that she can use with her outpatient therapist for treatment of her chronic insomnia.  Patient is aware that she has a prescription from her outpatient psychiatrist for Xanax.  Her husband states that he intends to pick up this prescription and they will use only on an as needed basis.  I have encouraged patient to not use more than 1 a day.  She will restart other psychiatric medications as indicated with her outpatient psychiatrist. Reviewed risks, benefits, side effects, and adverse effects of olanzapine to include weight gain, increased blood sugar and cholesterol.  Patient is currently on metformin for weight management with good effect.  She is aware to monitor for these side effects and adjust medications with her outpatient providers.   Past Psychiatric History: ADHD since childhood, treated with stimulants and play therapy; bipolar depression She admits a psychiatric hospitalization at age 67 years for a "breakdown" and self-harm. Patient reports using a hair dryer wire on her back in order to feel pain as a child.  She denies any recent self-harm for many years. Patient was alcoholic, but has maintained sobriety for 20 years. Diagnosed with borderline personality disorder   Risk to Self:  denies Risk to Others:  denies Prior Inpatient Therapy:   at 42 years old Prior Outpatient Therapy:  Psychiatry and therapy since childhood for ADHD, depression and borderline personality disorder.   Past Medical History:  Past Medical History:  Diagnosis Date   ADD (attention  deficit disorder)    Anxiety    CIN I (cervical intraepithelial neoplasia I)    Depression    IC (interstitial cystitis)    Mild acid reflux WATCHES DIET   Urethral disorder URETHRAL SPRAYING    Past Surgical History:  Procedure Laterality Date   COLPOSCOPY     CYSTOSCOPY WITH URETHRAL DILATATION N/A 08/02/2012   Procedure: CYSTOSCOPY WITH URETHRAL DILATATION WITH INSTILLATION OF PYRIDIUM AND PELVIC EXAM;  Surgeon: Kathi Ludwig, MD;  Location: [facility];  Service: Urology;  Laterality: N/A;   INTRAUTERINE DEVICE INSERTION  03/05/2017   MIRENA   RHINOPLASTY  AGE 89   TONSILLECTOMY  AGE 46   Family History:  Family History  Adopted: Yes   Family Psychiatric  History: adopted at 1 month.  Bio family history not known.   Social History:  Social History   Substance and Sexual Activity  Alcohol Use No   Alcohol/week: 0.0 standard drinks of alcohol     Social  History   Substance and Sexual Activity  Drug Use No    Social History   Socioeconomic History   Marital status: Married    Spouse name: Not on file   Number of children: Not on file   Years of education: Not on file   Highest education level: Not on file  Occupational History   Not on file  Tobacco Use   Smoking status: Former    Current packs/day: 0.00    Average packs/day: 0.5 packs/day for 7.0 years (3.5 ttl pk-yrs)    Types: Cigarettes    Start date: 01/16/2008    Quit date: 01/16/2015    Years since quitting: 7.7   Smokeless tobacco: Never  Vaping Use   Vaping status: Former  Substance and Sexual Activity   Alcohol use: No    Alcohol/week: 0.0 standard drinks of alcohol   Drug use: No   Sexual activity: Yes    Birth control/protection: I.U.D., Condom    Comment: 1st intercourse- 13, partners- greater than 5 Mirena IUD 03/05/2017  Other Topics Concern   Not on file  Social History Narrative   Significant other - Rocky Crafts   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   Previous hx of substance abuse    Social Determinants of Health   Financial Resource Strain: Medium Risk (07/08/2022)   Received from [facility], [facility]   Overall Financial Resource Strain (CARDIA)    Difficulty of Paying Living Expenses: Somewhat hard  Food Insecurity: Food Insecurity Present (07/08/2022)   Received from [facility], [facility]   Hunger Vital Sign    Worried About Running Out of Food in the Last Year: Sometimes true    Ran Out of Food in the Last Year: Sometimes true  Transportation Needs: No Transportation Needs (07/08/2022)   Received from [facility], [facility]   PRAPARE - Transportation    Lack of Transportation (Medical): No    Lack of Transportation (Non-Medical): No  Physical Activity: Sufficiently Active (01/14/2021)   Received from [facility], [facility]   Exercise Vital Sign    Days of Exercise per Week: 5 days    Minutes of Exercise per Session: 30 min  Stress: Stress Concern Present (01/14/2021)   Received from [facility], [facility] - Occupational Stress Questionnaire    Feeling of Stress : Very much  Social Connections: Unknown (07/18/2021)   Received from [facility], [facility]   Social Network    Social Network: Not on file   Additional Social History:  First marriage, lives with husband, Truddie Hidden, x 9 years  She has 2 Ambulance person and 2 grandchildren   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  Adoptive father passed away from lung cancer 2022/07/20 he was an anesthesiologist Adoptive mother is living, was a Engineer, civil (consulting) and she and patient had strained relationship Patient celebrates her re-  birthday on [birth year] when she was adopted at 1 month of age    Allergies:  No Known Allergies  Labs:  Results for orders placed or performed during the hospital encounter of 10/29/22 (from the past 48  hour(s))  Comprehensive metabolic panel     Status: Abnormal   Collection Time: 10/29/22  7:17 PM  Result Value Ref Range   Sodium 139 135 - 145 mmol/L   Potassium 3.6 3.5 - 5.1 mmol/L  Chloride 107 98 - 111 mmol/L   CO2 20 (L) 22 - 32 mmol/L   Glucose, Bld 94 70 - 99 mg/dL    Comment: Glucose reference range applies only to samples taken after fasting for at least 8 hours.   BUN 13 6 - 20 mg/dL   Creatinine, Ser 1.91 0.44 - 1.00 mg/dL   Calcium 47.8 8.9 - 29.5 mg/dL   Total Protein 7.5 6.5 - 8.1 g/dL   Albumin 4.6 3.5 - 5.0 g/dL   AST 13 (L) 15 - 41 U/L   ALT 11 0 - 44 U/L   Alkaline Phosphatase 48 38 - 126 U/L   Total Bilirubin 1.8 (H) 0.3 - 1.2 mg/dL   GFR, Estimated >62 >13 mL/min    Comment: (NOTE) Calculated using the CKD-EPI Creatinine Equation (2021)    Anion gap 12 5 - 15    Comment: Performed at [facility], [address] [place], [place] [address]  Ethanol     Status: None   Collection Time: 10/29/22  7:17 PM  Result Value Ref Range   Alcohol, Ethyl (B) <10 <10 mg/dL    Comment: (NOTE) Lowest detectable limit for serum alcohol is 10 mg/dL.  For medical purposes only. Performed at [facility], [address], [place], [place] [address]   Salicylate level     Status: Abnormal   Collection Time: 10/29/22  7:17 PM  Result Value Ref Range   Salicylate Lvl <7.0 (L) 7.0 - 30.0 mg/dL    Comment: Performed at [facility], [address] [place], [place] [address]  Acetaminophen level     Status: Abnormal   Collection Time: 10/29/22  7:17 PM  Result Value Ref Range   Acetaminophen (Tylenol), Serum <10 (L) 10 - 30 ug/mL    Comment: (NOTE) Therapeutic concentrations vary significantly. A range of 10-30 ug/mL  may be an effective concentration for many patients. However, some  are best treated at concentrations outside of this range. Acetaminophen concentrations >150 ug/mL at 4 hours after ingestion   and >50 ug/mL at 12 hours after ingestion are often associated with  toxic reactions.  Performed at [facility], [address] [place], [place] [address]   cbc     Status: None   Collection Time: 10/29/22  7:17 PM  Result Value Ref Range   WBC 10.0 4.0 - 10.5 K/uL   RBC 4.52 3.87 - 5.11 MIL/uL   Hemoglobin 14.1 12.0 - 15.0 g/dL   HCT 40.1 02.7 - 25.3 %   MCV 90.9 80.0 - 100.0 fL   MCH 31.2 26.0 - 34.0 pg   MCHC 34.3 30.0 - 36.0 g/dL   RDW 66.4 40.3 - 47.4 %   Platelets 280 150 - 400 K/uL   nRBC 0.0 0.0 - 0.2 %    Comment: Performed at [facility], [address] [place], [place] [address]  Rapid urine drug screen (hospital performed)     Status: Abnormal   Collection Time: 10/29/22  7:17 PM  Result Value Ref Range   Opiates NONE DETECTED NONE DETECTED   Cocaine NONE DETECTED NONE DETECTED   Benzodiazepines POSITIVE (A) NONE DETECTED   Amphetamines POSITIVE (A) NONE DETECTED   Tetrahydrocannabinol POSITIVE (A) NONE DETECTED   Barbiturates NONE DETECTED NONE DETECTED    Comment: (NOTE) DRUG SCREEN FOR MEDICAL PURPOSES ONLY.  IF CONFIRMATION IS NEEDED FOR ANY PURPOSE, NOTIFY LAB WITHIN 5 DAYS.  LOWEST DETECTABLE LIMITS FOR URINE DRUG SCREEN Drug  Class                     Cutoff (ng/mL) Amphetamine and metabolites    1000 Barbiturate and metabolites    200 Benzodiazepine                 200 Opiates and metabolites        300 Cocaine and metabolites        300 THC                            50 Performed at [facility], [address], [place], [place] [address]   Pregnancy, urine     Status: None   Collection Time: 10/29/22  7:17 PM  Result Value Ref Range   Preg Test, Ur NEGATIVE NEGATIVE    Comment:        THE SENSITIVITY OF THIS METHODOLOGY IS >25 mIU/mL. Performed at [facility], [address], [place], [place] [address]   Lithium level     Status: Abnormal   Collection  Time: 10/29/22  9:15 PM  Result Value Ref Range   Lithium Lvl 0.34 (L) 0.60 - 1.20 mmol/L    Comment: Performed at [facility], [address], [place], [place] [address]  SARS Coronavirus 2 by RT PCR (hospital order, performed in [facility] hospital lab) *cepheid single result test* Anterior Nasal Swab     Status: None   Collection Time: 10/29/22 11:03 PM   Specimen: Anterior Nasal Swab  Result Value Ref Range   SARS Coronavirus 2 by RT PCR NEGATIVE NEGATIVE    Comment: (NOTE) SARS-CoV-2 target nucleic acids are NOT DETECTED.  The SARS-CoV-2 RNA is generally detectable in upper and lower respiratory specimens during the acute phase of infection. The lowest concentration of SARS-CoV-2 viral copies this assay can detect is 250 copies / mL. A negative result does not preclude SARS-CoV-2 infection and should not be used as the sole basis for treatment or other patient management decisions.  A negative result may occur with improper specimen collection / handling, submission of specimen other than nasopharyngeal swab, presence of viral mutation(s) within the areas targeted by this assay, and inadequate number of viral copies (<250 copies / mL). A negative result must be combined with clinical observations, patient history, and epidemiological information.  Fact Sheet for Patients:   RoadLapTop.co.za  Fact Sheet for Healthcare Providers: http://kim-miller.com/  This test is not yet approved or  cleared by the [place] FDA and has been authorized for detection and/or diagnosis of SARS-CoV-2 by FDA under an Emergency Use Authorization (EUA).  This EUA will remain in effect (meaning this test can be used) for the duration of the COVID-19 declaration under Section 564(b)(1) of the Act, 21 U.S.C. section 360bbb-3(b)(1), unless the authorization is terminated or revoked sooner.  Performed at [facility], [address] [place], [place] [address]   HIV Antibody (routine testing w rflx)     Status: None   Collection Time: 10/30/22  4:59 AM  Result Value Ref Range   HIV Screen 4th Generation wRfx Non Reactive Non Reactive    Comment: Performed at [facility], [address], [place], [place] [address]  Ferritin     Status: None   Collection Time: 10/30/22  4:59 AM  Result Value Ref Range   Ferritin 89 11 - 307 ng/mL    Comment: Performed  at [facility], [address] [place], [place] [address]    Current Facility-Administered Medications  Medication Dose Route Frequency Provider Last Rate Last Admin   acetaminophen (TYLENOL) tablet 650 mg  650 mg Oral Q6H PRN Marcelino Duster, MD   650 mg at 10/30/22 1532   enoxaparin (LOVENOX) injection 40 mg  40 mg Subcutaneous Q24H John Giovanni, MD   40 mg at 10/30/22 1016   LORazepam (ATIVAN) tablet 1-4 mg  1-4 mg Oral Q1H PRN John Giovanni, MD   2 mg at 10/30/22 1544   Or   LORazepam (ATIVAN) injection 1-4 mg  1-4 mg Intravenous Q1H PRN John Giovanni, MD   2 mg at 10/30/22 0742   OLANZapine (ZYPREXA) tablet 10 mg  10 mg Oral QHS Mariel Craft, MD   10 mg at 10/30/22 2202   OLANZapine (ZYPREXA) tablet 5 mg  5 mg Oral BID WC Mariel Craft, MD   5 mg at 10/31/22 1253   Current Outpatient Medications  Medication Sig Dispense Refill   glycopyrrolate (ROBINUL) 1 MG tablet Take 1 mg by mouth 2 (two) times daily.     metFORMIN (GLUCOPHAGE-XR) 500 MG 24 hr tablet Take 500 mg by mouth daily.     amLODipine (NORVASC) 5 MG tablet Take 5 mg by mouth daily.     azelastine (ASTELIN) 0.1 % nasal spray two sprays by Both Nostrils route 2 (two) times daily.     Biotin 40347 MCG TABS Take by mouth.     clobetasol ointment (TEMOVATE) 0.05 % Apply 1 Application topically 2 (two) times daily. 30 g 0   levonorgestrel (MIRENA) 20 MCG/24HR IUD 1 each by Intrauterine route once. Reported on 05/29/2015     LINZESS 290 MCG CAPS  capsule Take 290 mcg by mouth daily.     MYRBETRIQ 25 MG TB24 tablet Take 1 tablet by mouth once daily 30 tablet 0   OLANZapine (ZYPREXA) 10 MG tablet Take 1 tablet (10 mg total) by mouth at bedtime. 30 tablet 0   [START ON 11/01/2022] OLANZapine (ZYPREXA) 5 MG tablet Take 1 tablet (5 mg total) by mouth 2 (two) times daily with breakfast and lunch. 60 tablet 0   omeprazole (PRILOSEC) 20 MG capsule Take 20 mg by mouth daily.     rosuvastatin (CRESTOR) 10 MG tablet Take 10 mg by mouth daily.      Musculoskeletal: Strength & Muscle Tone: decreased Gait & Station: unsteady Patient leans: N/A   Psychiatric Specialty Exam:  Presentation  General Appearance: Casual; Neat  Eye Contact:Good  Speech:Clear and Coherent  Speech Volume:Normal  Handedness:Right   Mood and Affect  Mood:Euthymic (mild anxiety)  Affect:Congruent   Thought Process  Thought Processes:Coherent; Goal Directed  Descriptions of Associations:Intact  Orientation:Full (Time, Place and Person)  Thought Content:Logical  History of Schizophrenia/Schizoaffective disorder:No data recorded Duration of Psychotic Symptoms:No data recorded Hallucinations:Hallucinations: None  Ideas of Reference:None  Suicidal Thoughts:Suicidal Thoughts: No  Homicidal Thoughts:Homicidal Thoughts: No   Sensorium  Memory:Immediate Good; Recent Good; Remote Good  Judgment:Good  Insight:Good   Executive Functions  Concentration:Good  Attention Span:Good  Recall:Good  Fund of Knowledge:Good  Language:Good   Psychomotor Activity  Psychomotor Activity:Psychomotor Activity: Normal   Assets  Assets:Communication Skills; Desire for Improvement; Financial Resources/Insurance; Housing; Intimacy; Resilience; Social Support; Vocational/Educational   Sleep  Sleep:Sleep: Good   Physical Exam: Physical Exam HENT:     Head: Normocephalic.  Cardiovascular:     Pulses: Normal pulses.  Pulmonary:  Effort:  Pulmonary effort is normal. No respiratory distress.  Musculoskeletal:        General: No swelling.  Skin:    General: Skin is warm and dry.  Neurological:     General: No focal deficit present.     Mental Status: She is alert and oriented to person, place, and time.  Psychiatric:     Comments: anxious    Review of Systems  Psychiatric/Behavioral:  Positive for depression and substance abuse (prescribed stimulants and benzodiazepines). Negative for hallucinations, memory loss and suicidal ideas. The patient is nervous/anxious and has insomnia.    Blood pressure 107/66, pulse 92, temperature (!) 97 F (36.1 C), temperature source Oral, resp. rate 20, weight 73 kg, SpO2 98%. Body mass index is 26.78 kg/m.  Treatment Plan Summary: Daily contact with patient to assess and evaluate symptoms and progress in treatment and Medication management  Disposition: No evidence of imminent risk to self or others at present.     ## Psychiatric Medication Recommendations:  -- Continue Zyprexa 5 mg with breakfast and lunch and 10 mg at bedtime -- Hold other psychotropic medication at this time.  Patient is desiring to wean off of medications.     ## Medical Decision Making Capacity:  Capacity was not formally addressed during this encounter; however, the patient appeared to understand and participate in the discussion about their treatment plan at least on a superficial level.    ## Further Work-up:  -- most recent EKG 10/29/2022-QTc 437 -- Pertinent labwork reviewed earlier this admission includes: Added ferritin level for complaints of restless leg syndrome, and 89; HIV nonreactive; COVID negative; lithium levels below 0.34 (patient noted that she had missed dose prior to admission); urine pregnancy negative; urine drug screen positive for benzodiazepines, amphetamines, tetrahydrocannabinol; CBC, CMP essentially unremarkable; no elevation in Tylenol, salicylate, or ethanol; UA with reflex C&S without  growth  ## Disposition:  -- Rescind IVC, safety planning and medication education as per above   ## Behavioral / Environmental:  --  Utilize compassion and acknowledge the patient's experiences while setting clear and realistic expectations for care.   ## Safety and Observation Level:  - Based on my clinical evaluation, I estimate the patient to be at low  risk of self harm. -Safety sitter discontinued while awaiting completion of medical workup.  While future psychiatric events cannot be accurately predicted, the patient does not currently require acute inpatient psychiatric care and does not currently meet [facility] involuntary commitment criteria.  Psychiatry will sign off.  Please re-consult for any future acute psychiatric concerns.    Mariel Craft, MD 10/31/2022 5:17 PM

## 2022-10-31 NOTE — Plan of Care (Signed)

## 2022-10-31 NOTE — Discharge Summary (Addendum)
Physician Discharge Summary   Patient: Abigail Higgins MRN: [id number] DOB: 01-Mar-1981  Admit date:     10/29/2022  Discharge date: 10/31/22  Discharge Physician: Sanjeev Main   PCP: Porfirio Oar, PA   Recommendations at discharge:   Follow-up with psychiatrist as an outpatient  Discharge Diagnoses: Principal Problem:   Benzodiazepine withdrawal (HCC) Active Problems:   HLD (hyperlipidemia)   HTN (hypertension)   GERD (gastroesophageal reflux disease)   Chronic constipation   Marijuana abuse   Suicidal ideation   Hallucinations   Panic anxiety syndrome  Resolved Problems:   * No resolved hospital problems. *  Hospital Course: Abigail Higgins is a 42 y.o. female with medical history significant of hypertension, hyperlipidemia, recurrent depression requiring ECT in the past, anxiety, ADD, substance abuse, GERD, chronic constipation presented to ED due to worsening anxiety and difficulty sleeping over the past week.  She was taking extra doses of her regular prescribed Xanax and ran out 2 days early.  Symptoms have escalated with tremors, crying, and sleeplessness.  Patient endorsed extreme anxiety, auditory/visual hallucinations, and also suicidal ideation without plan.  IVC paperwork done by ED provider.  Patient was tachycardic to the 110s on arrival to the ED, later improved.  Afebrile.  Labs showing no leukocytosis, bicarb 20, glucose 94, T. bili 1.8 and no elevation of transaminases or alkaline phosphatase, blood ethanol level undetectable, salicylate level <7.0, acetaminophen level <10, lithium level 0.34, SARS-CoV-2 PCR negative.  UDS positive for benzodiazepines, amphetamines, and THC. She was admitted to [facility] for further management due to waxing and waning confusion and passive suicidal ideation in the setting of benzodiazepine withdrawal.  Patient received a total of IV Ativan 3 mg and 1 L normal saline in the ED.   History limited as patient is currently  somnolent.  She reports taking extra doses of her home Xanax to help with uncontrolled anxiety and insomnia.  She ran out of Xanax 2 days ago.  Endorsing auditory and visual hallucinations.  Patient states "I have been fantasizing of different ways of hurting myself" but not able to elaborate.  She takes Vyvanse for ADD and denies taking any extra doses of this medication.  Reports marijuana use.  No other complaints.    Assessment and Plan:  Waxing and waning confusion, auditory/visual hallucinations, and suicidal ideation in the setting of suspected benzodiazepine withdrawal, marijuana abuse, and history of recurrent depression requiring ECT in the past, anxiety, ADD Patient reported taking extra doses of her regular prescribed Xanax due to uncontrolled anxiety and insomnia.  Ran out of Xanax 2 days ago and presented with auditory and visual hallucinations.  Also endorsing suicidal ideation.  Blood ethanol level undetectable, salicylate level <7.0, acetaminophen level <10, lithium level 0.34.  UDS positive for benzodiazepines, amphetamines, and THC.   She is on Vyvanse due to history of ADD and denies taking any extra doses of this medication.  Tachycardia resolved after 1 L IV fluids.  Placed on CIWA monitoring and IV Ativan as needed.   Appreciate psych input.  She was initially placed on one-to-one safety observation and started on Zyprexa 5 mg with breakfast and lunch and 10 mg at bedtime.  Patient tolerated medication well and was able to sleep.  Symptoms are more manageable. She was seen and evaluated by psychiatry on the day of discharge and they discontinued her one-to-one safety observation and recommended that patient could be discharged home to follow-up with her psychiatrist as an outpatient. They also recommended  to discharge patient on Zyprexa 5 mg with breakfast and lunch and 10 mg at bedtime.   Hypertension Continue amlodipine   GERD Continue omeprazole   Chronic  constipation Continue Linzess       Consultants: Psychiatry Procedures performed: None  Disposition: Home Diet recommendation: Low-sodium Discharge Diet Orders (From admission, onward)     Start     Ordered   10/31/22 0000  Diet - low sodium heart healthy        10/31/22 1419           Cardiac diet DISCHARGE MEDICATION: Allergies as of 10/31/2022   No Known Allergies      Medication List     STOP taking these medications    ALPRAZolam 1 MG tablet Commonly known as: XANAX   busPIRone 30 MG tablet Commonly known as: BUSPAR   lithium carbonate 450 MG ER tablet Commonly known as: ESKALITH   sertraline 100 MG tablet Commonly known as: ZOLOFT   traZODone 100 MG tablet Commonly known as: DESYREL   Vyvanse 60 MG capsule Generic drug: lisdexamfetamine   zolpidem 10 MG tablet Commonly known as: AMBIEN       TAKE these medications    amLODipine 5 MG tablet Commonly known as: NORVASC Take 5 mg by mouth daily.   azelastine 0.1 % nasal spray Commonly known as: ASTELIN two sprays by Both Nostrils route 2 (two) times daily.   Biotin 41324 MCG Tabs Take by mouth.   clobetasol ointment 0.05 % Commonly known as: TEMOVATE Apply 1 Application topically 2 (two) times daily.   glycopyrrolate 1 MG tablet Commonly known as: ROBINUL Take 1 mg by mouth 2 (two) times daily.   levonorgestrel 20 MCG/24HR IUD Commonly known as: MIRENA 1 each by Intrauterine route once. Reported on 05/29/2015   Linzess 290 MCG Caps capsule Generic drug: linaclotide Take 290 mcg by mouth daily.   metFORMIN 500 MG 24 hr tablet Commonly known as: GLUCOPHAGE-XR Take 500 mg by mouth daily.   Myrbetriq 25 MG Tb24 tablet Generic drug: mirabegron ER Take 1 tablet by mouth once daily   OLANZapine 10 MG tablet Commonly known as: ZYPREXA Take 1 tablet (10 mg total) by mouth at bedtime.   OLANZapine 5 MG tablet Commonly known as: ZYPREXA Take 1 tablet (5 mg total) by mouth 2  (two) times daily with breakfast and lunch. Start taking on: November 01, 2022   omeprazole 20 MG capsule Commonly known as: PRILOSEC Take 20 mg by mouth daily.   rosuvastatin 10 MG tablet Commonly known as: CRESTOR Take 10 mg by mouth daily.        Discharge Exam: Filed Weights   10/29/22 1808  Weight: 73 kg   Constitutional:      General: She is not in acute distress.  Sleeping but arouses easily HENT:     Head: Normocephalic and atraumatic.  Eyes:     Extraocular Movements: Extraocular movements intact.  Cardiovascular:     Rate and Rhythm: Normal rate and regular rhythm.     Pulses: Normal pulses.  Pulmonary:     Effort: Pulmonary effort is normal. No respiratory distress.     Breath sounds: Normal breath sounds. No wheezing or rales.  Abdominal:     General: Bowel sounds are normal. There is no distension.     Palpations: Abdomen is soft.     Tenderness: There is no abdominal tenderness.  Musculoskeletal:     Cervical back: Normal range of motion.  Right lower leg: No edema.     Left lower leg: No edema.  Skin:    General: Skin is warm and dry.  Neurological:     General: No focal deficit present.     Mental Status: Sleeping but arouses easily.  Awake, alert and oriented to person place and time while awake.    Cranial Nerves: No cranial nerve deficit.     Sensory: No sensory deficit.     Motor: No weakness.     Condition at discharge: stable  The results of significant diagnostics from this hospitalization (including imaging, microbiology, ancillary and laboratory) are listed below for reference.   Imaging Studies: No results found.  Microbiology: Results for orders placed or performed during the hospital encounter of 10/29/22  SARS Coronavirus 2 by RT PCR (hospital order, performed in [facility] hospital lab) *cepheid single result test* Anterior Nasal Swab     Status: None   Collection Time: 10/29/22 11:03 PM   Specimen: Anterior Nasal Swab   Result Value Ref Range Status   SARS Coronavirus 2 by RT PCR NEGATIVE NEGATIVE Final    Comment: (NOTE) SARS-CoV-2 target nucleic acids are NOT DETECTED.  The SARS-CoV-2 RNA is generally detectable in upper and lower respiratory specimens during the acute phase of infection. The lowest concentration of SARS-CoV-2 viral copies this assay can detect is 250 copies / mL. A negative result does not preclude SARS-CoV-2 infection and should not be used as the sole basis for treatment or other patient management decisions.  A negative result may occur with improper specimen collection / handling, submission of specimen other than nasopharyngeal swab, presence of viral mutation(s) within the areas targeted by this assay, and inadequate number of viral copies (<250 copies / mL). A negative result must be combined with clinical observations, patient history, and epidemiological information.  Fact Sheet for Patients:   RoadLapTop.co.za  Fact Sheet for Healthcare Providers: http://kim-miller.com/  This test is not yet approved or  cleared by the [place] FDA and has been authorized for detection and/or diagnosis of SARS-CoV-2 by FDA under an Emergency Use Authorization (EUA).  This EUA will remain in effect (meaning this test can be used) for the duration of the COVID-19 declaration under Section 564(b)(1) of the Act, 21 U.S.C. section 360bbb-3(b)(1), unless the authorization is terminated or revoked sooner.  Performed at [facility], [address] [place], [place] [address]     Labs: CBC: Recent Labs  Lab 10/29/22 1917  WBC 10.0  HGB 14.1  HCT 41.1  MCV 90.9  PLT 280   Basic Metabolic Panel: Recent Labs  Lab 10/29/22 1917  NA 139  K 3.6  CL 107  CO2 20*  GLUCOSE 94  BUN 13  CREATININE 0.69  CALCIUM 10.0   Liver Function Tests: Recent Labs  Lab 10/29/22 1917  AST 13*  ALT 11  ALKPHOS 48   BILITOT 1.8*  PROT 7.5  ALBUMIN 4.6   CBG: No results for input(s): "GLUCAP" in the last 168 hours.  Discharge time spent: greater than 30 minutes.  Signed: Lucile Shutters, MD [facility] 10/31/2022

## 2022-10-31 NOTE — TOC Progression Note (Signed)
Transition of Care [facility]) - Initial/Assessment Note    Patient Details  Name: Abigail Higgins MRN: [id number] Date of Birth: 24-Jun-1980  Transition of Care [facility]) CM/SW Contact:    Ralene Bathe, LCSW Phone Number: 10/31/2022, 12:05 PM  Clinical Narrative:                 LCSW submitted Notice of Commitment Change to [facility] to rescind IVC.  Envelope#[id number]  TOC following.   Patient Goals and CMS Choice            Expected Discharge Plan and Services                                              Prior Living Arrangements/Services                       Activities of Daily Living      Permission Sought/Granted                  Emotional Assessment              Admission diagnosis:  Suicidal ideation [R45.851] Panic anxiety syndrome [F41.0] Benzodiazepine withdrawal (HCC) [F13.939] Benzodiazepine withdrawal with perceptual disturbance (HCC) [F13.932] Patient Active Problem List   Diagnosis Date Noted   HTN (hypertension) 10/30/2022   GERD (gastroesophageal reflux disease) 10/30/2022   Chronic constipation 10/30/2022   Marijuana abuse 10/30/2022   Suicidal ideation 10/30/2022   Hallucinations 10/30/2022   Panic anxiety syndrome 10/30/2022   Benzodiazepine withdrawal (HCC) 10/29/2022   Vitamin D deficiency 09/23/2020   Unipolar affective disorder, depressive 02/20/2020   Tachycardia 01/03/2017   Migraine 01/24/2016   Alcoholism in remission (HCC) 11/30/2014   History of alcoholism (HCC) 11/30/2014   Eustachian tube dysfunction 09/29/2014   HLD (hyperlipidemia) 06/26/2014   Cigarette smoker one half pack a day or less 06/01/2014   Former smoker 06/01/2014   Rash 03/14/2013   Other malaise and fatigue 03/14/2013   IC (interstitial cystitis) 12/15/2012   Adopted 05/18/2012   Weight gain 05/18/2012   Bipolar I disorder (HCC)    CIN I (cervical intraepithelial neoplasia I)    PCP:  Porfirio Oar,  PA Pharmacy:   [facility] [address] [place] - [address] [place] [address] Phone: [phone] Fax: [phone]     Social Determinants of Health (SDOH) Social History: SDOH Screenings   Food Insecurity: Food Insecurity Present (07/08/2022)   Received from [facility]  Transportation Needs: No Transportation Needs (07/08/2022)   Received from [facility], [facility]  Utilities: At Risk (07/08/2022)   Received from [facility], [facility]  Financial Resource Strain: Medium Risk (07/08/2022)   Received from [facility], [facility]  Physical Activity: Sufficiently Active (01/14/2021)   Received from [facility], [facility]  Social Connections: Unknown (07/18/2021)   Received from [facility], [facility]  Stress: Stress Concern Present (01/14/2021)   Received from [facility], [facility]  Tobacco Use: Medium Risk (10/29/2022)   SDOH Interventions:     Readmission Risk Interventions     No data to display

## 2022-10-31 NOTE — Progress Notes (Signed)
DISCHARGE NOTE HOME Abigail Higgins to be discharged Home per MD order. Discussed prescriptions and follow up appointments with the patient. Prescriptions given to patient; medication list explained in detail. Patient verbalized understanding.  Skin clean, dry and intact without evidence of skin break down, no evidence of skin tears noted. IV catheter discontinued intact. Site without signs and symptoms of complications. Dressing and pressure applied. Pt denies pain at the site currently. No complaints noted.  Patient free of lines, drains, and wounds.   An After Visit Summary (AVS) was printed and given to the patient. Patient escorted via wheelchair, and discharged home via private auto. Picked up by husband Armanda Magic, RN

## 2022-11-03 ENCOUNTER — Telehealth (HOSPITAL_COMMUNITY): Payer: Self-pay | Admitting: Pharmacy Technician

## 2022-11-03 ENCOUNTER — Other Ambulatory Visit (HOSPITAL_COMMUNITY): Payer: Self-pay

## 2022-11-03 DIAGNOSIS — F603 Borderline personality disorder: Secondary | ICD-10-CM | POA: Diagnosis not present

## 2022-11-03 DIAGNOSIS — F410 Panic disorder [episodic paroxysmal anxiety] without agoraphobia: Secondary | ICD-10-CM | POA: Diagnosis not present

## 2022-11-03 DIAGNOSIS — F5101 Primary insomnia: Secondary | ICD-10-CM | POA: Diagnosis not present

## 2022-11-03 DIAGNOSIS — F332 Major depressive disorder, recurrent severe without psychotic features: Secondary | ICD-10-CM | POA: Diagnosis not present

## 2022-11-03 DIAGNOSIS — F900 Attention-deficit hyperactivity disorder, predominantly inattentive type: Secondary | ICD-10-CM | POA: Diagnosis not present

## 2022-11-03 NOTE — Telephone Encounter (Signed)
Pharmacy Patient Advocate Encounter  Received notification from [facility] that Prior Authorization for OLANZapine 5MG  tablets  has been APPROVED from 11/03/2022 to 11/03/2023   PA #/Case ID/Reference #: [id number]

## 2022-11-03 NOTE — Telephone Encounter (Signed)
Pharmacy Patient Advocate Encounter   Received notification from Fax that prior authorization for OLANZapine 5MG  tablets is required/requested.   Insurance verification completed.   The patient is insured through [facility] .   Per test claim: PA required; PA submitted to [employer] via CoverMyMeds Key/confirmation #/EOC [facility] Status is pending

## 2022-11-10 ENCOUNTER — Other Ambulatory Visit: Payer: Self-pay

## 2022-11-10 DIAGNOSIS — F603 Borderline personality disorder: Secondary | ICD-10-CM | POA: Diagnosis not present

## 2022-11-10 DIAGNOSIS — N3281 Overactive bladder: Secondary | ICD-10-CM

## 2022-11-10 NOTE — Telephone Encounter (Signed)
Pt LVM in triage line stating she has now scheduled her appt and is requesting refills on her myrbetriq prescription.  Med refill request: Last AEX: 03/05/2021 Next AEX: 02/18/2023 Refill authorized: Rx pend.  Last seen on 08/19/2022.

## 2022-11-11 DIAGNOSIS — F331 Major depressive disorder, recurrent, moderate: Secondary | ICD-10-CM | POA: Diagnosis not present

## 2022-11-11 DIAGNOSIS — F900 Attention-deficit hyperactivity disorder, predominantly inattentive type: Secondary | ICD-10-CM | POA: Diagnosis not present

## 2022-11-11 DIAGNOSIS — F410 Panic disorder [episodic paroxysmal anxiety] without agoraphobia: Secondary | ICD-10-CM | POA: Diagnosis not present

## 2022-11-11 MED ORDER — MIRABEGRON ER 25 MG PO TB24
25.0000 mg | ORAL_TABLET | Freq: Every day | ORAL | 0 refills | Status: DC
Start: 2022-11-11 — End: 2023-02-18

## 2022-11-11 NOTE — Telephone Encounter (Signed)
LDVM on machine per DPR, notifying pt that refill was sent.

## 2022-11-13 DIAGNOSIS — F603 Borderline personality disorder: Secondary | ICD-10-CM | POA: Diagnosis not present

## 2022-11-18 DIAGNOSIS — F603 Borderline personality disorder: Secondary | ICD-10-CM | POA: Diagnosis not present

## 2022-11-20 DIAGNOSIS — L301 Dyshidrosis [pompholyx]: Secondary | ICD-10-CM | POA: Diagnosis not present

## 2022-11-24 DIAGNOSIS — F1021 Alcohol dependence, in remission: Secondary | ICD-10-CM | POA: Diagnosis not present

## 2022-11-24 DIAGNOSIS — Z0000 Encounter for general adult medical examination without abnormal findings: Secondary | ICD-10-CM | POA: Diagnosis not present

## 2022-11-24 DIAGNOSIS — R45851 Suicidal ideations: Secondary | ICD-10-CM | POA: Diagnosis not present

## 2022-11-24 DIAGNOSIS — T50905A Adverse effect of unspecified drugs, medicaments and biological substances, initial encounter: Secondary | ICD-10-CM | POA: Diagnosis not present

## 2022-11-24 DIAGNOSIS — R635 Abnormal weight gain: Secondary | ICD-10-CM | POA: Diagnosis not present

## 2022-11-24 DIAGNOSIS — Z1330 Encounter for screening examination for mental health and behavioral disorders, unspecified: Secondary | ICD-10-CM | POA: Diagnosis not present

## 2022-11-24 DIAGNOSIS — Z23 Encounter for immunization: Secondary | ICD-10-CM | POA: Diagnosis not present

## 2022-11-24 DIAGNOSIS — F603 Borderline personality disorder: Secondary | ICD-10-CM | POA: Diagnosis not present

## 2022-11-24 DIAGNOSIS — F419 Anxiety disorder, unspecified: Secondary | ICD-10-CM | POA: Diagnosis not present

## 2022-11-27 DIAGNOSIS — F603 Borderline personality disorder: Secondary | ICD-10-CM | POA: Diagnosis not present

## 2022-12-03 DIAGNOSIS — F410 Panic disorder [episodic paroxysmal anxiety] without agoraphobia: Secondary | ICD-10-CM | POA: Diagnosis not present

## 2022-12-03 DIAGNOSIS — F5101 Primary insomnia: Secondary | ICD-10-CM | POA: Diagnosis not present

## 2022-12-03 DIAGNOSIS — F900 Attention-deficit hyperactivity disorder, predominantly inattentive type: Secondary | ICD-10-CM | POA: Diagnosis not present

## 2022-12-03 DIAGNOSIS — F332 Major depressive disorder, recurrent severe without psychotic features: Secondary | ICD-10-CM | POA: Diagnosis not present

## 2022-12-05 DIAGNOSIS — F332 Major depressive disorder, recurrent severe without psychotic features: Secondary | ICD-10-CM | POA: Diagnosis not present

## 2022-12-09 DIAGNOSIS — F332 Major depressive disorder, recurrent severe without psychotic features: Secondary | ICD-10-CM | POA: Diagnosis not present

## 2022-12-10 DIAGNOSIS — F332 Major depressive disorder, recurrent severe without psychotic features: Secondary | ICD-10-CM | POA: Diagnosis not present

## 2022-12-15 DIAGNOSIS — F332 Major depressive disorder, recurrent severe without psychotic features: Secondary | ICD-10-CM | POA: Diagnosis not present

## 2022-12-16 DIAGNOSIS — F603 Borderline personality disorder: Secondary | ICD-10-CM | POA: Diagnosis not present

## 2022-12-17 DIAGNOSIS — F332 Major depressive disorder, recurrent severe without psychotic features: Secondary | ICD-10-CM | POA: Diagnosis not present

## 2022-12-23 DIAGNOSIS — F332 Major depressive disorder, recurrent severe without psychotic features: Secondary | ICD-10-CM | POA: Diagnosis not present

## 2022-12-23 DIAGNOSIS — F603 Borderline personality disorder: Secondary | ICD-10-CM | POA: Diagnosis not present

## 2022-12-25 DIAGNOSIS — F332 Major depressive disorder, recurrent severe without psychotic features: Secondary | ICD-10-CM | POA: Diagnosis not present

## 2022-12-26 DIAGNOSIS — F603 Borderline personality disorder: Secondary | ICD-10-CM | POA: Diagnosis not present

## 2022-12-29 DIAGNOSIS — F332 Major depressive disorder, recurrent severe without psychotic features: Secondary | ICD-10-CM | POA: Diagnosis not present

## 2022-12-29 DIAGNOSIS — F603 Borderline personality disorder: Secondary | ICD-10-CM | POA: Diagnosis not present

## 2023-01-01 DIAGNOSIS — F603 Borderline personality disorder: Secondary | ICD-10-CM | POA: Diagnosis not present

## 2023-01-01 DIAGNOSIS — F332 Major depressive disorder, recurrent severe without psychotic features: Secondary | ICD-10-CM | POA: Diagnosis not present

## 2023-01-05 DIAGNOSIS — F332 Major depressive disorder, recurrent severe without psychotic features: Secondary | ICD-10-CM | POA: Diagnosis not present

## 2023-01-06 DIAGNOSIS — F603 Borderline personality disorder: Secondary | ICD-10-CM | POA: Diagnosis not present

## 2023-01-08 DIAGNOSIS — F332 Major depressive disorder, recurrent severe without psychotic features: Secondary | ICD-10-CM | POA: Diagnosis not present

## 2023-01-13 DIAGNOSIS — F332 Major depressive disorder, recurrent severe without psychotic features: Secondary | ICD-10-CM | POA: Diagnosis not present

## 2023-01-16 DIAGNOSIS — F332 Major depressive disorder, recurrent severe without psychotic features: Secondary | ICD-10-CM | POA: Diagnosis not present

## 2023-01-19 DIAGNOSIS — F332 Major depressive disorder, recurrent severe without psychotic features: Secondary | ICD-10-CM | POA: Diagnosis not present

## 2023-01-21 DIAGNOSIS — F332 Major depressive disorder, recurrent severe without psychotic features: Secondary | ICD-10-CM | POA: Diagnosis not present

## 2023-01-26 DIAGNOSIS — F603 Borderline personality disorder: Secondary | ICD-10-CM | POA: Diagnosis not present

## 2023-01-27 DIAGNOSIS — F332 Major depressive disorder, recurrent severe without psychotic features: Secondary | ICD-10-CM | POA: Diagnosis not present

## 2023-01-29 DIAGNOSIS — F603 Borderline personality disorder: Secondary | ICD-10-CM | POA: Diagnosis not present

## 2023-01-30 DIAGNOSIS — F332 Major depressive disorder, recurrent severe without psychotic features: Secondary | ICD-10-CM | POA: Diagnosis not present

## 2023-02-02 DIAGNOSIS — F332 Major depressive disorder, recurrent severe without psychotic features: Secondary | ICD-10-CM | POA: Diagnosis not present

## 2023-02-02 DIAGNOSIS — F603 Borderline personality disorder: Secondary | ICD-10-CM | POA: Diagnosis not present

## 2023-02-05 DIAGNOSIS — F603 Borderline personality disorder: Secondary | ICD-10-CM | POA: Diagnosis not present

## 2023-02-09 DIAGNOSIS — F603 Borderline personality disorder: Secondary | ICD-10-CM | POA: Diagnosis not present

## 2023-02-10 DIAGNOSIS — F332 Major depressive disorder, recurrent severe without psychotic features: Secondary | ICD-10-CM | POA: Diagnosis not present

## 2023-02-11 DIAGNOSIS — F900 Attention-deficit hyperactivity disorder, predominantly inattentive type: Secondary | ICD-10-CM | POA: Diagnosis not present

## 2023-02-11 DIAGNOSIS — F5101 Primary insomnia: Secondary | ICD-10-CM | POA: Diagnosis not present

## 2023-02-11 DIAGNOSIS — F410 Panic disorder [episodic paroxysmal anxiety] without agoraphobia: Secondary | ICD-10-CM | POA: Diagnosis not present

## 2023-02-11 DIAGNOSIS — F3341 Major depressive disorder, recurrent, in partial remission: Secondary | ICD-10-CM | POA: Diagnosis not present

## 2023-02-17 DIAGNOSIS — F603 Borderline personality disorder: Secondary | ICD-10-CM | POA: Diagnosis not present

## 2023-02-18 ENCOUNTER — Encounter: Payer: Self-pay | Admitting: Nurse Practitioner

## 2023-02-18 ENCOUNTER — Ambulatory Visit (INDEPENDENT_AMBULATORY_CARE_PROVIDER_SITE_OTHER): Payer: BC Managed Care – PPO | Admitting: Nurse Practitioner

## 2023-02-18 VITALS — BP 132/82 | HR 74 | Ht 65.0 in | Wt 182.0 lb

## 2023-02-18 DIAGNOSIS — Z01419 Encounter for gynecological examination (general) (routine) without abnormal findings: Secondary | ICD-10-CM | POA: Diagnosis not present

## 2023-02-18 DIAGNOSIS — Z1331 Encounter for screening for depression: Secondary | ICD-10-CM

## 2023-02-18 DIAGNOSIS — N3281 Overactive bladder: Secondary | ICD-10-CM

## 2023-02-18 DIAGNOSIS — F332 Major depressive disorder, recurrent severe without psychotic features: Secondary | ICD-10-CM | POA: Diagnosis not present

## 2023-02-18 DIAGNOSIS — Z30431 Encounter for routine checking of intrauterine contraceptive device: Secondary | ICD-10-CM

## 2023-02-18 MED ORDER — MIRABEGRON ER 25 MG PO TB24
25.0000 mg | ORAL_TABLET | Freq: Every day | ORAL | 3 refills | Status: DC
Start: 2023-02-18 — End: 2023-04-21

## 2023-02-18 NOTE — Progress Notes (Signed)
Abigail Higgins 08/16/1980 [id number]   History:  42 y.o. G0 presents for annual exam. Mirena IUD inserted 02/2017, amenorrheic. History of CIN-1 years ago. Depression/anxiety, bipolar disorder managed by psychiatry. Mental health has not been good lately. Has really been focusing on this. Quit her job and is working on disability now. Sees therapy. HTN managed by PCP, GI managing constipation, on Linzess. Was doing well on Myrbetriq for OAB with good management but ran out a few months ago and would like to restart.   Gynecologic History No LMP recorded. (Menstrual status: IUD).   Contraception: IUD Sexually active: Yes  Health maintenance Last Pap: 02/16/2019. Results were: Normal neg HPV, 5-year repeat Last mammogram: 11/06/2020. Results were: Possible asymmetry in both breasts, normal follow up imaging Last Colonoscopy: 2024. Results were: Normal Last Dexa: Not indicated  Past medical history, past surgical history, family history and social history were all reviewed and documented in the EPIC chart. Married. Working on disability. Has stepdaughter in [place] who has 6 and 85 yo girls.   ROS:  A ROS was performed and pertinent positives and negatives are included.  Exam:  Vitals:   02/18/23 1447  BP: 132/82  Pulse: 74  SpO2: 100%  Weight: 182 lb (82.6 kg)  Height: 5\' 5"  (1.651 m)     Body mass index is 30.29 kg/m.  General appearance:  Normal Thyroid:  Symmetrical, normal in size, without palpable masses or nodularity. Respiratory  Auscultation:  Clear without wheezing or rhonchi Cardiovascular  Auscultation:  Regular rate, without rubs, murmurs or gallops  Edema/varicosities:  Not grossly evident Abdominal  Soft,nontender, without masses, guarding or rebound.  Liver/spleen:  No organomegaly noted  Hernia:  None appreciated  Skin  Inspection:  Grossly normal   Breasts: Examined lying and sitting.   Right: Without masses, retractions, discharge or axillary  adenopathy.   Left: Without masses, retractions, discharge or axillary adenopathy. Pelvic: External genitalia:  no lesions              Urethra:  normal appearing urethra with no masses, tenderness or lesions              Bartholins and Skenes: normal                 Vagina: normal appearing vagina with normal color and discharge, no lesions              Cervix: no lesions. IUD string visible Bimanual Exam:  Uterus:  no masses or tenderness              Adnexa: no mass, fullness, tenderness              Rectovaginal: Deferred              Anus:  normal, no lesions  Patient informed chaperone available to be present for breast and pelvic exam. Patient has requested no chaperone to be present. Patient has been advised what will be completed during breast and pelvic exam.   Assessment/Plan:  42 y.o. G0 for annual exam.   Well female exam with routine gynecological exam - Education provided on SBEs, importance of preventative screenings, current guidelines, high calcium diet, regular exercise, and multivitamin daily. Labs with PCP.   Encounter for routine checking of intrauterine contraceptive device (IUD) - Mirena inserted 02/2017, amenorrheic. She is aware for 8-year FDA approval. Will check hormone levels next fall prior to expiration.   Positive screening for depression on 9-item Patient Health Questionnaire (PHQ-9).  PHQ-9 of 25. Depression and anxiety managed by psychiatry, also sees therapist. No thoughts of self harm.   OAB (overactive bladder) - Plan: mirabegron ER (MYRBETRIQ) 25 MG TB24 tablet daily. Good management.   Screening for cervical cancer - CIN-1 years ago, normal paps since. Will repeat pap at 5-year interval per guidelines.   Screening for breast cancer - Normal mammogram history.  Overdue and encouraged to schedule. Normal breast exam today.  Screening for colon cancer - Colonoscopy this year. Will repeat at GI's recommended interval.   Return in about 1 year (around  02/18/2024) for Annual.       Olivia Mackie [facility], 3:05 PM 02/18/2023

## 2023-02-19 DIAGNOSIS — F603 Borderline personality disorder: Secondary | ICD-10-CM | POA: Diagnosis not present

## 2023-02-20 DIAGNOSIS — F332 Major depressive disorder, recurrent severe without psychotic features: Secondary | ICD-10-CM | POA: Diagnosis not present

## 2023-02-23 DIAGNOSIS — F332 Major depressive disorder, recurrent severe without psychotic features: Secondary | ICD-10-CM | POA: Diagnosis not present

## 2023-02-25 DIAGNOSIS — F603 Borderline personality disorder: Secondary | ICD-10-CM | POA: Diagnosis not present

## 2023-02-27 DIAGNOSIS — F332 Major depressive disorder, recurrent severe without psychotic features: Secondary | ICD-10-CM | POA: Diagnosis not present

## 2023-03-03 DIAGNOSIS — F332 Major depressive disorder, recurrent severe without psychotic features: Secondary | ICD-10-CM | POA: Diagnosis not present

## 2023-03-06 DIAGNOSIS — F332 Major depressive disorder, recurrent severe without psychotic features: Secondary | ICD-10-CM | POA: Diagnosis not present

## 2023-03-12 DIAGNOSIS — F603 Borderline personality disorder: Secondary | ICD-10-CM | POA: Diagnosis not present

## 2023-03-19 DIAGNOSIS — F603 Borderline personality disorder: Secondary | ICD-10-CM | POA: Diagnosis not present

## 2023-03-23 DIAGNOSIS — F332 Major depressive disorder, recurrent severe without psychotic features: Secondary | ICD-10-CM | POA: Diagnosis not present

## 2023-03-24 DIAGNOSIS — F603 Borderline personality disorder: Secondary | ICD-10-CM | POA: Diagnosis not present

## 2023-03-26 DIAGNOSIS — F449 Dissociative and conversion disorder, unspecified: Secondary | ICD-10-CM | POA: Diagnosis not present

## 2023-03-26 DIAGNOSIS — F4312 Post-traumatic stress disorder, chronic: Secondary | ICD-10-CM | POA: Diagnosis not present

## 2023-03-27 DIAGNOSIS — F332 Major depressive disorder, recurrent severe without psychotic features: Secondary | ICD-10-CM | POA: Diagnosis not present

## 2023-03-30 DIAGNOSIS — F4312 Post-traumatic stress disorder, chronic: Secondary | ICD-10-CM | POA: Diagnosis not present

## 2023-03-30 DIAGNOSIS — F449 Dissociative and conversion disorder, unspecified: Secondary | ICD-10-CM | POA: Diagnosis not present

## 2023-03-31 DIAGNOSIS — H04123 Dry eye syndrome of bilateral lacrimal glands: Secondary | ICD-10-CM | POA: Diagnosis not present

## 2023-03-31 DIAGNOSIS — H5213 Myopia, bilateral: Secondary | ICD-10-CM | POA: Diagnosis not present

## 2023-04-01 DIAGNOSIS — F332 Major depressive disorder, recurrent severe without psychotic features: Secondary | ICD-10-CM | POA: Diagnosis not present

## 2023-04-02 DIAGNOSIS — F603 Borderline personality disorder: Secondary | ICD-10-CM | POA: Diagnosis not present

## 2023-04-02 DIAGNOSIS — F449 Dissociative and conversion disorder, unspecified: Secondary | ICD-10-CM | POA: Diagnosis not present

## 2023-04-02 DIAGNOSIS — F4312 Post-traumatic stress disorder, chronic: Secondary | ICD-10-CM | POA: Diagnosis not present

## 2023-04-03 DIAGNOSIS — F332 Major depressive disorder, recurrent severe without psychotic features: Secondary | ICD-10-CM | POA: Diagnosis not present

## 2023-04-07 DIAGNOSIS — F449 Dissociative and conversion disorder, unspecified: Secondary | ICD-10-CM | POA: Diagnosis not present

## 2023-04-07 DIAGNOSIS — F4312 Post-traumatic stress disorder, chronic: Secondary | ICD-10-CM | POA: Diagnosis not present

## 2023-04-10 DIAGNOSIS — F332 Major depressive disorder, recurrent severe without psychotic features: Secondary | ICD-10-CM | POA: Diagnosis not present

## 2023-04-13 DIAGNOSIS — F332 Major depressive disorder, recurrent severe without psychotic features: Secondary | ICD-10-CM | POA: Diagnosis not present

## 2023-04-14 DIAGNOSIS — F4312 Post-traumatic stress disorder, chronic: Secondary | ICD-10-CM | POA: Diagnosis not present

## 2023-04-14 DIAGNOSIS — F449 Dissociative and conversion disorder, unspecified: Secondary | ICD-10-CM | POA: Diagnosis not present

## 2023-04-15 DIAGNOSIS — F332 Major depressive disorder, recurrent severe without psychotic features: Secondary | ICD-10-CM | POA: Diagnosis not present

## 2023-04-16 DIAGNOSIS — F449 Dissociative and conversion disorder, unspecified: Secondary | ICD-10-CM | POA: Diagnosis not present

## 2023-04-16 DIAGNOSIS — F4312 Post-traumatic stress disorder, chronic: Secondary | ICD-10-CM | POA: Diagnosis not present

## 2023-04-21 ENCOUNTER — Other Ambulatory Visit: Payer: Self-pay | Admitting: Nurse Practitioner

## 2023-04-21 ENCOUNTER — Telehealth: Payer: Self-pay | Admitting: *Deleted

## 2023-04-21 DIAGNOSIS — F4312 Post-traumatic stress disorder, chronic: Secondary | ICD-10-CM | POA: Diagnosis not present

## 2023-04-21 DIAGNOSIS — F449 Dissociative and conversion disorder, unspecified: Secondary | ICD-10-CM | POA: Diagnosis not present

## 2023-04-21 DIAGNOSIS — N3281 Overactive bladder: Secondary | ICD-10-CM

## 2023-04-21 MED ORDER — MIRABEGRON ER 50 MG PO TB24
50.0000 mg | ORAL_TABLET | Freq: Every day | ORAL | 1 refills | Status: DC
Start: 2023-04-21 — End: 2023-06-15

## 2023-04-21 NOTE — Telephone Encounter (Signed)
Patient left message requesting to increase dosage of Myrbetriq, currently on 25 mg daily, not effective. States she was advised to call if increase needed.   AEX 02/18/23

## 2023-04-21 NOTE — Telephone Encounter (Signed)
 Patient notified. Encounter closed

## 2023-04-21 NOTE — Telephone Encounter (Signed)
Myrbetriq 50 mg sent to pharmacy. If no improvement on increased dose urology recommended.

## 2023-04-21 NOTE — Telephone Encounter (Signed)
 Spoke with patient. Patient reports symptoms did not improve at all on Mybetriq. Reports continued urinary incontinence, since starting medication in 02/2023. Was on higher dose in the past, was on higher dose in the past, was effective.   Advised will update Tiffany and return call with recommendations. Pharmacy on file confirmed.    Tiffany -please review and advise.

## 2023-04-23 DIAGNOSIS — F603 Borderline personality disorder: Secondary | ICD-10-CM | POA: Diagnosis not present

## 2023-04-23 DIAGNOSIS — F4312 Post-traumatic stress disorder, chronic: Secondary | ICD-10-CM | POA: Diagnosis not present

## 2023-04-23 DIAGNOSIS — F449 Dissociative and conversion disorder, unspecified: Secondary | ICD-10-CM | POA: Diagnosis not present

## 2023-04-27 DIAGNOSIS — F332 Major depressive disorder, recurrent severe without psychotic features: Secondary | ICD-10-CM | POA: Diagnosis not present

## 2023-04-27 DIAGNOSIS — F4312 Post-traumatic stress disorder, chronic: Secondary | ICD-10-CM | POA: Diagnosis not present

## 2023-04-27 DIAGNOSIS — F449 Dissociative and conversion disorder, unspecified: Secondary | ICD-10-CM | POA: Diagnosis not present

## 2023-04-29 ENCOUNTER — Other Ambulatory Visit: Payer: Self-pay | Admitting: Medical Genetics

## 2023-04-30 DIAGNOSIS — F449 Dissociative and conversion disorder, unspecified: Secondary | ICD-10-CM | POA: Diagnosis not present

## 2023-04-30 DIAGNOSIS — F4312 Post-traumatic stress disorder, chronic: Secondary | ICD-10-CM | POA: Diagnosis not present

## 2023-04-30 DIAGNOSIS — F603 Borderline personality disorder: Secondary | ICD-10-CM | POA: Diagnosis not present

## 2023-05-01 DIAGNOSIS — F332 Major depressive disorder, recurrent severe without psychotic features: Secondary | ICD-10-CM | POA: Diagnosis not present

## 2023-05-05 DIAGNOSIS — F332 Major depressive disorder, recurrent severe without psychotic features: Secondary | ICD-10-CM | POA: Diagnosis not present

## 2023-05-06 ENCOUNTER — Other Ambulatory Visit (HOSPITAL_COMMUNITY): Payer: Self-pay

## 2023-05-07 DIAGNOSIS — F410 Panic disorder [episodic paroxysmal anxiety] without agoraphobia: Secondary | ICD-10-CM | POA: Diagnosis not present

## 2023-05-07 DIAGNOSIS — F449 Dissociative and conversion disorder, unspecified: Secondary | ICD-10-CM | POA: Diagnosis not present

## 2023-05-07 DIAGNOSIS — F900 Attention-deficit hyperactivity disorder, predominantly inattentive type: Secondary | ICD-10-CM | POA: Diagnosis not present

## 2023-05-07 DIAGNOSIS — F4312 Post-traumatic stress disorder, chronic: Secondary | ICD-10-CM | POA: Diagnosis not present

## 2023-05-07 DIAGNOSIS — F331 Major depressive disorder, recurrent, moderate: Secondary | ICD-10-CM | POA: Diagnosis not present

## 2023-05-11 DIAGNOSIS — F603 Borderline personality disorder: Secondary | ICD-10-CM | POA: Diagnosis not present

## 2023-05-11 DIAGNOSIS — F449 Dissociative and conversion disorder, unspecified: Secondary | ICD-10-CM | POA: Diagnosis not present

## 2023-05-11 DIAGNOSIS — F4312 Post-traumatic stress disorder, chronic: Secondary | ICD-10-CM | POA: Diagnosis not present

## 2023-05-13 ENCOUNTER — Other Ambulatory Visit: Payer: BC Managed Care – PPO

## 2023-05-13 DIAGNOSIS — Z006 Encounter for examination for normal comparison and control in clinical research program: Secondary | ICD-10-CM

## 2023-05-14 DIAGNOSIS — F603 Borderline personality disorder: Secondary | ICD-10-CM | POA: Diagnosis not present

## 2023-05-14 DIAGNOSIS — F4312 Post-traumatic stress disorder, chronic: Secondary | ICD-10-CM | POA: Diagnosis not present

## 2023-05-14 DIAGNOSIS — F449 Dissociative and conversion disorder, unspecified: Secondary | ICD-10-CM | POA: Diagnosis not present

## 2023-05-24 LAB — GENECONNECT MOLECULAR SCREEN: Genetic Analysis Overall Interpretation: NEGATIVE

## 2023-05-25 DIAGNOSIS — F449 Dissociative and conversion disorder, unspecified: Secondary | ICD-10-CM | POA: Diagnosis not present

## 2023-05-25 DIAGNOSIS — F4312 Post-traumatic stress disorder, chronic: Secondary | ICD-10-CM | POA: Diagnosis not present

## 2023-05-28 DIAGNOSIS — F449 Dissociative and conversion disorder, unspecified: Secondary | ICD-10-CM | POA: Diagnosis not present

## 2023-05-28 DIAGNOSIS — F603 Borderline personality disorder: Secondary | ICD-10-CM | POA: Diagnosis not present

## 2023-05-28 DIAGNOSIS — L209 Atopic dermatitis, unspecified: Secondary | ICD-10-CM | POA: Diagnosis not present

## 2023-05-28 DIAGNOSIS — F4312 Post-traumatic stress disorder, chronic: Secondary | ICD-10-CM | POA: Diagnosis not present

## 2023-06-01 ENCOUNTER — Ambulatory Visit: Admitting: Nurse Practitioner

## 2023-06-01 ENCOUNTER — Encounter: Payer: Self-pay | Admitting: Nurse Practitioner

## 2023-06-01 VITALS — BP 126/82 | HR 110

## 2023-06-01 DIAGNOSIS — R300 Dysuria: Secondary | ICD-10-CM

## 2023-06-01 DIAGNOSIS — N3001 Acute cystitis with hematuria: Secondary | ICD-10-CM

## 2023-06-01 MED ORDER — NITROFURANTOIN MONOHYD MACRO 100 MG PO CAPS: 100.0000 mg | ORAL_CAPSULE | Freq: Two times a day (BID) | ORAL | 0 refills | Status: DC

## 2023-06-01 NOTE — Progress Notes (Signed)
   Acute Office Visit  Subjective:    Patient ID: Abigail Higgins, female    DOB: 30-Sep-1980, 43 y.o.   MRN: [id number]   HPI 43 y.o. presents today for dysuria, possible hematuria, and pelvic discomfort. Initial symptoms started a week ago, hematuria the last couple of days. Increased water intake and took AZO. Denies vaginal symptoms.   No LMP recorded. (Menstrual status: IUD).    Review of Systems  Constitutional:  Positive for fatigue. Negative for chills and fever.  Genitourinary:  Positive for dysuria, frequency, hematuria, pelvic pain and urgency. Negative for difficulty urinating and flank pain.       Objective:    Physical Exam Constitutional:      Appearance: Normal appearance.  Abdominal:     Tenderness: There is no right CVA tenderness or left CVA tenderness.   GU: Not indicated  BP 126/82   Pulse (!) 110   SpO2 97%  Wt Readings from Last 3 Encounters:  02/18/23 182 lb (82.6 kg)  10/29/22 160 lb 15 oz (73 kg)  08/19/22 161 lb (73 kg)        UA: 3+ leukocytes, neg nitrites, 1+ blood, 1+ protein, yellow/cloudy. Microscopic: wbc 40-60, rbc 10-20, moderate bacteria  Assessment & Plan:   Problem List Items Addressed This Visit   None Visit Diagnoses       Acute cystitis with hematuria    -  Primary   Relevant Medications   nitrofurantoin, macrocrystal-monohydrate, (MACROBID) 100 MG capsule     Dysuria       Relevant Orders   Urinalysis,Complete w/RFL Culture      Plan: Acute cystitis - Macrobid 100 mg BID x 7 days. Continue water intake, AZO as needed. Culture pending.  Return if symptoms worsen or fail to improve.    Olivia Mackie DNP, 11:17 AM 06/01/2023

## 2023-06-03 ENCOUNTER — Encounter: Payer: Self-pay | Admitting: Nurse Practitioner

## 2023-06-03 DIAGNOSIS — F603 Borderline personality disorder: Secondary | ICD-10-CM | POA: Diagnosis not present

## 2023-06-03 DIAGNOSIS — F449 Dissociative and conversion disorder, unspecified: Secondary | ICD-10-CM | POA: Diagnosis not present

## 2023-06-03 DIAGNOSIS — F4312 Post-traumatic stress disorder, chronic: Secondary | ICD-10-CM | POA: Diagnosis not present

## 2023-06-03 LAB — URINALYSIS, COMPLETE W/RFL CULTURE
Bilirubin Urine: NEGATIVE
Glucose, UA: NEGATIVE
Hyaline Cast: NONE SEEN /LPF
Ketones, ur: NEGATIVE
Nitrites, Initial: NEGATIVE
Specific Gravity, Urine: 1.025 (ref 1.001–1.035)
pH: 6 (ref 5.0–8.0)

## 2023-06-03 LAB — URINE CULTURE
MICRO NUMBER:: 16209188
SPECIMEN QUALITY:: ADEQUATE

## 2023-06-03 LAB — CULTURE INDICATED

## 2023-06-04 DIAGNOSIS — F449 Dissociative and conversion disorder, unspecified: Secondary | ICD-10-CM | POA: Diagnosis not present

## 2023-06-04 DIAGNOSIS — F603 Borderline personality disorder: Secondary | ICD-10-CM | POA: Diagnosis not present

## 2023-06-04 DIAGNOSIS — F4312 Post-traumatic stress disorder, chronic: Secondary | ICD-10-CM | POA: Diagnosis not present

## 2023-06-08 DIAGNOSIS — F449 Dissociative and conversion disorder, unspecified: Secondary | ICD-10-CM | POA: Diagnosis not present

## 2023-06-08 DIAGNOSIS — F603 Borderline personality disorder: Secondary | ICD-10-CM | POA: Diagnosis not present

## 2023-06-08 DIAGNOSIS — F4312 Post-traumatic stress disorder, chronic: Secondary | ICD-10-CM | POA: Diagnosis not present

## 2023-06-11 ENCOUNTER — Ambulatory Visit: Admitting: Nurse Practitioner

## 2023-06-11 ENCOUNTER — Encounter: Payer: Self-pay | Admitting: Nurse Practitioner

## 2023-06-11 VITALS — BP 128/74 | HR 106

## 2023-06-11 DIAGNOSIS — N898 Other specified noninflammatory disorders of vagina: Secondary | ICD-10-CM | POA: Diagnosis not present

## 2023-06-11 DIAGNOSIS — F449 Dissociative and conversion disorder, unspecified: Secondary | ICD-10-CM | POA: Diagnosis not present

## 2023-06-11 DIAGNOSIS — R300 Dysuria: Secondary | ICD-10-CM | POA: Diagnosis not present

## 2023-06-11 DIAGNOSIS — F4312 Post-traumatic stress disorder, chronic: Secondary | ICD-10-CM | POA: Diagnosis not present

## 2023-06-11 DIAGNOSIS — F603 Borderline personality disorder: Secondary | ICD-10-CM | POA: Diagnosis not present

## 2023-06-11 DIAGNOSIS — B3731 Acute candidiasis of vulva and vagina: Secondary | ICD-10-CM

## 2023-06-11 LAB — WET PREP FOR TRICH, YEAST, CLUE

## 2023-06-11 MED ORDER — FLUCONAZOLE 150 MG PO TABS: 150.0000 mg | ORAL_TABLET | ORAL | 0 refills | Status: DC

## 2023-06-11 NOTE — Progress Notes (Addendum)
   Acute Office Visit  Subjective:    Patient ID: Abigail Higgins, female    DOB: September 24, 1980, 43 y.o.   MRN: [id number]   HPI 43 y.o. presents today for vulvar burning, discharge and odor. Treated for UTI 06/01/23. Felt symptoms did resolve.   No LMP recorded. (Menstrual status: IUD).    Review of Systems  Constitutional: Negative.   Genitourinary:  Positive for dysuria, vaginal discharge and vaginal pain. Negative for difficulty urinating, flank pain, frequency, hematuria, pelvic pain and urgency.       Objective:    Physical Exam Constitutional:      Appearance: Normal appearance.  Genitourinary:    General: Normal vulva.     Vagina: Vaginal discharge and erythema present.     Cervix: Normal.     BP 128/74 (BP Location: Right Arm, Patient Position: Sitting, Cuff Size: Small)   Pulse (!) 106   SpO2 96%  Wt Readings from Last 3 Encounters:  02/18/23 182 lb (82.6 kg)  10/29/22 160 lb 15 oz (73 kg)  08/19/22 161 lb (73 kg)        Patient informed chaperone available to be present for breast and/or pelvic exam. Patient has requested no chaperone to be present. Patient has been advised what will be completed during breast and pelvic exam.   Wet prep + yeast UA: 1+ leukocytes, neg nitrites, neg blood, dark yellow/cloudy. Microscopic: wbc 6-10, rbc none, moderate bacteria  Assessment & Plan:   Problem List Items Addressed This Visit   None Visit Diagnoses       Vaginal candidiasis    -  Primary   Relevant Medications   fluconazole (DIFLUCAN) 150 MG tablet     Dysuria       Relevant Orders   Urinalysis,Complete w/RFL Culture (Completed)     Vaginal discharge       Relevant Orders   Urinalysis,Complete w/RFL Culture (Completed)   WET PREP FOR TRICH, YEAST, CLUE      Plan: Wet prep positive for yeast - Diflucan 150 mg every 3 days x 2 doses. Small amount of leukocytes in urine, likely contaminated or from vaginal infection. Will wait on culture.   Return if  symptoms worsen or fail to improve.    Olivia Mackie DNP, 2:35 PM 06/11/2023

## 2023-06-11 NOTE — Addendum Note (Signed)
 Addended byWyline Beady on: 06/11/2023 02:35 PM   Modules accepted: Orders

## 2023-06-13 LAB — URINALYSIS, COMPLETE W/RFL CULTURE
Bilirubin Urine: NEGATIVE
Glucose, UA: NEGATIVE
Hgb urine dipstick: NEGATIVE
Hyaline Cast: NONE SEEN /LPF
Ketones, ur: NEGATIVE
Nitrites, Initial: NEGATIVE
RBC / HPF: NONE SEEN /HPF (ref 0–2)
Specific Gravity, Urine: 1.025 (ref 1.001–1.035)
pH: 7 (ref 5.0–8.0)

## 2023-06-13 LAB — URINE CULTURE
MICRO NUMBER:: 16255731
Result:: NO GROWTH
SPECIMEN QUALITY:: ADEQUATE

## 2023-06-13 LAB — CULTURE INDICATED

## 2023-06-15 ENCOUNTER — Other Ambulatory Visit: Payer: Self-pay | Admitting: Nurse Practitioner

## 2023-06-15 ENCOUNTER — Encounter: Payer: Self-pay | Admitting: Nurse Practitioner

## 2023-06-15 DIAGNOSIS — F603 Borderline personality disorder: Secondary | ICD-10-CM | POA: Diagnosis not present

## 2023-06-15 DIAGNOSIS — F449 Dissociative and conversion disorder, unspecified: Secondary | ICD-10-CM | POA: Diagnosis not present

## 2023-06-15 DIAGNOSIS — N3281 Overactive bladder: Secondary | ICD-10-CM

## 2023-06-15 DIAGNOSIS — F4312 Post-traumatic stress disorder, chronic: Secondary | ICD-10-CM | POA: Diagnosis not present

## 2023-06-15 NOTE — Telephone Encounter (Signed)
 Medication refill request: myrbetriq 50mg  Last OV: 06-11-23 Last AEX:  02-18-23 Next AEX: not scheduled Last MMG (if hormonal medication request): n/a Refill authorized: please approve or deny as appropriate

## 2023-06-24 ENCOUNTER — Other Ambulatory Visit: Payer: Self-pay

## 2023-06-24 DIAGNOSIS — B3731 Acute candidiasis of vulva and vagina: Secondary | ICD-10-CM

## 2023-06-24 MED ORDER — FLUCONAZOLE 150 MG PO TABS: 150.0000 mg | ORAL_TABLET | ORAL | 0 refills | Status: DC

## 2023-06-24 NOTE — Telephone Encounter (Signed)
 Pt LVM in the med refill line requesting rx for diflucan. Stated that she was advised by TW if sxs reoccur of yeast.   AEX UTD. Rx pend.

## 2023-06-24 NOTE — Telephone Encounter (Signed)
 Pt notified and voiced understanding

## 2023-06-25 DIAGNOSIS — F449 Dissociative and conversion disorder, unspecified: Secondary | ICD-10-CM | POA: Diagnosis not present

## 2023-06-25 DIAGNOSIS — F603 Borderline personality disorder: Secondary | ICD-10-CM | POA: Diagnosis not present

## 2023-06-25 DIAGNOSIS — F4312 Post-traumatic stress disorder, chronic: Secondary | ICD-10-CM | POA: Diagnosis not present

## 2023-06-26 DIAGNOSIS — F900 Attention-deficit hyperactivity disorder, predominantly inattentive type: Secondary | ICD-10-CM | POA: Diagnosis not present

## 2023-06-26 DIAGNOSIS — F331 Major depressive disorder, recurrent, moderate: Secondary | ICD-10-CM | POA: Diagnosis not present

## 2023-06-26 DIAGNOSIS — F410 Panic disorder [episodic paroxysmal anxiety] without agoraphobia: Secondary | ICD-10-CM | POA: Diagnosis not present

## 2023-06-29 DIAGNOSIS — F4312 Post-traumatic stress disorder, chronic: Secondary | ICD-10-CM | POA: Diagnosis not present

## 2023-06-29 DIAGNOSIS — F449 Dissociative and conversion disorder, unspecified: Secondary | ICD-10-CM | POA: Diagnosis not present

## 2023-06-29 DIAGNOSIS — F603 Borderline personality disorder: Secondary | ICD-10-CM | POA: Diagnosis not present

## 2023-07-02 DIAGNOSIS — F449 Dissociative and conversion disorder, unspecified: Secondary | ICD-10-CM | POA: Diagnosis not present

## 2023-07-02 DIAGNOSIS — F4312 Post-traumatic stress disorder, chronic: Secondary | ICD-10-CM | POA: Diagnosis not present

## 2023-07-06 DIAGNOSIS — Z79899 Other long term (current) drug therapy: Secondary | ICD-10-CM | POA: Diagnosis not present

## 2023-07-06 DIAGNOSIS — F449 Dissociative and conversion disorder, unspecified: Secondary | ICD-10-CM | POA: Diagnosis not present

## 2023-07-06 DIAGNOSIS — L209 Atopic dermatitis, unspecified: Secondary | ICD-10-CM | POA: Diagnosis not present

## 2023-07-06 DIAGNOSIS — F4312 Post-traumatic stress disorder, chronic: Secondary | ICD-10-CM | POA: Diagnosis not present

## 2023-07-06 DIAGNOSIS — F603 Borderline personality disorder: Secondary | ICD-10-CM | POA: Diagnosis not present

## 2023-07-09 DIAGNOSIS — F449 Dissociative and conversion disorder, unspecified: Secondary | ICD-10-CM | POA: Diagnosis not present

## 2023-07-09 DIAGNOSIS — F4312 Post-traumatic stress disorder, chronic: Secondary | ICD-10-CM | POA: Diagnosis not present

## 2023-07-09 DIAGNOSIS — F603 Borderline personality disorder: Secondary | ICD-10-CM | POA: Diagnosis not present

## 2023-07-17 ENCOUNTER — Other Ambulatory Visit: Payer: Self-pay | Admitting: Nurse Practitioner

## 2023-07-17 DIAGNOSIS — Z1231 Encounter for screening mammogram for malignant neoplasm of breast: Secondary | ICD-10-CM

## 2023-07-20 DIAGNOSIS — F4312 Post-traumatic stress disorder, chronic: Secondary | ICD-10-CM | POA: Diagnosis not present

## 2023-07-20 DIAGNOSIS — F449 Dissociative and conversion disorder, unspecified: Secondary | ICD-10-CM | POA: Diagnosis not present

## 2023-07-20 DIAGNOSIS — F603 Borderline personality disorder: Secondary | ICD-10-CM | POA: Diagnosis not present

## 2023-07-23 DIAGNOSIS — F449 Dissociative and conversion disorder, unspecified: Secondary | ICD-10-CM | POA: Diagnosis not present

## 2023-07-23 DIAGNOSIS — F4312 Post-traumatic stress disorder, chronic: Secondary | ICD-10-CM | POA: Diagnosis not present

## 2023-07-23 DIAGNOSIS — F603 Borderline personality disorder: Secondary | ICD-10-CM | POA: Diagnosis not present

## 2023-07-24 DIAGNOSIS — F3341 Major depressive disorder, recurrent, in partial remission: Secondary | ICD-10-CM | POA: Diagnosis not present

## 2023-07-24 DIAGNOSIS — F410 Panic disorder [episodic paroxysmal anxiety] without agoraphobia: Secondary | ICD-10-CM | POA: Diagnosis not present

## 2023-07-24 DIAGNOSIS — F900 Attention-deficit hyperactivity disorder, predominantly inattentive type: Secondary | ICD-10-CM | POA: Diagnosis not present

## 2023-07-27 DIAGNOSIS — F4312 Post-traumatic stress disorder, chronic: Secondary | ICD-10-CM | POA: Diagnosis not present

## 2023-07-27 DIAGNOSIS — F603 Borderline personality disorder: Secondary | ICD-10-CM | POA: Diagnosis not present

## 2023-07-27 DIAGNOSIS — F449 Dissociative and conversion disorder, unspecified: Secondary | ICD-10-CM | POA: Diagnosis not present

## 2023-07-30 DIAGNOSIS — F449 Dissociative and conversion disorder, unspecified: Secondary | ICD-10-CM | POA: Diagnosis not present

## 2023-07-30 DIAGNOSIS — F4312 Post-traumatic stress disorder, chronic: Secondary | ICD-10-CM | POA: Diagnosis not present

## 2023-07-30 DIAGNOSIS — F603 Borderline personality disorder: Secondary | ICD-10-CM | POA: Diagnosis not present

## 2023-07-31 DIAGNOSIS — F603 Borderline personality disorder: Secondary | ICD-10-CM | POA: Diagnosis not present

## 2023-07-31 DIAGNOSIS — F4312 Post-traumatic stress disorder, chronic: Secondary | ICD-10-CM | POA: Diagnosis not present

## 2023-07-31 DIAGNOSIS — F449 Dissociative and conversion disorder, unspecified: Secondary | ICD-10-CM | POA: Diagnosis not present

## 2023-08-03 DIAGNOSIS — F449 Dissociative and conversion disorder, unspecified: Secondary | ICD-10-CM | POA: Diagnosis not present

## 2023-08-03 DIAGNOSIS — F603 Borderline personality disorder: Secondary | ICD-10-CM | POA: Diagnosis not present

## 2023-08-03 DIAGNOSIS — F4312 Post-traumatic stress disorder, chronic: Secondary | ICD-10-CM | POA: Diagnosis not present

## 2023-08-05 ENCOUNTER — Ambulatory Visit
Admission: RE | Admit: 2023-08-05 | Discharge: 2023-08-05 | Disposition: A | Discharge status: Home / Self Care | Source: Ambulatory Visit | Attending: Nurse Practitioner | Admitting: Nurse Practitioner

## 2023-08-05 DIAGNOSIS — Z1231 Encounter for screening mammogram for malignant neoplasm of breast: Secondary | ICD-10-CM | POA: Diagnosis not present

## 2023-08-06 DIAGNOSIS — F603 Borderline personality disorder: Secondary | ICD-10-CM | POA: Diagnosis not present

## 2023-08-06 DIAGNOSIS — F449 Dissociative and conversion disorder, unspecified: Secondary | ICD-10-CM | POA: Diagnosis not present

## 2023-08-06 DIAGNOSIS — F4312 Post-traumatic stress disorder, chronic: Secondary | ICD-10-CM | POA: Diagnosis not present

## 2023-08-17 DIAGNOSIS — E66811 Obesity, class 1: Secondary | ICD-10-CM | POA: Diagnosis not present

## 2023-08-17 DIAGNOSIS — F603 Borderline personality disorder: Secondary | ICD-10-CM | POA: Diagnosis not present

## 2023-08-17 DIAGNOSIS — Z6832 Body mass index (BMI) 32.0-32.9, adult: Secondary | ICD-10-CM | POA: Diagnosis not present

## 2023-08-17 DIAGNOSIS — E785 Hyperlipidemia, unspecified: Secondary | ICD-10-CM | POA: Diagnosis not present

## 2023-08-17 DIAGNOSIS — R635 Abnormal weight gain: Secondary | ICD-10-CM | POA: Diagnosis not present

## 2023-08-17 DIAGNOSIS — F4312 Post-traumatic stress disorder, chronic: Secondary | ICD-10-CM | POA: Diagnosis not present

## 2023-08-17 DIAGNOSIS — E559 Vitamin D deficiency, unspecified: Secondary | ICD-10-CM | POA: Diagnosis not present

## 2023-08-17 DIAGNOSIS — I10 Essential (primary) hypertension: Secondary | ICD-10-CM | POA: Diagnosis not present

## 2023-08-17 DIAGNOSIS — F449 Dissociative and conversion disorder, unspecified: Secondary | ICD-10-CM | POA: Diagnosis not present

## 2023-08-20 DIAGNOSIS — F4312 Post-traumatic stress disorder, chronic: Secondary | ICD-10-CM | POA: Diagnosis not present

## 2023-08-20 DIAGNOSIS — F449 Dissociative and conversion disorder, unspecified: Secondary | ICD-10-CM | POA: Diagnosis not present

## 2023-08-20 DIAGNOSIS — F603 Borderline personality disorder: Secondary | ICD-10-CM | POA: Diagnosis not present

## 2023-08-24 DIAGNOSIS — F449 Dissociative and conversion disorder, unspecified: Secondary | ICD-10-CM | POA: Diagnosis not present

## 2023-08-24 DIAGNOSIS — F4312 Post-traumatic stress disorder, chronic: Secondary | ICD-10-CM | POA: Diagnosis not present

## 2023-08-24 DIAGNOSIS — F603 Borderline personality disorder: Secondary | ICD-10-CM | POA: Diagnosis not present

## 2023-09-08 DIAGNOSIS — F4312 Post-traumatic stress disorder, chronic: Secondary | ICD-10-CM | POA: Diagnosis not present

## 2023-09-09 ENCOUNTER — Other Ambulatory Visit: Payer: Self-pay | Admitting: Nurse Practitioner

## 2023-09-09 DIAGNOSIS — N3281 Overactive bladder: Secondary | ICD-10-CM

## 2023-09-09 NOTE — Telephone Encounter (Signed)
 Med refill request: Myrbetriq  50 mg Last AEX: 02/16/23 Next AEX: none scheduled Last MMG (if hormonal med) n/a Refill authorized: Please approve or deny as appropriate.

## 2023-09-11 DIAGNOSIS — F4312 Post-traumatic stress disorder, chronic: Secondary | ICD-10-CM | POA: Diagnosis not present

## 2023-09-14 DIAGNOSIS — F4312 Post-traumatic stress disorder, chronic: Secondary | ICD-10-CM | POA: Diagnosis not present

## 2023-09-17 DIAGNOSIS — F4312 Post-traumatic stress disorder, chronic: Secondary | ICD-10-CM | POA: Diagnosis not present

## 2023-09-21 DIAGNOSIS — F4312 Post-traumatic stress disorder, chronic: Secondary | ICD-10-CM | POA: Diagnosis not present

## 2023-09-24 DIAGNOSIS — F4312 Post-traumatic stress disorder, chronic: Secondary | ICD-10-CM | POA: Diagnosis not present

## 2023-10-01 DIAGNOSIS — F4312 Post-traumatic stress disorder, chronic: Secondary | ICD-10-CM | POA: Diagnosis not present

## 2023-10-10 DIAGNOSIS — F4312 Post-traumatic stress disorder, chronic: Secondary | ICD-10-CM | POA: Diagnosis not present

## 2023-10-15 DIAGNOSIS — F4312 Post-traumatic stress disorder, chronic: Secondary | ICD-10-CM | POA: Diagnosis not present

## 2023-10-22 DIAGNOSIS — F4312 Post-traumatic stress disorder, chronic: Secondary | ICD-10-CM | POA: Diagnosis not present

## 2023-10-26 DIAGNOSIS — F900 Attention-deficit hyperactivity disorder, predominantly inattentive type: Secondary | ICD-10-CM | POA: Diagnosis not present

## 2023-10-26 DIAGNOSIS — F410 Panic disorder [episodic paroxysmal anxiety] without agoraphobia: Secondary | ICD-10-CM | POA: Diagnosis not present

## 2023-10-26 DIAGNOSIS — F3342 Major depressive disorder, recurrent, in full remission: Secondary | ICD-10-CM | POA: Diagnosis not present

## 2023-10-29 DIAGNOSIS — F4312 Post-traumatic stress disorder, chronic: Secondary | ICD-10-CM | POA: Diagnosis not present

## 2023-11-05 DIAGNOSIS — F4312 Post-traumatic stress disorder, chronic: Secondary | ICD-10-CM | POA: Diagnosis not present

## 2023-11-06 DIAGNOSIS — Z79899 Other long term (current) drug therapy: Secondary | ICD-10-CM | POA: Diagnosis not present

## 2023-11-06 DIAGNOSIS — L209 Atopic dermatitis, unspecified: Secondary | ICD-10-CM | POA: Diagnosis not present

## 2023-11-12 DIAGNOSIS — F4312 Post-traumatic stress disorder, chronic: Secondary | ICD-10-CM | POA: Diagnosis not present

## 2023-11-14 DIAGNOSIS — F4312 Post-traumatic stress disorder, chronic: Secondary | ICD-10-CM | POA: Diagnosis not present

## 2023-11-19 DIAGNOSIS — F4312 Post-traumatic stress disorder, chronic: Secondary | ICD-10-CM | POA: Diagnosis not present

## 2023-12-04 DIAGNOSIS — F4312 Post-traumatic stress disorder, chronic: Secondary | ICD-10-CM | POA: Diagnosis not present

## 2023-12-11 DIAGNOSIS — F4312 Post-traumatic stress disorder, chronic: Secondary | ICD-10-CM | POA: Diagnosis not present

## 2023-12-17 DIAGNOSIS — F4312 Post-traumatic stress disorder, chronic: Secondary | ICD-10-CM | POA: Diagnosis not present

## 2023-12-24 DIAGNOSIS — F410 Panic disorder [episodic paroxysmal anxiety] without agoraphobia: Secondary | ICD-10-CM | POA: Diagnosis not present

## 2023-12-24 DIAGNOSIS — F900 Attention-deficit hyperactivity disorder, predominantly inattentive type: Secondary | ICD-10-CM | POA: Diagnosis not present

## 2023-12-24 DIAGNOSIS — F3342 Major depressive disorder, recurrent, in full remission: Secondary | ICD-10-CM | POA: Diagnosis not present

## 2023-12-28 DIAGNOSIS — F4312 Post-traumatic stress disorder, chronic: Secondary | ICD-10-CM | POA: Diagnosis not present

## 2024-01-01 DIAGNOSIS — F4312 Post-traumatic stress disorder, chronic: Secondary | ICD-10-CM | POA: Diagnosis not present

## 2024-01-04 DIAGNOSIS — F4312 Post-traumatic stress disorder, chronic: Secondary | ICD-10-CM | POA: Diagnosis not present

## 2024-01-07 DIAGNOSIS — F4312 Post-traumatic stress disorder, chronic: Secondary | ICD-10-CM | POA: Diagnosis not present

## 2024-01-11 DIAGNOSIS — F4312 Post-traumatic stress disorder, chronic: Secondary | ICD-10-CM | POA: Diagnosis not present

## 2024-01-18 DIAGNOSIS — F4312 Post-traumatic stress disorder, chronic: Secondary | ICD-10-CM | POA: Diagnosis not present

## 2024-01-21 DIAGNOSIS — F4312 Post-traumatic stress disorder, chronic: Secondary | ICD-10-CM | POA: Diagnosis not present

## 2024-01-25 DIAGNOSIS — F4312 Post-traumatic stress disorder, chronic: Secondary | ICD-10-CM | POA: Diagnosis not present

## 2024-02-04 DIAGNOSIS — F4312 Post-traumatic stress disorder, chronic: Secondary | ICD-10-CM | POA: Diagnosis not present

## 2024-02-08 DIAGNOSIS — F4312 Post-traumatic stress disorder, chronic: Secondary | ICD-10-CM | POA: Diagnosis not present

## 2024-02-15 DIAGNOSIS — F4312 Post-traumatic stress disorder, chronic: Secondary | ICD-10-CM | POA: Diagnosis not present

## 2024-02-22 DIAGNOSIS — F4312 Post-traumatic stress disorder, chronic: Secondary | ICD-10-CM | POA: Diagnosis not present

## 2024-02-23 NOTE — Progress Notes (Unsigned)
 Abigail Higgins 1980/08/04 [id number]   History:  43 y.o. G0 presents for annual exam. Mirena  IUD inserted 02/2017, amenorrheic. History of CIN-1 years ago. Depression/anxiety, bipolar disorder managed by psychiatry. Sees therapist. HTN managed by PCP, GI managing constipation, on Linzess. Was doing well on Myrbetriq  for OAB.   Gynecologic History No LMP recorded. (Menstrual status: IUD).   Contraception: IUD Sexually active: Yes  Health maintenance Last Pap: 02/16/2019. Results were: Normal neg HPV Last mammogram: 08/05/2023. Results were: Normal Last Colonoscopy: 2024. Results were: Normal Last Dexa: Not indicated     02/18/2023    2:54 PM  Depression screen PHQ 2/9  Decreased Interest 3  Down, Depressed, Hopeless 3  PHQ - 2 Score 6  Altered sleeping 1  Tired, decreased energy 3  Change in appetite 3  Feeling bad or failure about yourself  3  Trouble concentrating 3  Moving slowly or fidgety/restless 3  Suicidal thoughts 3  PHQ-9 Score 25   Difficult doing work/chores Extremely dIfficult     Data saved with a previous flowsheet row definition     Past medical history, past surgical history, family history and social history were all reviewed and documented in the EPIC chart. Married. Working on disability. Has stepdaughter in [place] who has 6 and 25 yo girls.   ROS:  A ROS was performed and pertinent positives and negatives are included.  Exam:  There were no vitals filed for this visit.    There is no height or weight on file to calculate BMI.  General appearance:  Normal Thyroid :  Symmetrical, normal in size, without palpable masses or nodularity. Respiratory  Auscultation:  Clear without wheezing or rhonchi Cardiovascular  Auscultation:  Regular rate, without rubs, murmurs or gallops  Edema/varicosities:  Not grossly evident Abdominal  Soft,nontender, without masses, guarding or rebound.  Liver/spleen:  No organomegaly noted  Hernia:  None  appreciated  Skin  Inspection:  Grossly normal   Breasts: Examined lying and sitting.   Right: Without masses, retractions, discharge or axillary adenopathy.   Left: Without masses, retractions, discharge or axillary adenopathy. Pelvic: External genitalia:  no lesions              Urethra:  normal appearing urethra with no masses, tenderness or lesions              Bartholins and Skenes: normal                 Vagina: normal appearing vagina with normal color and discharge, no lesions              Cervix: no lesions. IUD string visible Bimanual Exam:  Uterus:  no masses or tenderness              Adnexa: no mass, fullness, tenderness              Rectovaginal: Deferred              Anus:  normal, no lesions  Patient informed chaperone available to be present for breast and pelvic exam. Patient has requested no chaperone to be present. Patient has been advised what will be completed during breast and pelvic exam.   Assessment/Plan:  43 y.o. G0 for annual exam.   Well female exam with routine gynecological exam - Education provided on SBEs, importance of preventative screenings, current guidelines, high calcium diet, regular exercise, and multivitamin daily. Labs with PCP.   Encounter for routine checking of intrauterine contraceptive device (IUD) -  Mirena  inserted 02/2017, amenorrheic. She is aware for 8-year FDA approval. Will check hormone levels next fall prior to expiration.   Positive screening for depression on 9-item Patient Health Questionnaire (PHQ-9). PHQ-9 of 25. Depression and anxiety managed by psychiatry, also sees therapist. No thoughts of self harm.   OAB (overactive bladder) - Plan: mirabegron  ER (MYRBETRIQ ) 25 MG TB24 tablet daily. Good management.   Screening for cervical cancer - CIN-1 years ago, normal paps since. Pap today per guidelines.   Screening for breast cancer - Normal mammogram history.  Continue annual screenings. Normal breast exam today.  Screening  for colon cancer - Colonoscopy this year. Will repeat at GI's recommended interval.   No follow-ups on file.       Abigail Higgins, 2:26 PM 02/23/2024

## 2024-02-24 ENCOUNTER — Other Ambulatory Visit (HOSPITAL_COMMUNITY)
Admission: RE | Admit: 2024-02-24 | Discharge: 2024-02-24 | Disposition: A | Discharge status: Home / Self Care | Source: Ambulatory Visit | Attending: Nurse Practitioner | Admitting: Nurse Practitioner

## 2024-02-24 ENCOUNTER — Ambulatory Visit: Admitting: Nurse Practitioner

## 2024-02-24 ENCOUNTER — Encounter: Payer: Self-pay | Admitting: Nurse Practitioner

## 2024-02-24 VITALS — BP 110/70 | Temp 99.0°F | Resp 16 | Ht 65.25 in | Wt 198.0 lb

## 2024-02-24 DIAGNOSIS — N912 Amenorrhea, unspecified: Secondary | ICD-10-CM

## 2024-02-24 DIAGNOSIS — Z30431 Encounter for routine checking of intrauterine contraceptive device: Secondary | ICD-10-CM

## 2024-02-24 DIAGNOSIS — Z124 Encounter for screening for malignant neoplasm of cervix: Secondary | ICD-10-CM

## 2024-02-24 DIAGNOSIS — N3281 Overactive bladder: Secondary | ICD-10-CM

## 2024-02-24 DIAGNOSIS — Z01419 Encounter for gynecological examination (general) (routine) without abnormal findings: Secondary | ICD-10-CM

## 2024-02-24 DIAGNOSIS — N3000 Acute cystitis without hematuria: Secondary | ICD-10-CM | POA: Diagnosis not present

## 2024-02-24 DIAGNOSIS — Z1331 Encounter for screening for depression: Secondary | ICD-10-CM

## 2024-02-24 DIAGNOSIS — R350 Frequency of micturition: Secondary | ICD-10-CM | POA: Diagnosis not present

## 2024-02-24 MED ORDER — SULFAMETHOXAZOLE-TRIMETHOPRIM 800-160 MG PO TABS: 1.0000 | ORAL_TABLET | Freq: Two times a day (BID) | ORAL | 0 refills | Status: AC

## 2024-02-27 LAB — URINALYSIS, COMPLETE W/RFL CULTURE
Bilirubin Urine: NEGATIVE
Glucose, UA: NEGATIVE
Hyaline Cast: NONE SEEN /LPF
Ketones, ur: NEGATIVE
Nitrites, Initial: NEGATIVE
Specific Gravity, Urine: 1.015 (ref 1.001–1.035)
pH: 7 (ref 5.0–8.0)

## 2024-02-27 LAB — URINE CULTURE
MICRO NUMBER:: 17337651
SPECIMEN QUALITY:: ADEQUATE

## 2024-02-27 LAB — CULTURE INDICATED

## 2024-02-28 ENCOUNTER — Ambulatory Visit: Payer: Self-pay | Admitting: Nurse Practitioner

## 2024-02-29 DIAGNOSIS — F4312 Post-traumatic stress disorder, chronic: Secondary | ICD-10-CM | POA: Diagnosis not present

## 2024-02-29 LAB — CYTOLOGY - PAP
Comment: NEGATIVE
Diagnosis: NEGATIVE
High risk HPV: NEGATIVE

## 2024-03-07 DIAGNOSIS — F4312 Post-traumatic stress disorder, chronic: Secondary | ICD-10-CM | POA: Diagnosis not present

## 2024-04-09 ENCOUNTER — Other Ambulatory Visit (HOSPITAL_COMMUNITY): Payer: Self-pay
